# Patient Record
Sex: Female | Born: 1940 | Race: White | Hispanic: No | Smoking: Never smoker
Health system: Southern US, Community
[De-identification: ages and names within clinical notes are randomized; demographics above are authoritative.]

## PROBLEM LIST (undated history)

## (undated) DIAGNOSIS — J189 Pneumonia, unspecified organism: Secondary | ICD-10-CM

## (undated) DIAGNOSIS — J42 Unspecified chronic bronchitis: Secondary | ICD-10-CM

## (undated) DIAGNOSIS — M1990 Unspecified osteoarthritis, unspecified site: Secondary | ICD-10-CM

## (undated) DIAGNOSIS — E7800 Pure hypercholesterolemia, unspecified: Secondary | ICD-10-CM

## (undated) DIAGNOSIS — J45909 Unspecified asthma, uncomplicated: Secondary | ICD-10-CM

## (undated) DIAGNOSIS — K219 Gastro-esophageal reflux disease without esophagitis: Secondary | ICD-10-CM

## (undated) DIAGNOSIS — J3089 Other allergic rhinitis: Secondary | ICD-10-CM

## (undated) DIAGNOSIS — E119 Type 2 diabetes mellitus without complications: Secondary | ICD-10-CM

## (undated) DIAGNOSIS — I10 Essential (primary) hypertension: Secondary | ICD-10-CM

## (undated) HISTORY — PX: BLADDER SUSPENSION: SHX72

## (undated) HISTORY — PX: COLONOSCOPY: SHX174

## (undated) HISTORY — PX: JOINT REPLACEMENT: SHX530

## (undated) HISTORY — PX: ABDOMINAL HYSTERECTOMY: SHX81

## (undated) HISTORY — PX: SHOULDER ARTHROSCOPY W/ ROTATOR CUFF REPAIR: SHX2400

---

## 1998-06-30 ENCOUNTER — Ambulatory Visit (HOSPITAL_COMMUNITY): Admission: RE | Admit: 1998-06-30 | Discharge: 1998-06-30 | Payer: Self-pay | Admitting: Family Medicine

## 2000-07-04 ENCOUNTER — Encounter: Payer: Self-pay | Admitting: Family Medicine

## 2000-07-04 ENCOUNTER — Ambulatory Visit (HOSPITAL_COMMUNITY): Admission: RE | Admit: 2000-07-04 | Discharge: 2000-07-04 | Payer: Self-pay | Admitting: Family Medicine

## 2000-07-12 ENCOUNTER — Encounter: Admission: RE | Admit: 2000-07-12 | Discharge: 2000-07-12 | Payer: Self-pay | Admitting: Family Medicine

## 2000-07-12 ENCOUNTER — Encounter: Payer: Self-pay | Admitting: Family Medicine

## 2000-08-22 ENCOUNTER — Encounter: Payer: Self-pay | Admitting: Obstetrics and Gynecology

## 2000-08-22 ENCOUNTER — Ambulatory Visit (HOSPITAL_COMMUNITY): Admission: RE | Admit: 2000-08-22 | Discharge: 2000-08-22 | Payer: Self-pay | Admitting: Obstetrics and Gynecology

## 2001-04-12 ENCOUNTER — Emergency Department (HOSPITAL_COMMUNITY): Admission: EM | Admit: 2001-04-12 | Discharge: 2001-04-12 | Payer: Self-pay | Admitting: Emergency Medicine

## 2001-05-21 ENCOUNTER — Encounter: Payer: Self-pay | Admitting: Family Medicine

## 2001-05-21 ENCOUNTER — Ambulatory Visit (HOSPITAL_COMMUNITY): Admission: RE | Admit: 2001-05-21 | Discharge: 2001-05-21 | Payer: Self-pay | Admitting: Family Medicine

## 2001-09-12 ENCOUNTER — Encounter: Payer: Self-pay | Admitting: Family Medicine

## 2001-09-12 ENCOUNTER — Ambulatory Visit (HOSPITAL_COMMUNITY): Admission: RE | Admit: 2001-09-12 | Discharge: 2001-09-12 | Payer: Self-pay | Admitting: Family Medicine

## 2002-02-25 ENCOUNTER — Ambulatory Visit (HOSPITAL_COMMUNITY): Admission: RE | Admit: 2002-02-25 | Discharge: 2002-02-25 | Payer: Self-pay | Admitting: Family Medicine

## 2002-02-25 ENCOUNTER — Encounter: Payer: Self-pay | Admitting: Family Medicine

## 2002-10-07 ENCOUNTER — Ambulatory Visit (HOSPITAL_BASED_OUTPATIENT_CLINIC_OR_DEPARTMENT_OTHER): Admission: RE | Admit: 2002-10-07 | Discharge: 2002-10-07 | Payer: Self-pay | Admitting: Orthopaedic Surgery

## 2003-11-10 ENCOUNTER — Other Ambulatory Visit: Admission: RE | Admit: 2003-11-10 | Discharge: 2003-11-10 | Payer: Self-pay | Admitting: Family Medicine

## 2003-12-23 ENCOUNTER — Encounter: Admission: RE | Admit: 2003-12-23 | Discharge: 2003-12-23 | Payer: Self-pay | Admitting: Family Medicine

## 2004-01-06 ENCOUNTER — Ambulatory Visit (HOSPITAL_COMMUNITY): Admission: RE | Admit: 2004-01-06 | Discharge: 2004-01-06 | Payer: Self-pay | Admitting: Gastroenterology

## 2004-01-06 ENCOUNTER — Encounter (INDEPENDENT_AMBULATORY_CARE_PROVIDER_SITE_OTHER): Payer: Self-pay | Admitting: *Deleted

## 2004-10-30 ENCOUNTER — Ambulatory Visit (HOSPITAL_COMMUNITY): Admission: RE | Admit: 2004-10-30 | Discharge: 2004-10-30 | Payer: Self-pay | Admitting: Allergy

## 2004-10-30 IMAGING — CR DG CHEST 2V
2 series · 2 of 2 positions shown · non-contrast
Comparison: Report dated 09/12/2001.

CLINICAL DATA: Cough, shortness of breath, hypertension.

CHEST - 2 VIEW

[view not recorded (1 of 2)]
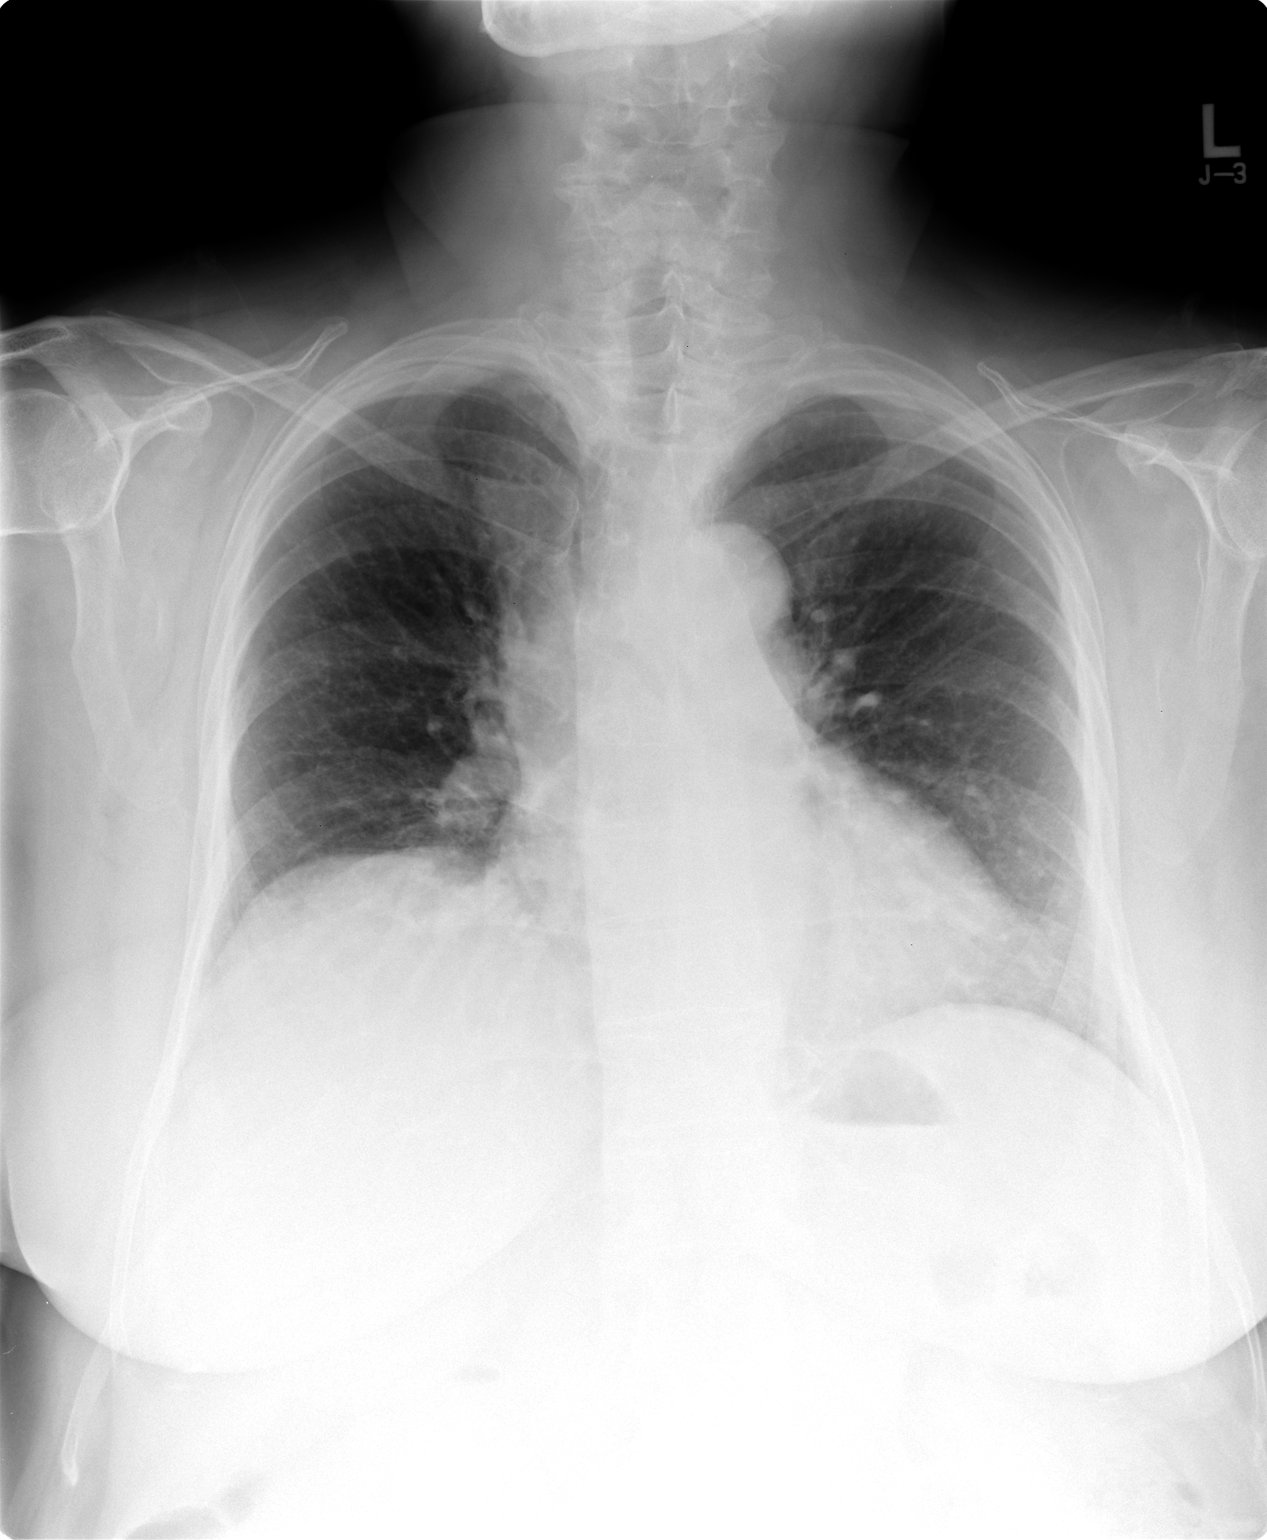

[view not recorded (2 of 2)]
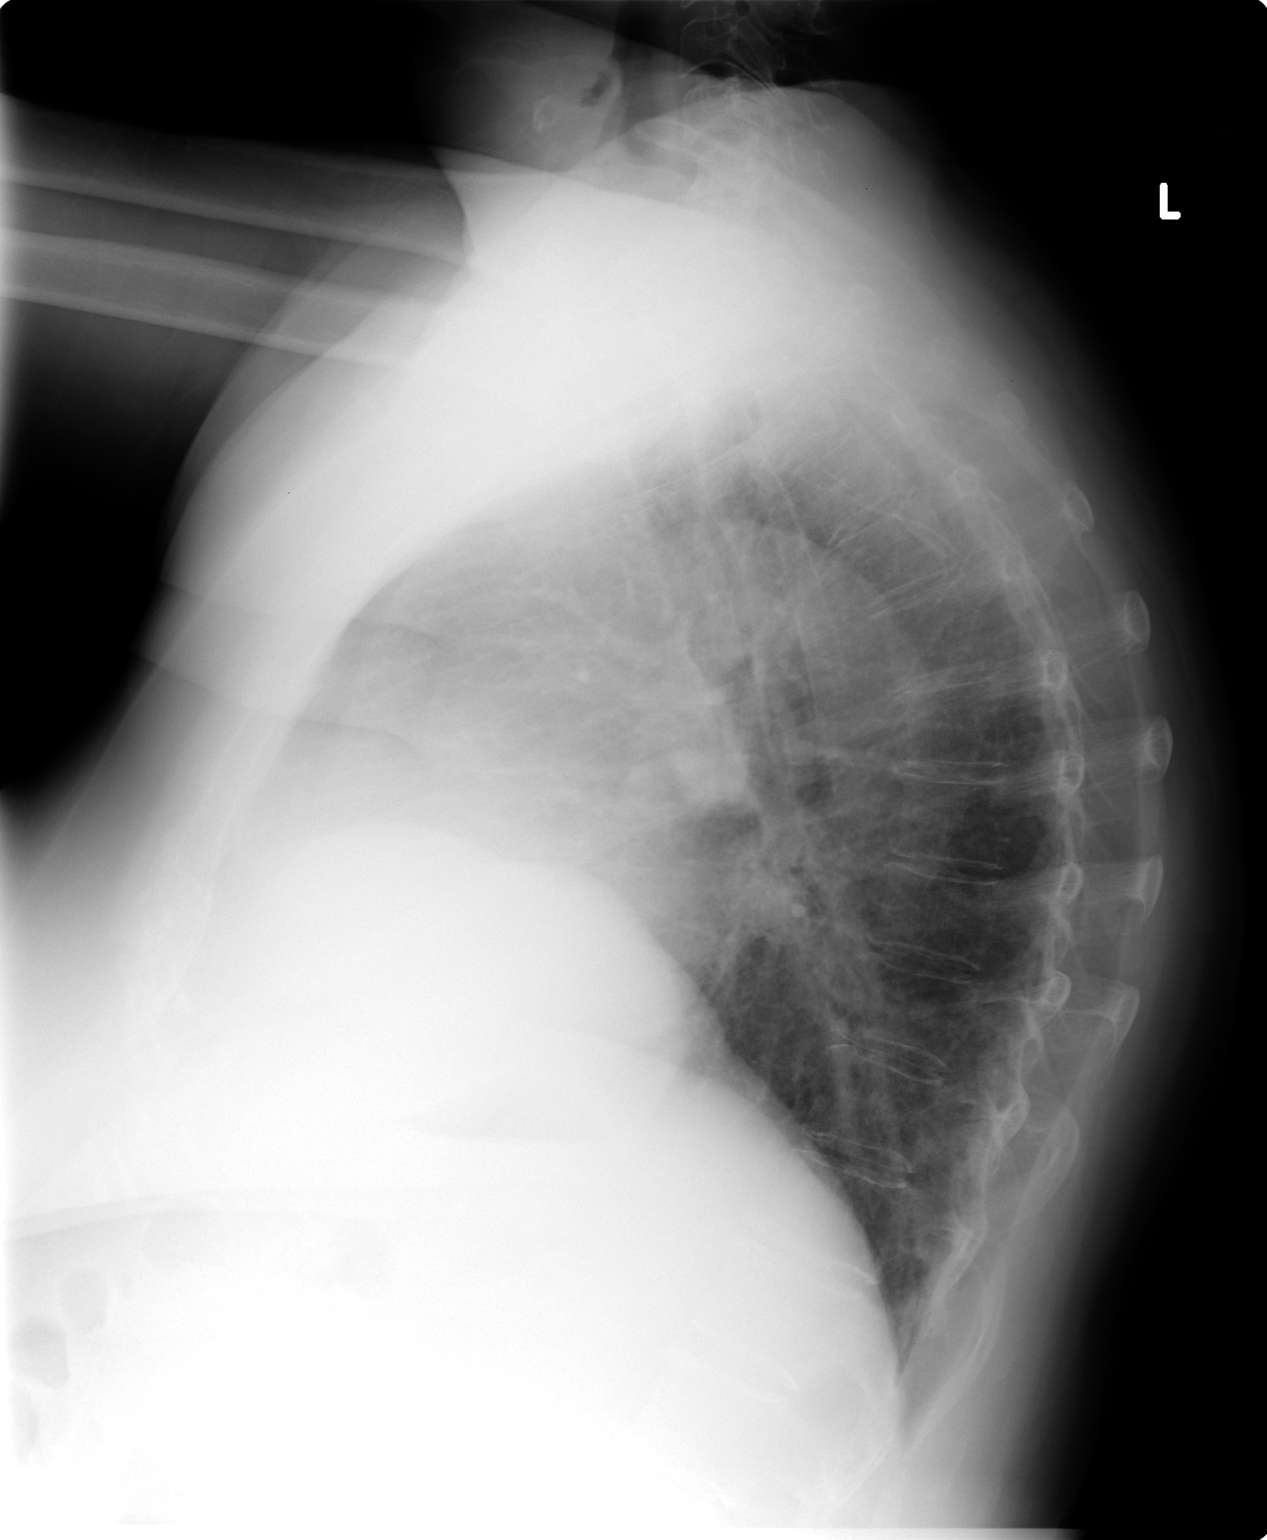

[2 of 2 positions shown; findings below may reference images not displayed]

FINDINGS: Mildly enlarged cardiac silhouette. Right anterior diaphragmatic
eventration. Diffuse peribronchial thickening and accentuation of the
interstitial markings. Diffuse osteopenia.

IMPRESSION

1. Mild cardiomegaly.

2. Mild bronchitic changes and mild chronic interstitial lung disease.

## 2004-11-15 ENCOUNTER — Emergency Department (HOSPITAL_COMMUNITY): Admission: EM | Admit: 2004-11-15 | Discharge: 2004-11-15 | Payer: Self-pay | Admitting: Emergency Medicine

## 2004-11-15 IMAGING — CR DG CHEST 2V
2 series · 2 of 2 positions shown · non-contrast
Comparison: 10/30/04.

CLINICAL DATA: Chest pain radiating to left arm.  Hypertension.  Shortness of breath.
 CHEST ? TWO VIEW:

[view not recorded (1 of 2)]
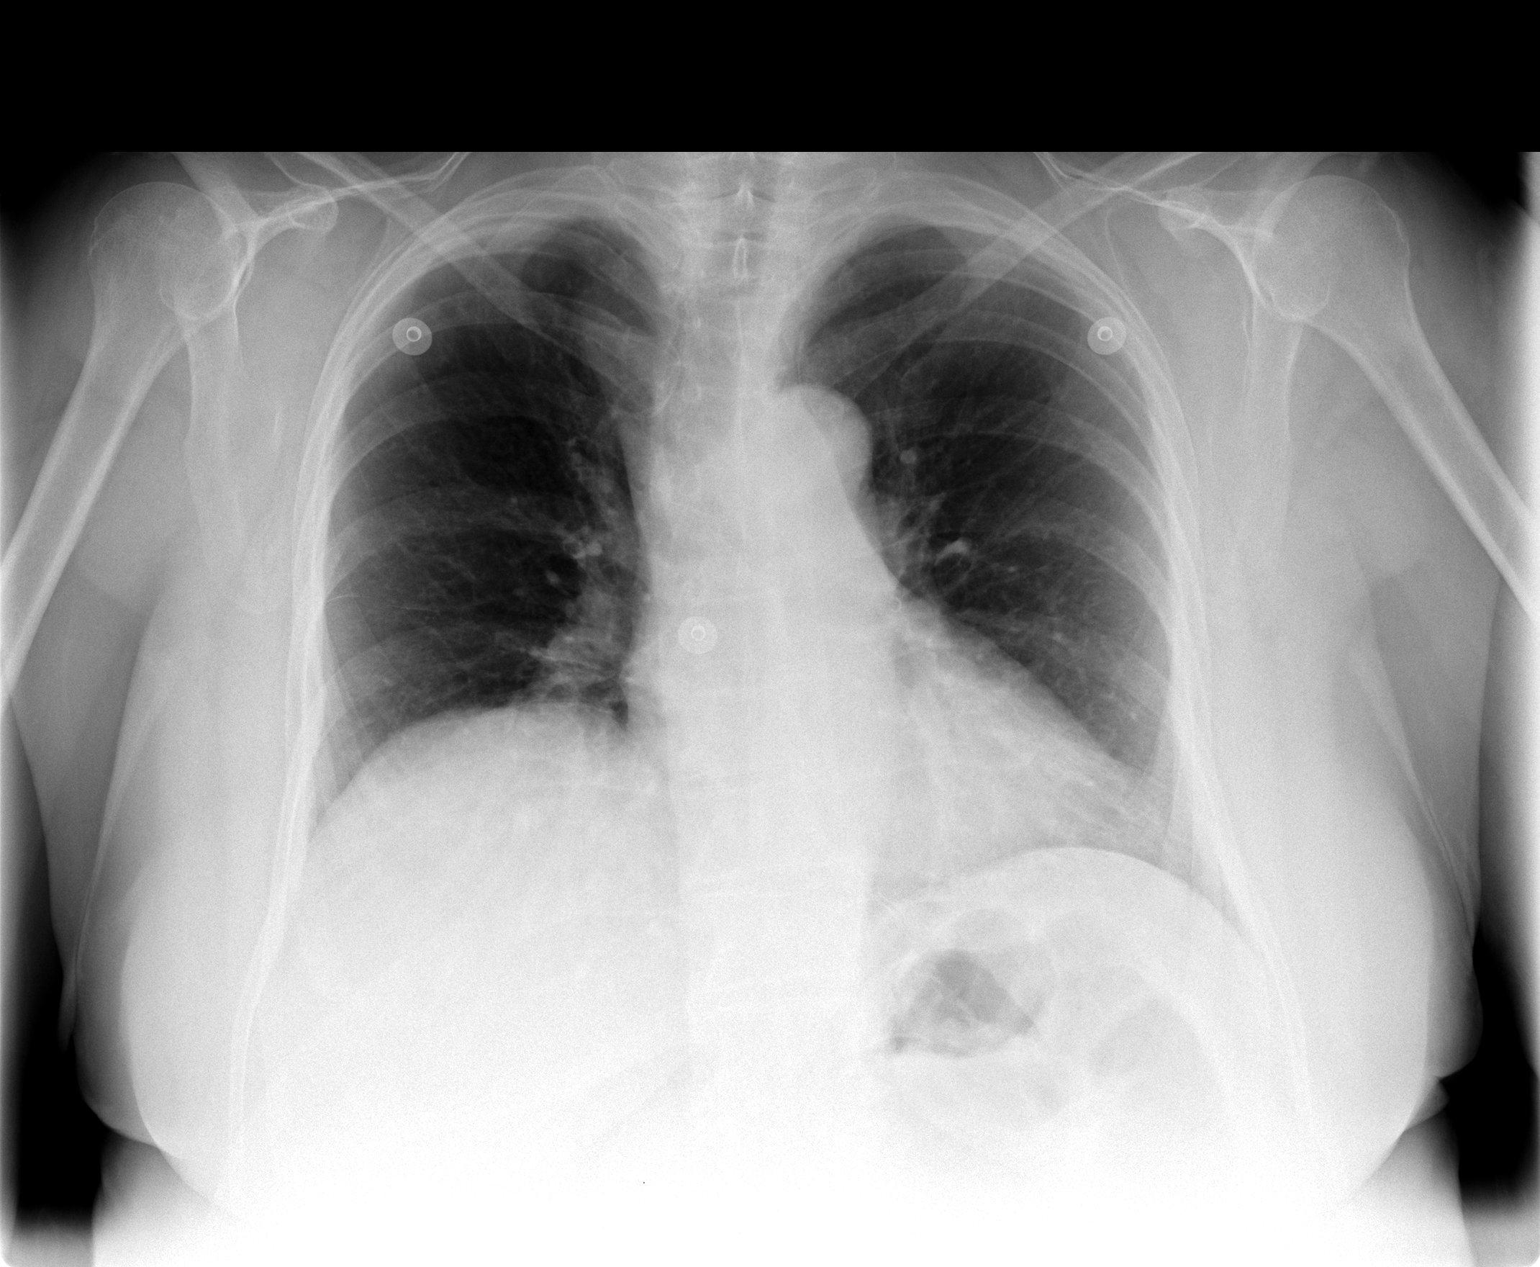

[view not recorded (2 of 2)]
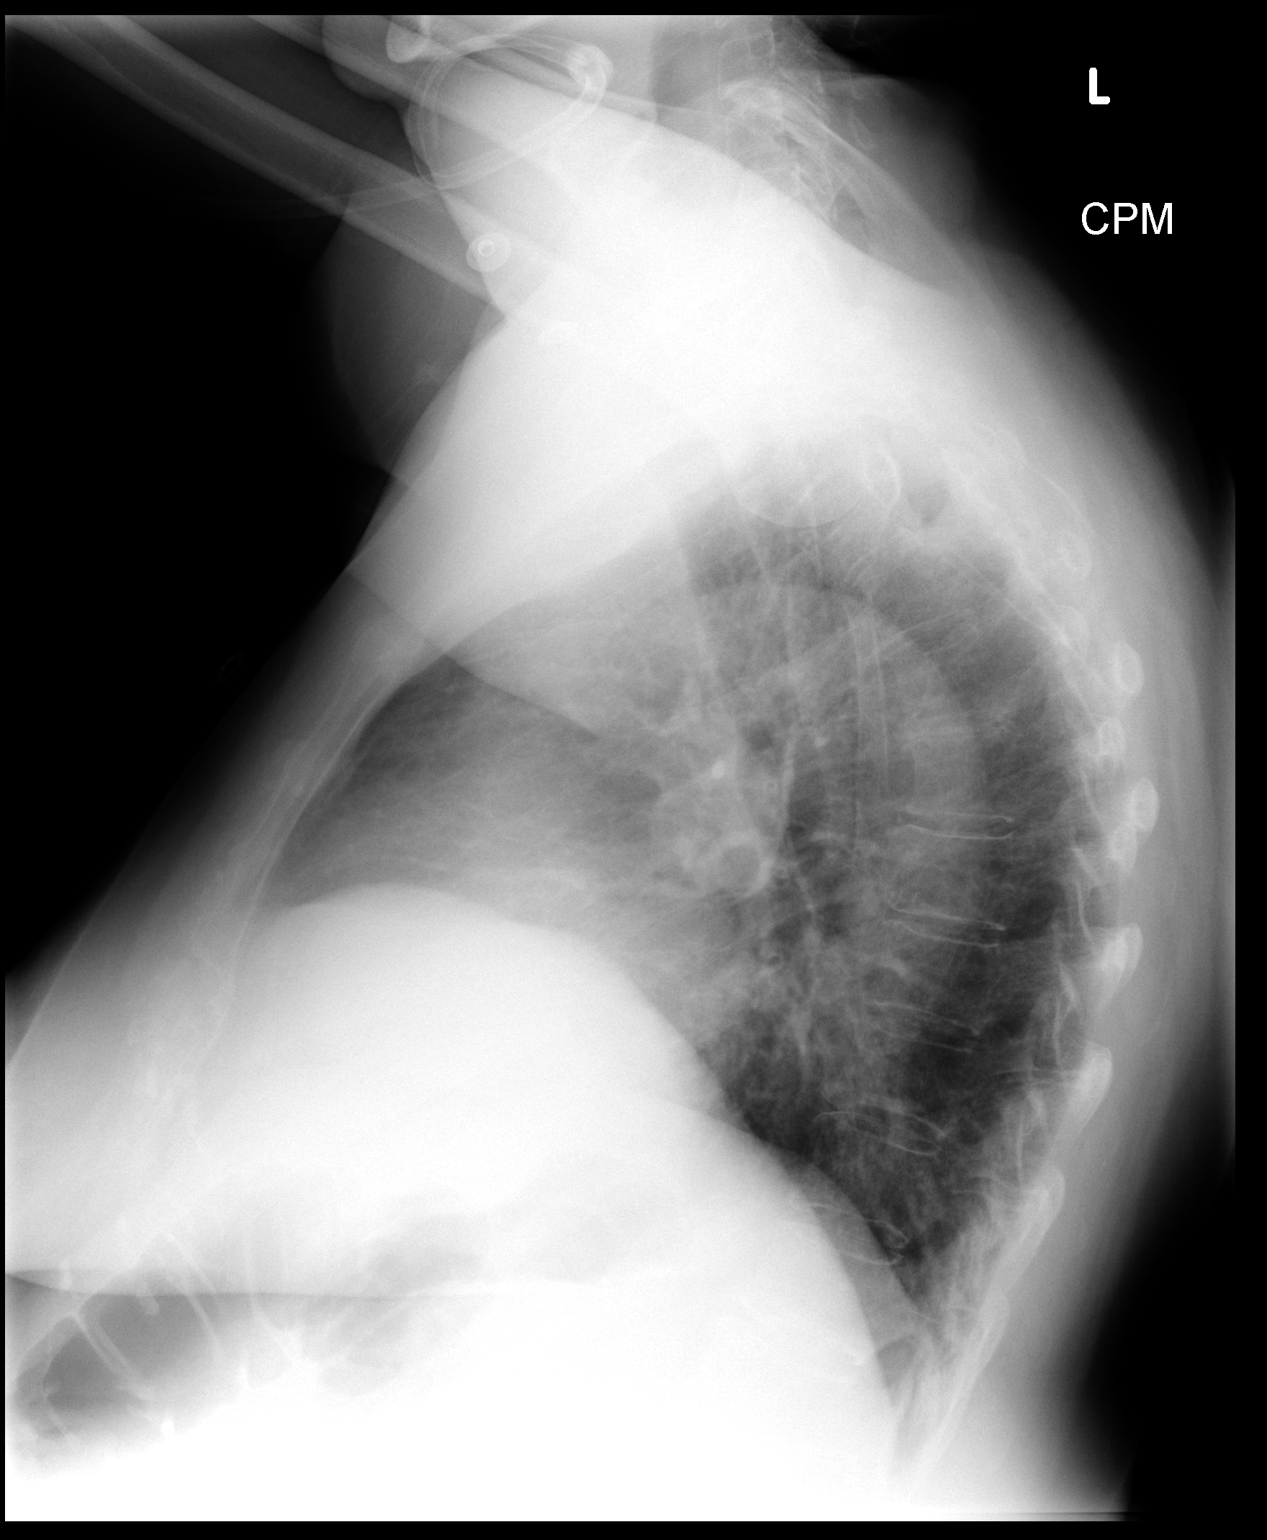

[2 of 2 positions shown; findings below may reference images not displayed]

Low lung volumes are seen, however both lungs are clear.  Heart size and mediastinal contours are within normal limits.  No significant change is seen since prior study.
IMPRESSION: Stable chest.  No active disease.

## 2004-11-28 ENCOUNTER — Ambulatory Visit: Payer: Self-pay | Admitting: Internal Medicine

## 2004-12-06 ENCOUNTER — Ambulatory Visit: Payer: Self-pay | Admitting: Internal Medicine

## 2005-01-17 ENCOUNTER — Ambulatory Visit (HOSPITAL_COMMUNITY): Admission: RE | Admit: 2005-01-17 | Discharge: 2005-01-17 | Payer: Self-pay | Admitting: Internal Medicine

## 2005-01-17 ENCOUNTER — Ambulatory Visit: Payer: Self-pay | Admitting: Internal Medicine

## 2005-05-05 ENCOUNTER — Other Ambulatory Visit: Admission: RE | Admit: 2005-05-05 | Discharge: 2005-05-05 | Payer: Self-pay | Admitting: Family Medicine

## 2005-06-18 ENCOUNTER — Emergency Department (HOSPITAL_COMMUNITY): Admission: EM | Admit: 2005-06-18 | Discharge: 2005-06-18 | Payer: Self-pay | Admitting: Emergency Medicine

## 2005-06-18 IMAGING — CR DG CHEST 1V PORT
1 series · 1 of 1 positions shown · non-contrast
Comparison: none

HISTORY: Dyspnea

PORTABLE CHEST ONE VIEW:
Portable exam 2121 hours compared to 11/15/2004
Mild cardiac enlargement.
Normal mediastinal contours and vascularity.
Mild chronic bronchitic changes.
No infiltrate, failure, or effusion.
Bones unremarkable.
No pneumothorax.

[view not recorded]
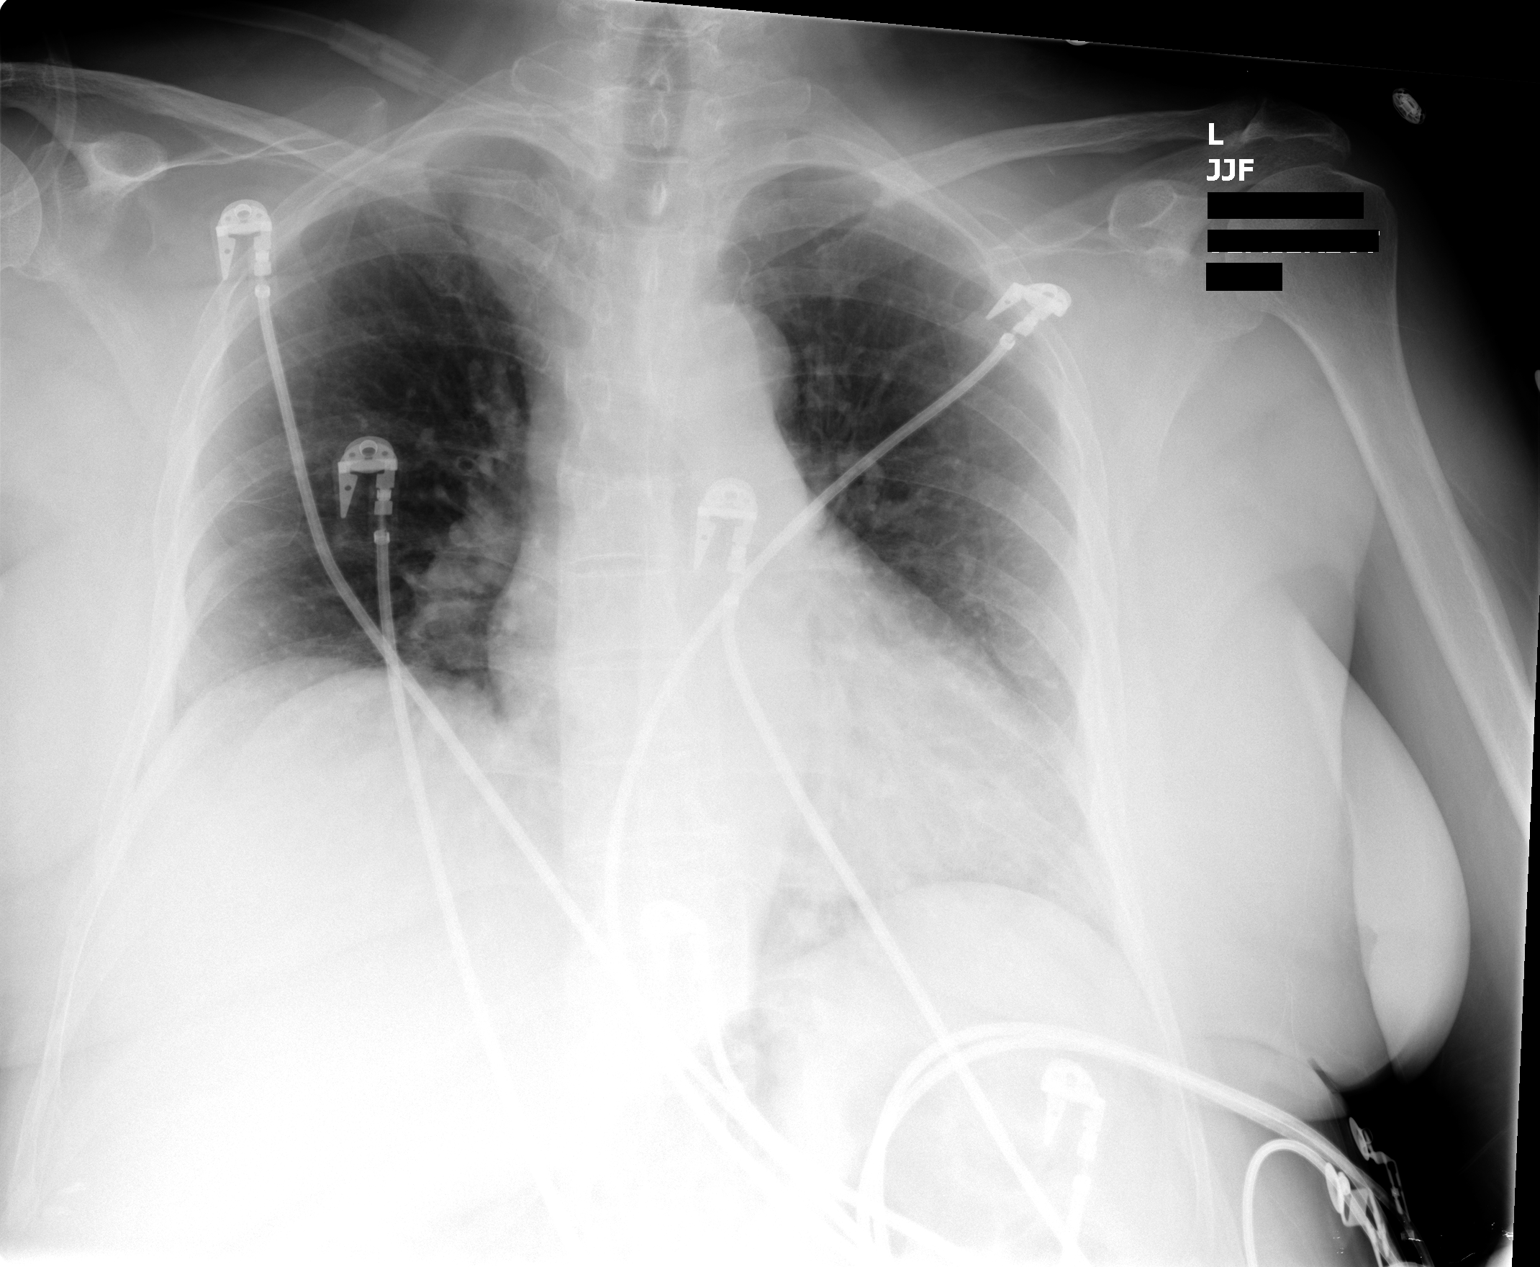

[1 of 1 positions shown; findings below may reference images not displayed]

IMPRESSION: Cardiomegaly with mild chronic bronchitic changes.

## 2005-06-20 ENCOUNTER — Ambulatory Visit: Payer: Self-pay | Admitting: Internal Medicine

## 2005-06-21 ENCOUNTER — Ambulatory Visit: Payer: Self-pay | Admitting: Cardiology

## 2005-06-21 IMAGING — CT CT PARANASAL SINUSES LIMITED
1 series · 16 of 18 positions shown, 20 images · non-contrast
Comparison: none

CLINICAL DATA: Cough, headache.
LIMITED CT OF PARANASAL SINUSES:
TECHNIQUE: oronal CT images were obtained through the paranasal sinuses without intravenous contrast.

[Series 3: ltdsinus 3.0 h30s · axial · 0.26mm/px · z∈[-131,-41]mm · 16 of 18 slices shown, 20 images]
[im 2/18  brain]
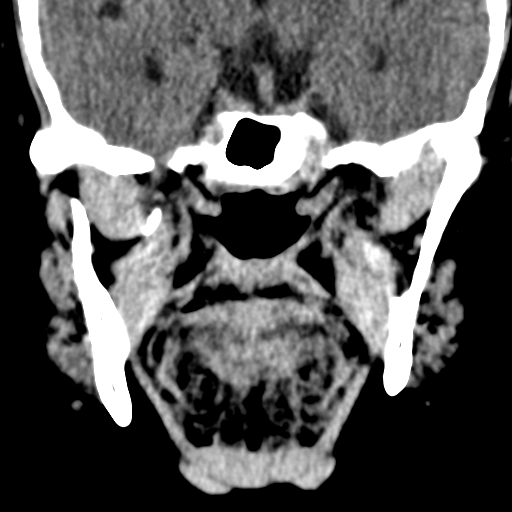
[im 2/18  bone]
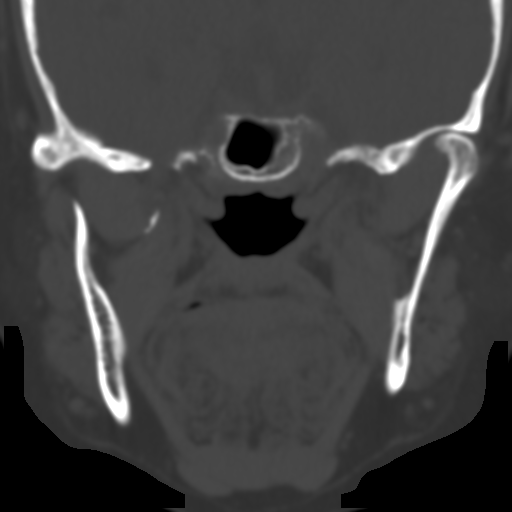
[im 3/18  bone]
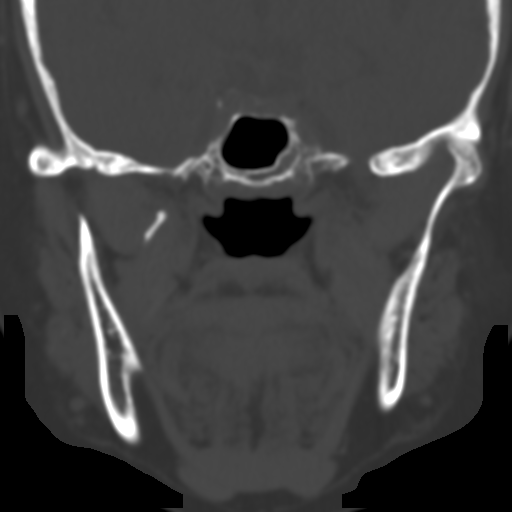
[im 4/18  bone]
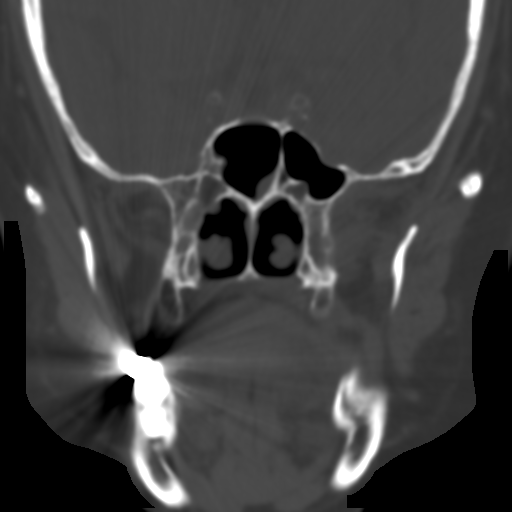
[im 5/18  bone]
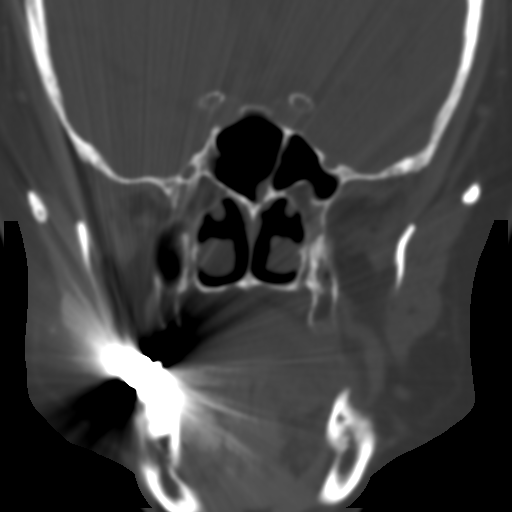
[im 6/18  brain]
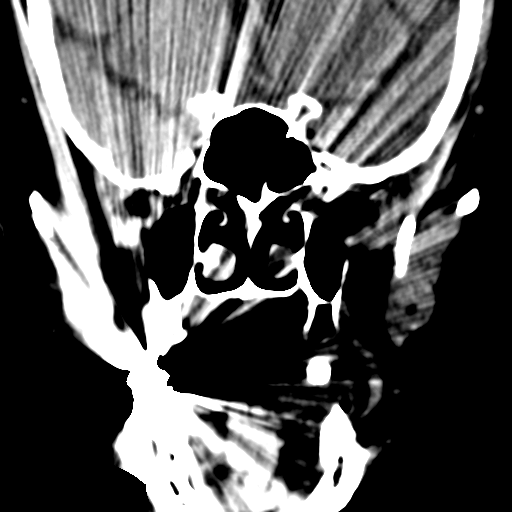
[im 6/18  bone]
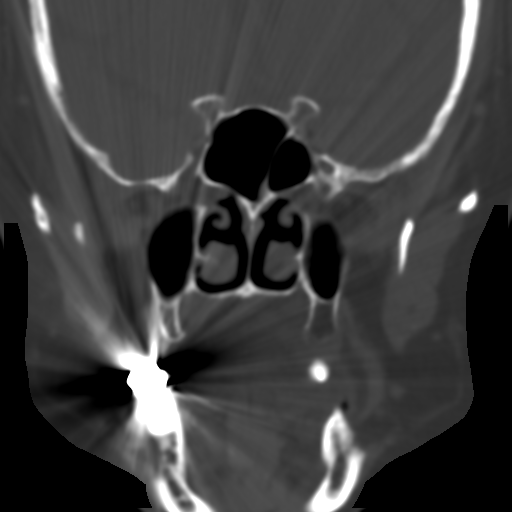
[im 7/18  bone]
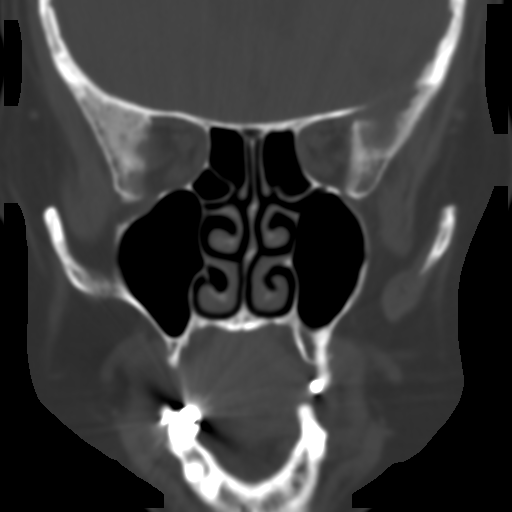
[im 8/18  bone]
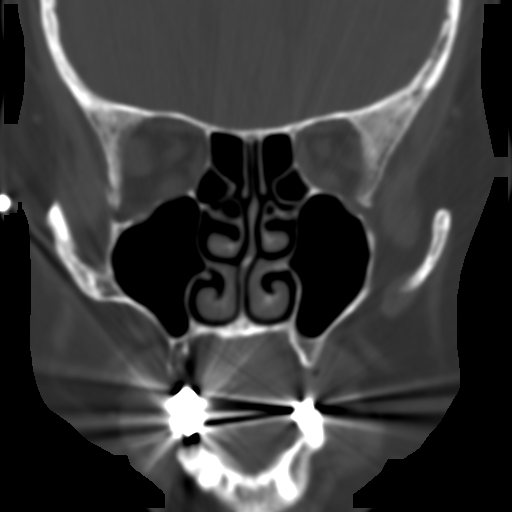
[im 9/18  bone]
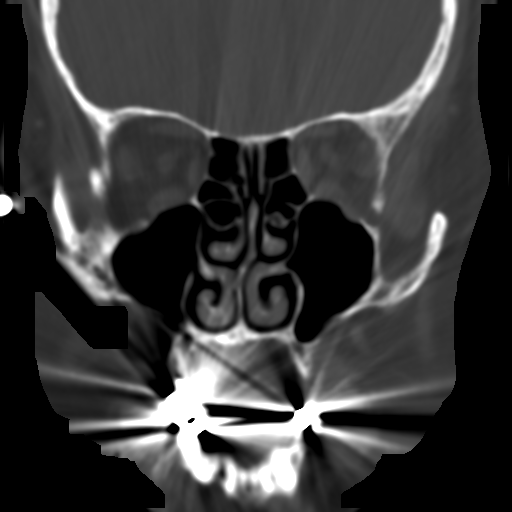
[im 10/18  brain]
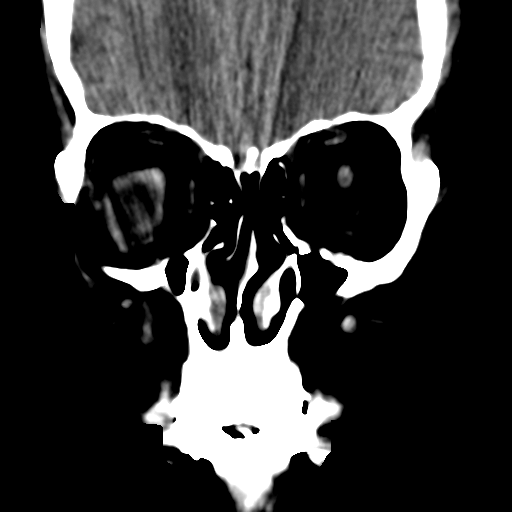
[im 10/18  bone]
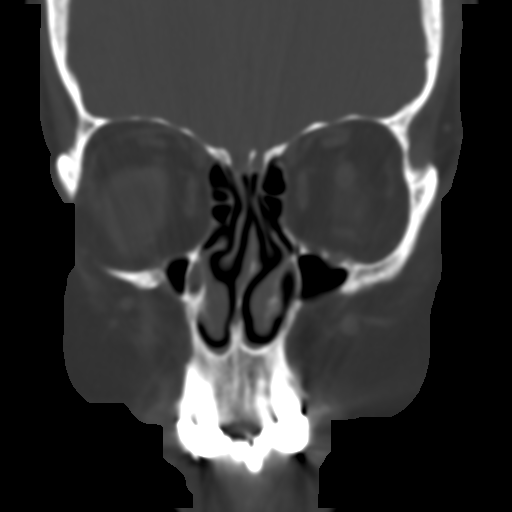
[im 11/18  bone]
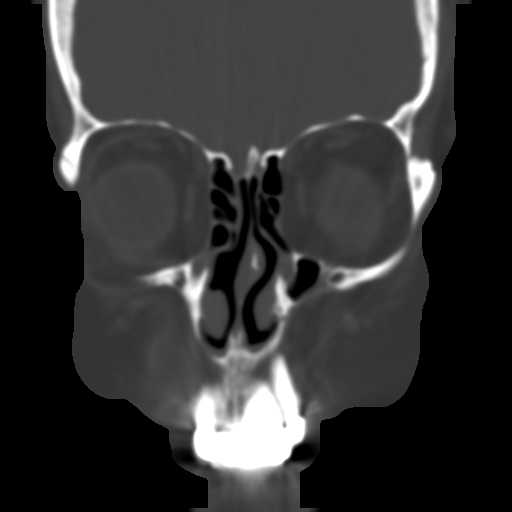
[im 12/18  bone]
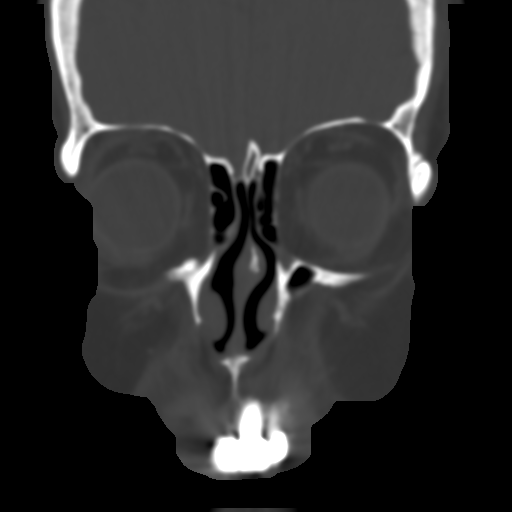
[im 13/18  bone]
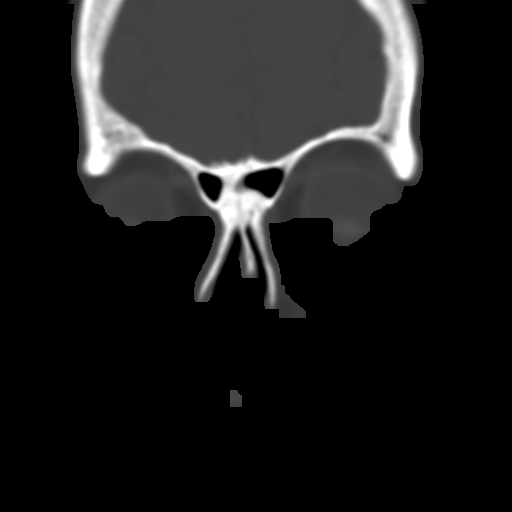
[im 14/18  brain]
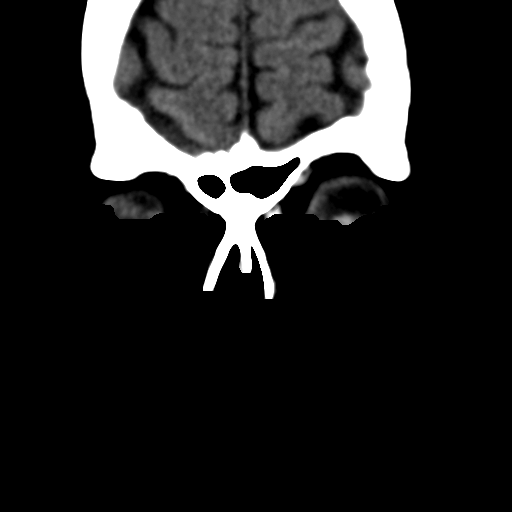
[im 14/18  bone]
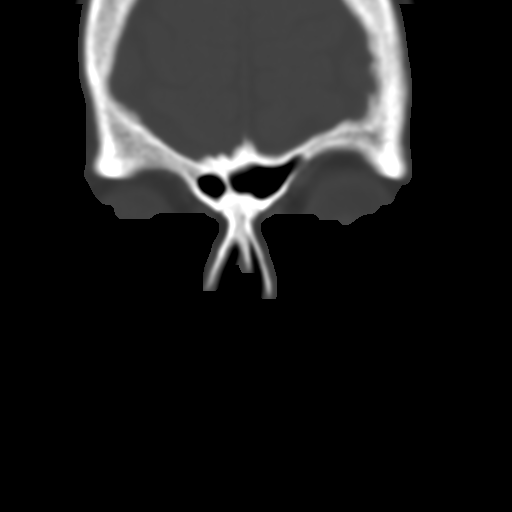
[im 15/18  bone]
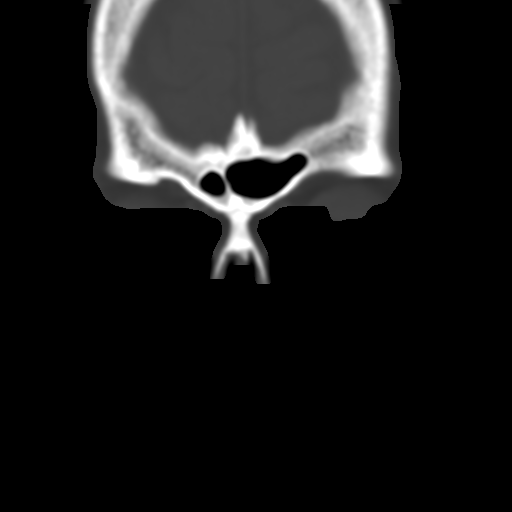
[im 16/18  bone]
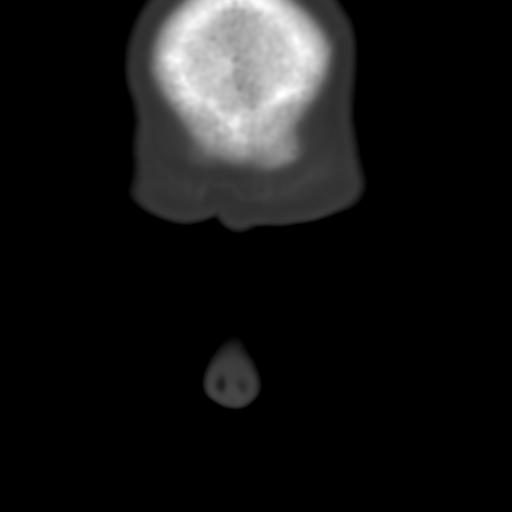
[im 17/18  bone]
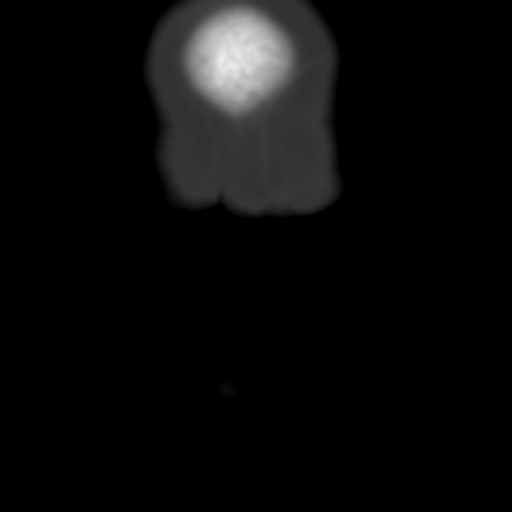

[16 of 18 positions shown; findings below may reference images not displayed]

FINDINGS: Mild focal mucosal thickening of the sphenoid sinus.  No paranasal sinus opacification or air-fluid level.  Intact bony sinus walls.  Nasal cavity unremarkable.  Turbinates symmetrical.  Slight nasal septal deviation.  Ostiomeatal units grossly patent.  Small frontal sinuses.
IMPRESSION: Slight sphenoid sinus mucosal thickening.  See comments above.

## 2005-06-26 ENCOUNTER — Ambulatory Visit: Payer: Self-pay | Admitting: Pulmonary Disease

## 2005-07-03 ENCOUNTER — Ambulatory Visit: Payer: Self-pay | Admitting: Internal Medicine

## 2005-07-03 ENCOUNTER — Ambulatory Visit: Admission: RE | Admit: 2005-07-03 | Discharge: 2005-07-03 | Payer: Self-pay | Admitting: Internal Medicine

## 2005-07-11 ENCOUNTER — Ambulatory Visit: Payer: Self-pay | Admitting: Internal Medicine

## 2005-07-18 ENCOUNTER — Ambulatory Visit: Payer: Self-pay | Admitting: Internal Medicine

## 2005-08-03 ENCOUNTER — Ambulatory Visit: Payer: Self-pay | Admitting: Internal Medicine

## 2005-08-21 ENCOUNTER — Ambulatory Visit: Payer: Self-pay | Admitting: Internal Medicine

## 2005-09-04 ENCOUNTER — Ambulatory Visit: Payer: Self-pay | Admitting: Internal Medicine

## 2005-09-19 ENCOUNTER — Ambulatory Visit: Payer: Self-pay | Admitting: Pulmonary Disease

## 2005-09-26 ENCOUNTER — Ambulatory Visit (HOSPITAL_COMMUNITY): Admission: RE | Admit: 2005-09-26 | Discharge: 2005-09-26 | Payer: Self-pay | Admitting: Internal Medicine

## 2005-10-17 ENCOUNTER — Ambulatory Visit: Payer: Self-pay | Admitting: Internal Medicine

## 2005-11-16 ENCOUNTER — Ambulatory Visit: Payer: Self-pay | Admitting: Internal Medicine

## 2005-12-07 ENCOUNTER — Ambulatory Visit: Payer: Self-pay | Admitting: Internal Medicine

## 2005-12-29 ENCOUNTER — Ambulatory Visit: Payer: Self-pay | Admitting: Internal Medicine

## 2006-01-30 ENCOUNTER — Ambulatory Visit: Payer: Self-pay | Admitting: Internal Medicine

## 2006-03-30 ENCOUNTER — Ambulatory Visit: Payer: Self-pay | Admitting: Internal Medicine

## 2006-05-07 ENCOUNTER — Other Ambulatory Visit: Admission: RE | Admit: 2006-05-07 | Discharge: 2006-05-07 | Payer: Self-pay | Admitting: Family Medicine

## 2006-05-30 ENCOUNTER — Encounter: Admission: RE | Admit: 2006-05-30 | Discharge: 2006-05-30 | Payer: Self-pay | Admitting: Family Medicine

## 2007-11-22 ENCOUNTER — Emergency Department (HOSPITAL_COMMUNITY): Admission: EM | Admit: 2007-11-22 | Discharge: 2007-11-22 | Payer: Self-pay | Admitting: Emergency Medicine

## 2007-11-22 IMAGING — CR DG CHEST 2V
2 series · 2 of 2 positions shown · non-contrast
Comparison: Chest, 11/22/07.

CLINICAL DATA: Cough and congestion.  
 CHEST - 2 VIEW:

[w chest pa]
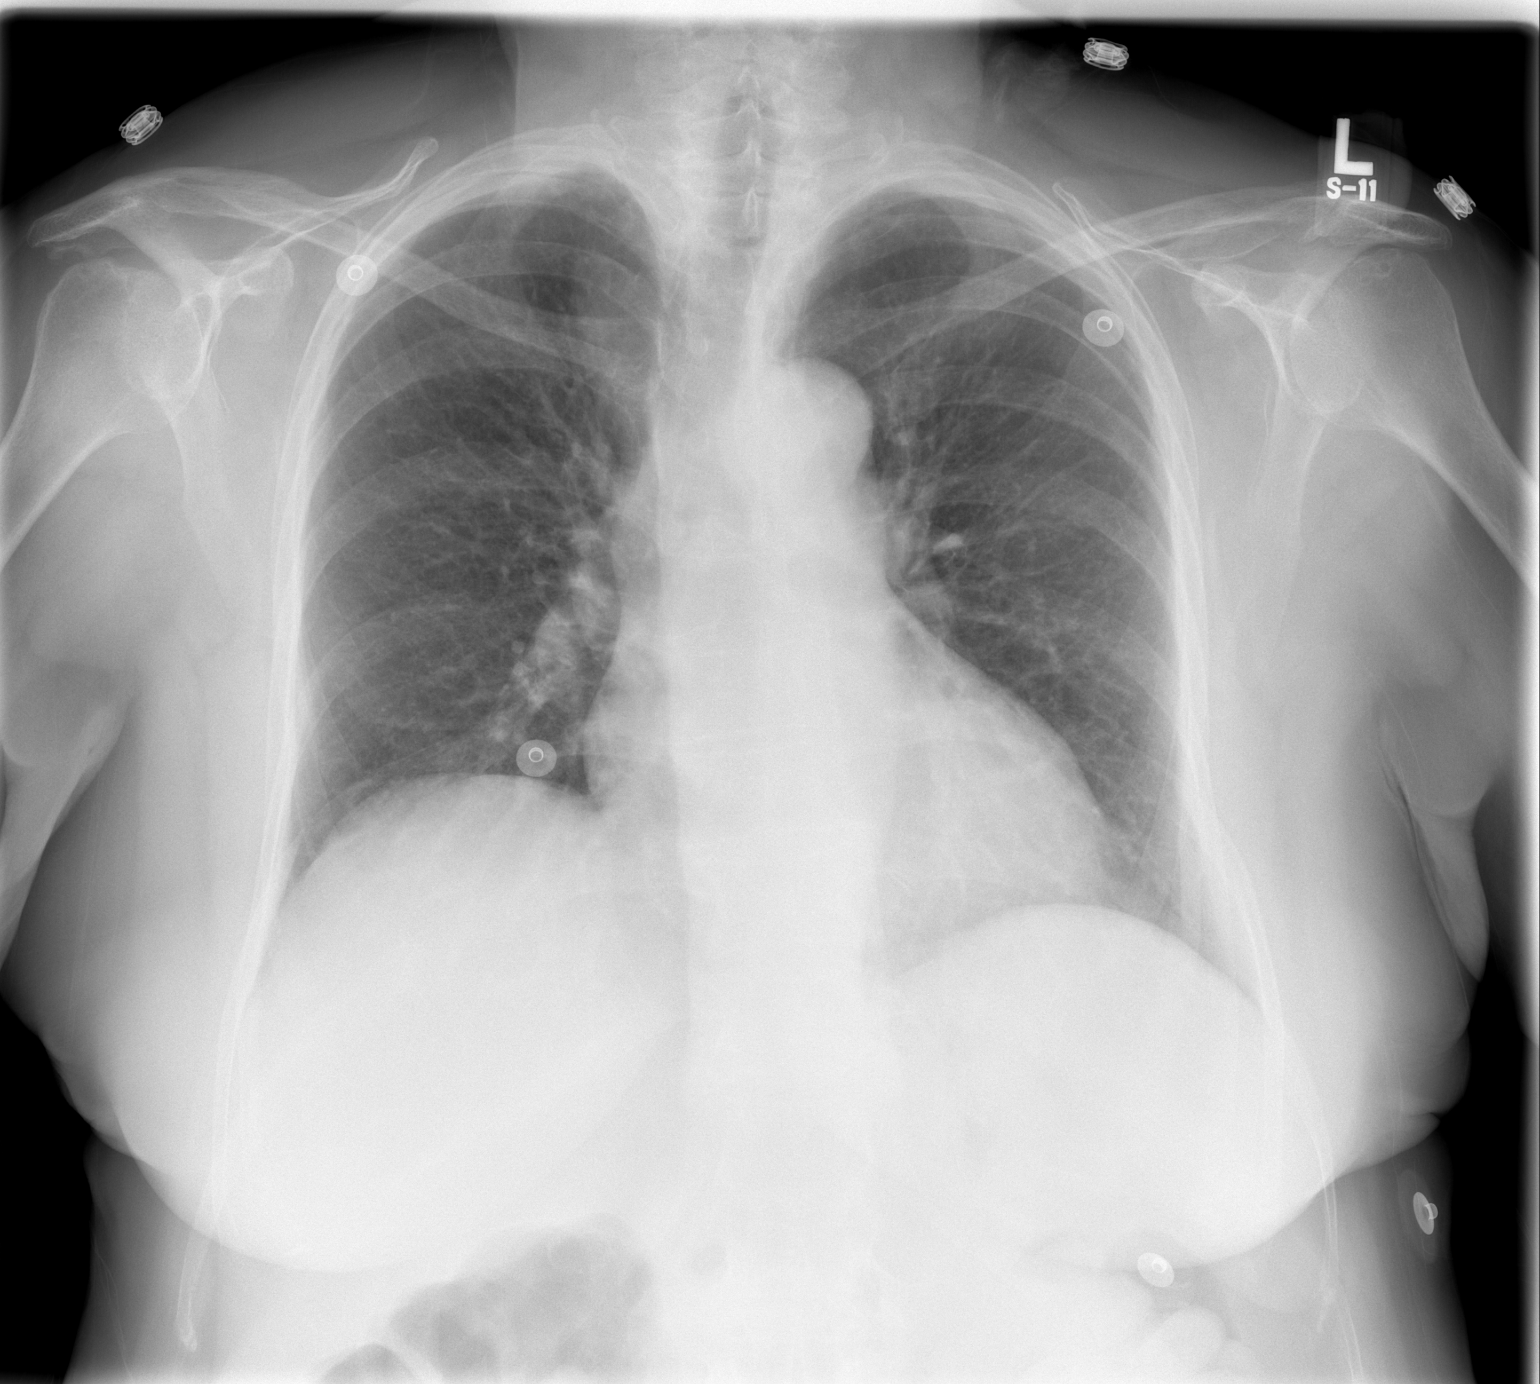

[w chest lat]
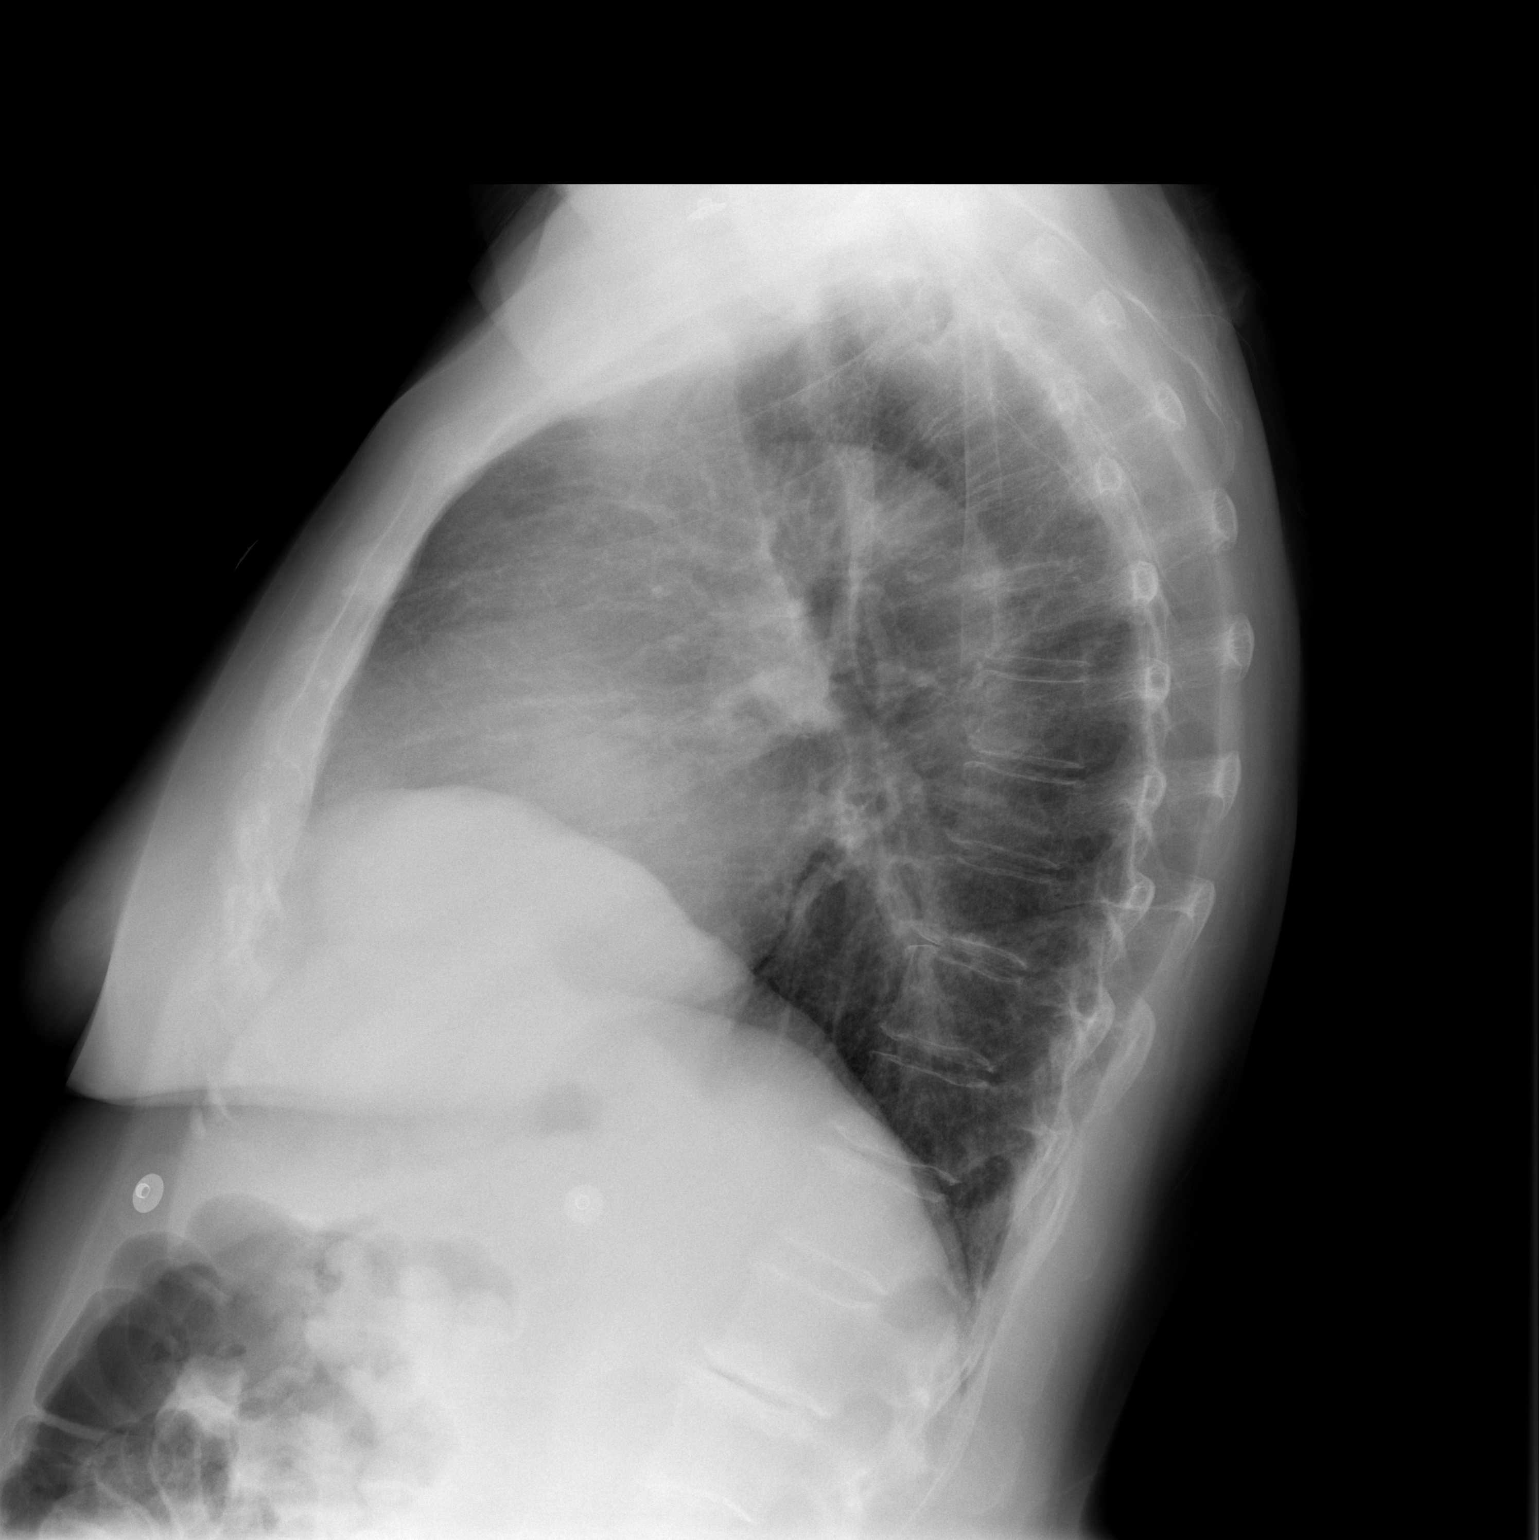

[2 of 2 positions shown; findings below may reference images not displayed]

FINDINGS: Normal mediastinum and cardiac silhouette.  Costophrenic angles are clear.  No evidence of effusion, infiltrate, or pneumothorax.  Normal pulmonary vasculature.
IMPRESSION: No acute cardiopulmonary process.

## 2008-09-23 ENCOUNTER — Encounter: Admission: RE | Admit: 2008-09-23 | Discharge: 2008-09-23 | Payer: Self-pay | Admitting: Family Medicine

## 2008-09-23 ENCOUNTER — Encounter (INDEPENDENT_AMBULATORY_CARE_PROVIDER_SITE_OTHER): Payer: Self-pay | Admitting: *Deleted

## 2008-09-23 IMAGING — US US ABDOMEN COMPLETE
1 series · 14 of 25 positions shown · non-contrast
Comparison: None

CLINICAL DATA: Abdominal pain, left back pain

ABDOMEN ULTRASOUND
TECHNIQUE: Complete abdominal ultrasound examination was performed
including evaluation of the liver, gallbladder, bile ducts,
pancreas, kidneys, spleen, IVC, and abdominal aorta.

[Series 1: us abdomen complete · 0.35mm/px · 14 of 66 slices shown]
[im 1/66]
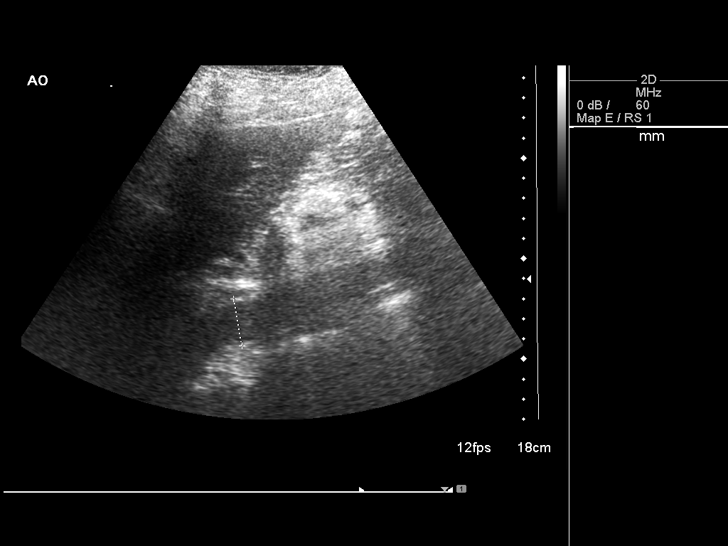
[im 6/66]
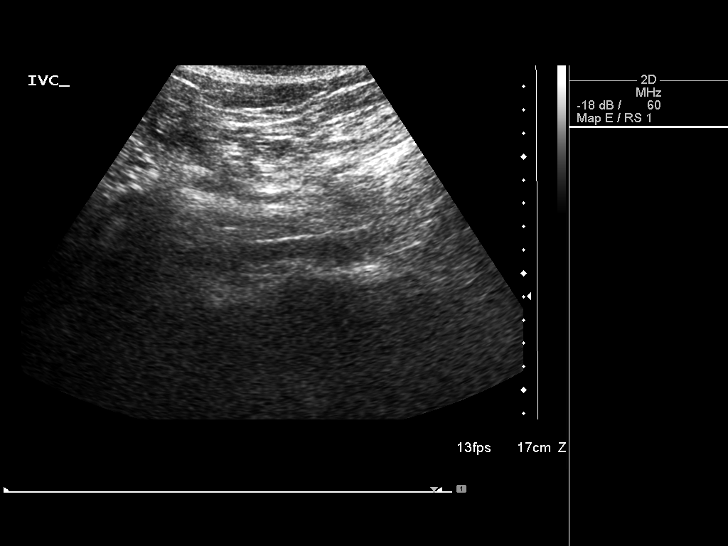
[im 11/66]
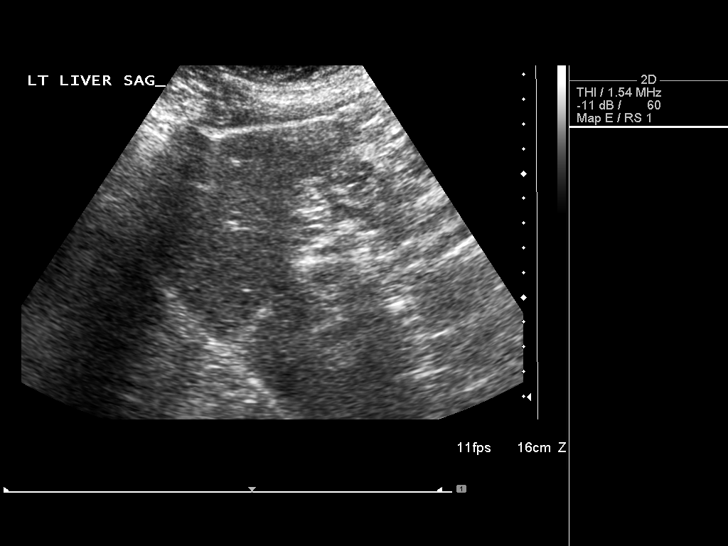
[im 17/66]
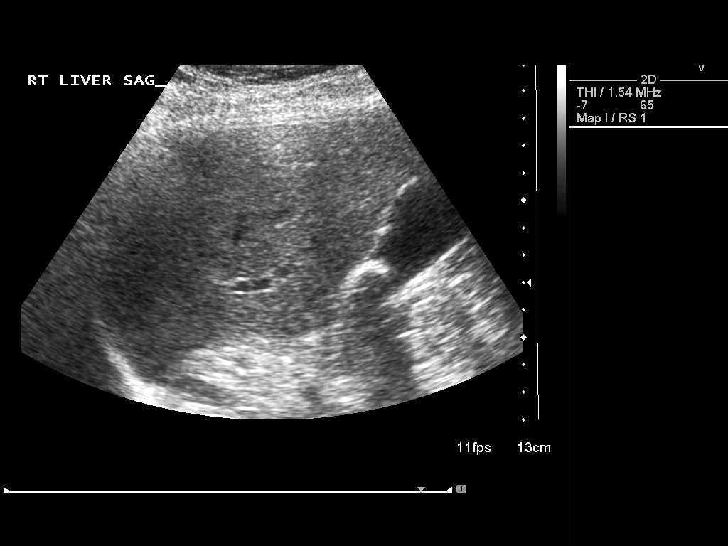
[im 22/66]
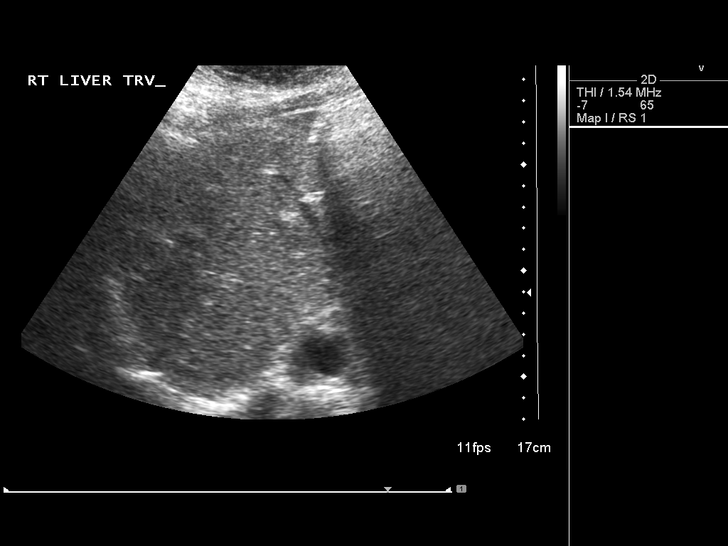
[im 25/66]
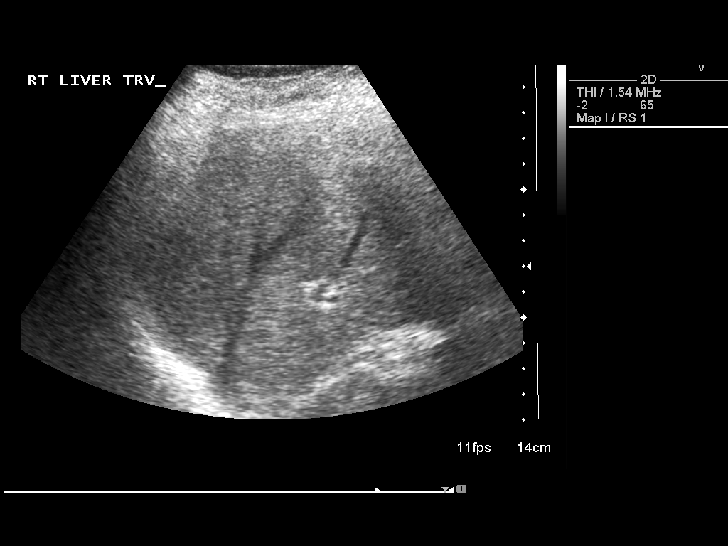
[im 30/66]
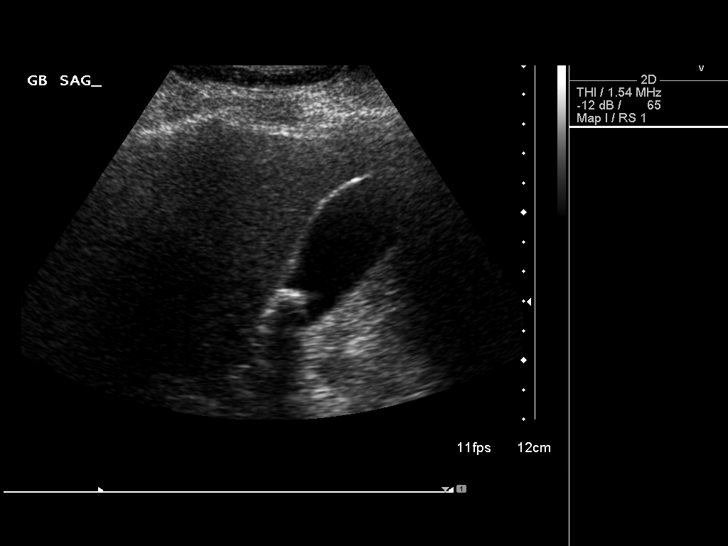
[im 36/66]
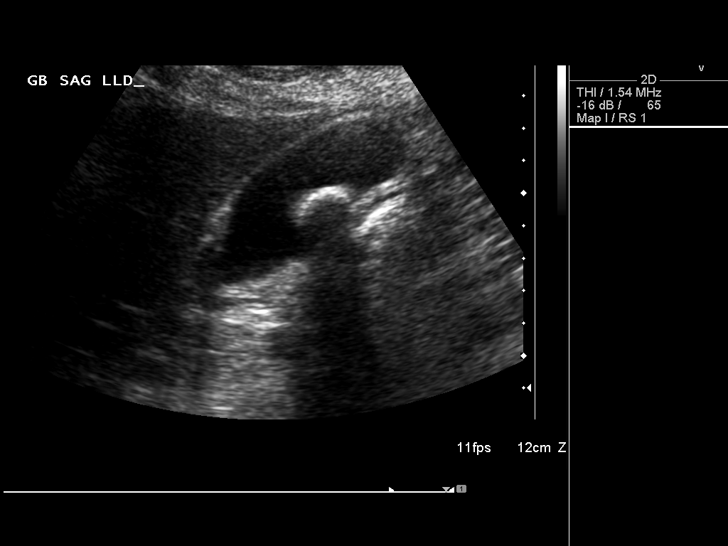
[im 41/66]
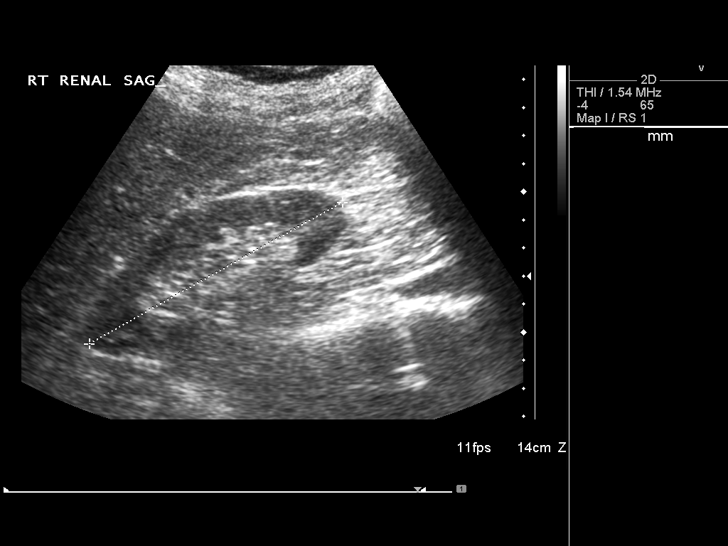
[im 44/66]
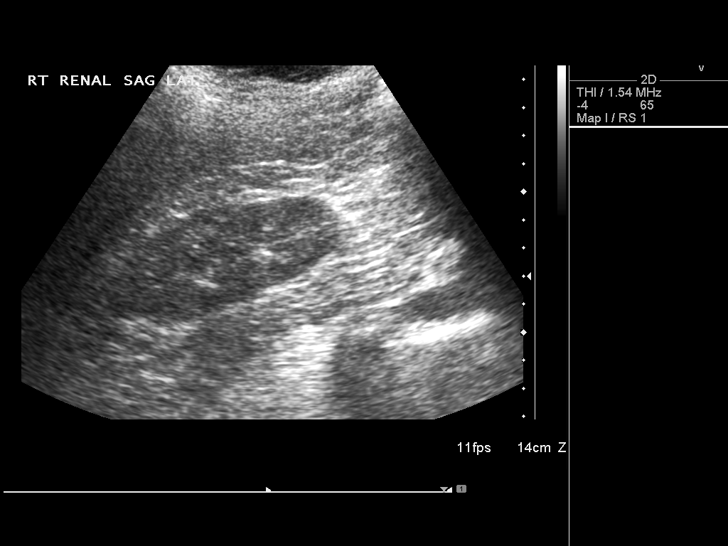
[im 49/66]
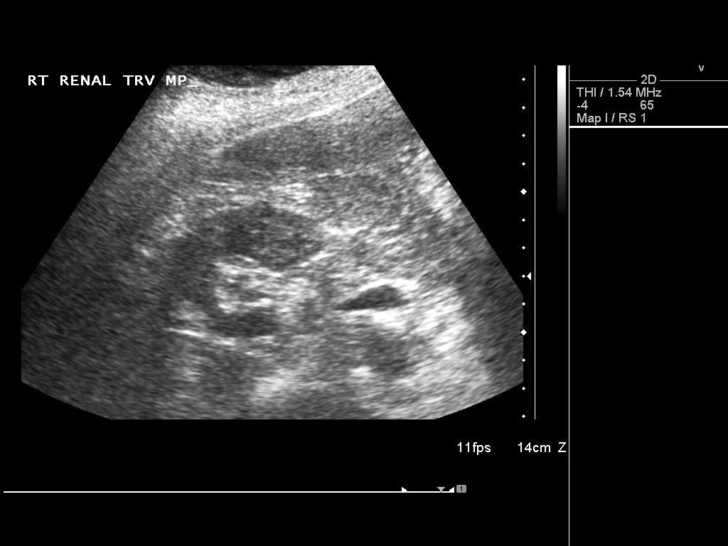
[im 55/66]
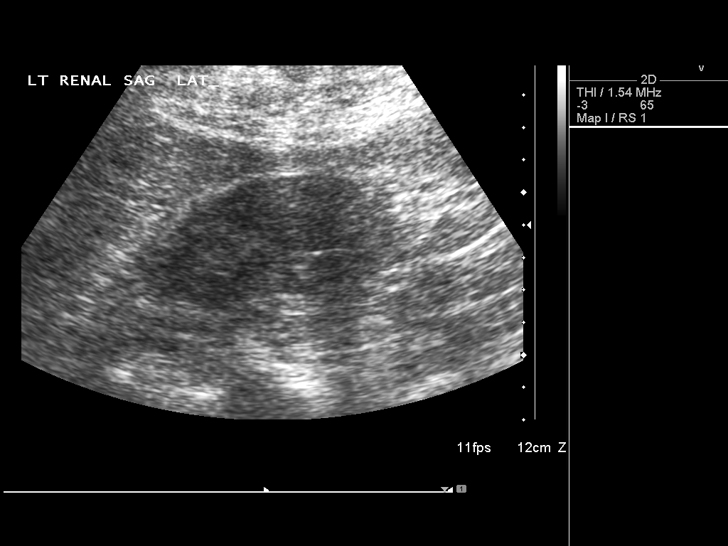
[im 60/66]
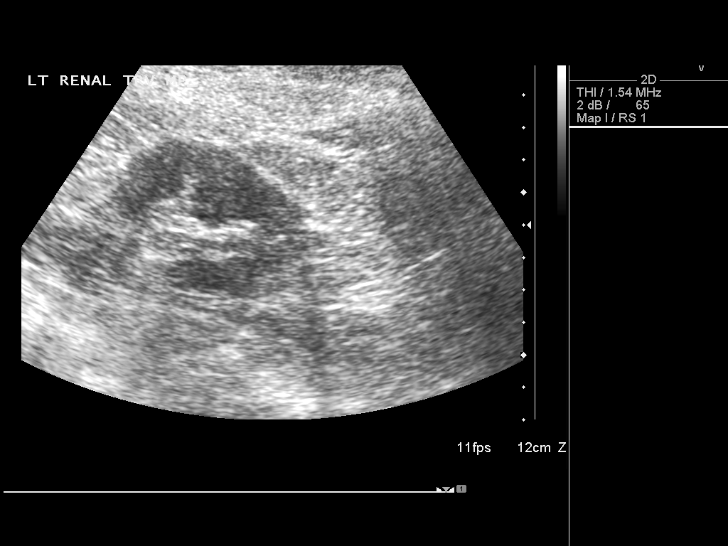
[im 66/66]
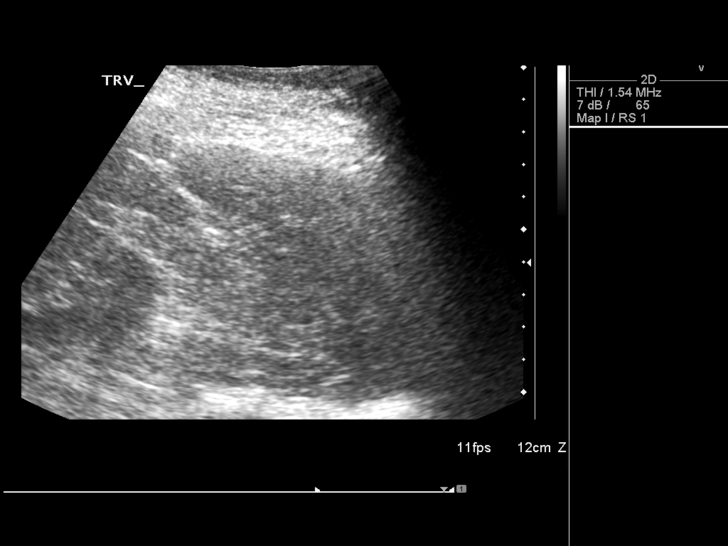

[14 of 25 positions shown; findings below may reference images not displayed]

FINDINGS: There is a single 12 x 12 x 14 mm gallstone within the
gallbladder which is mobile.  No gallbladder wall thickening is
seen and no pain is present over the gallbladder with compression.
The liver has a normal echogenic pattern.  The common bile duct is
normal measuring 3.5 mm in diameter.  The IVC and spleen appear
normal with portions of the head and tail of the pancreas obscured
by bowel gas.  No hydronephrosis is noted.  Both kidneys measure
10.4 cm sagittally.  The abdominal aorta is normal in caliber.
IMPRESSION: 1.  Single 14 mm gallstone.  No gallbladder wall thickening or
gallbladder pain is present.
 2.  Pancreas is partially obscured by bowel gas.

## 2008-10-20 ENCOUNTER — Encounter: Admission: RE | Admit: 2008-10-20 | Discharge: 2008-10-20 | Payer: Self-pay | Admitting: Nurse Practitioner

## 2008-10-20 IMAGING — CR DG CHEST 2V
2 series · 2 of 2 positions shown · non-contrast
Comparison: [HOSPITAL] chest x-ray 11/15/2004 and [facility][ONI]chest x-ray 11/22/2007.

CLINICAL DATA: Mid thoracic pain radiating to left side since
10/07/2008.

CHEST - 2 VIEW

[view not recorded (1 of 2)]
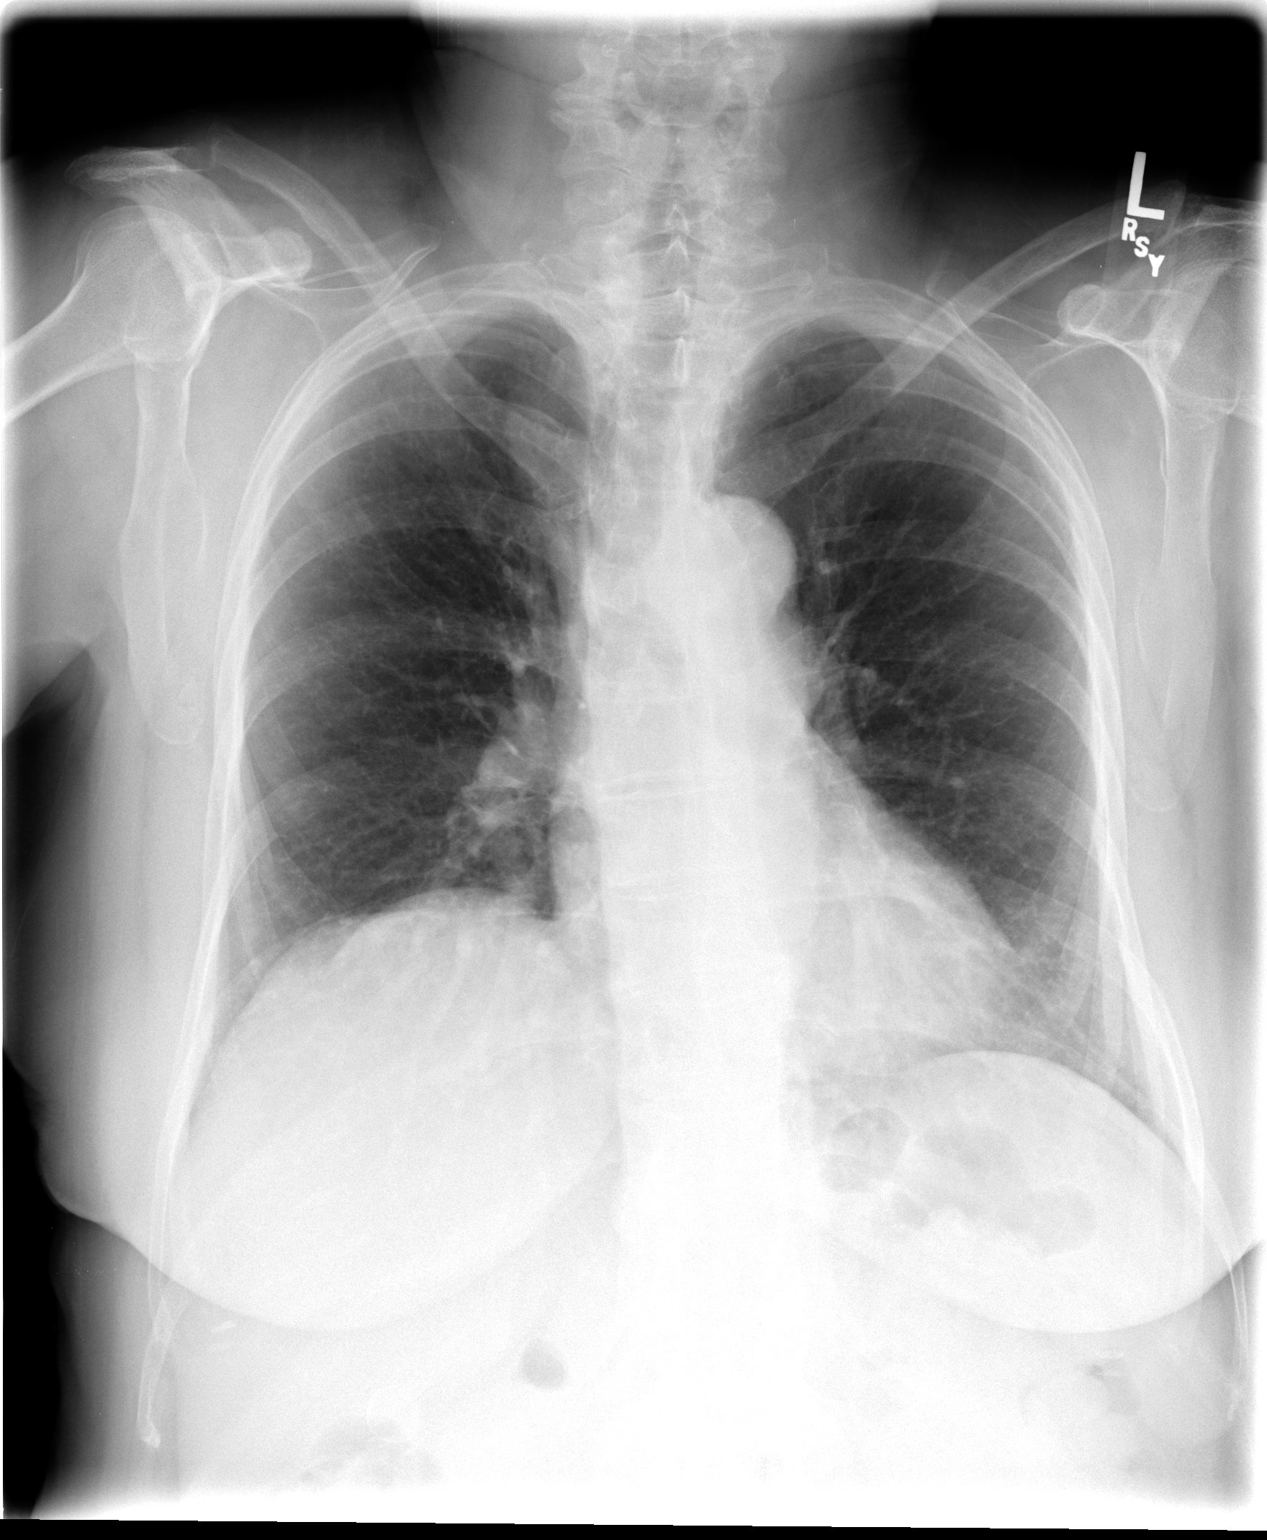

[view not recorded (2 of 2)]
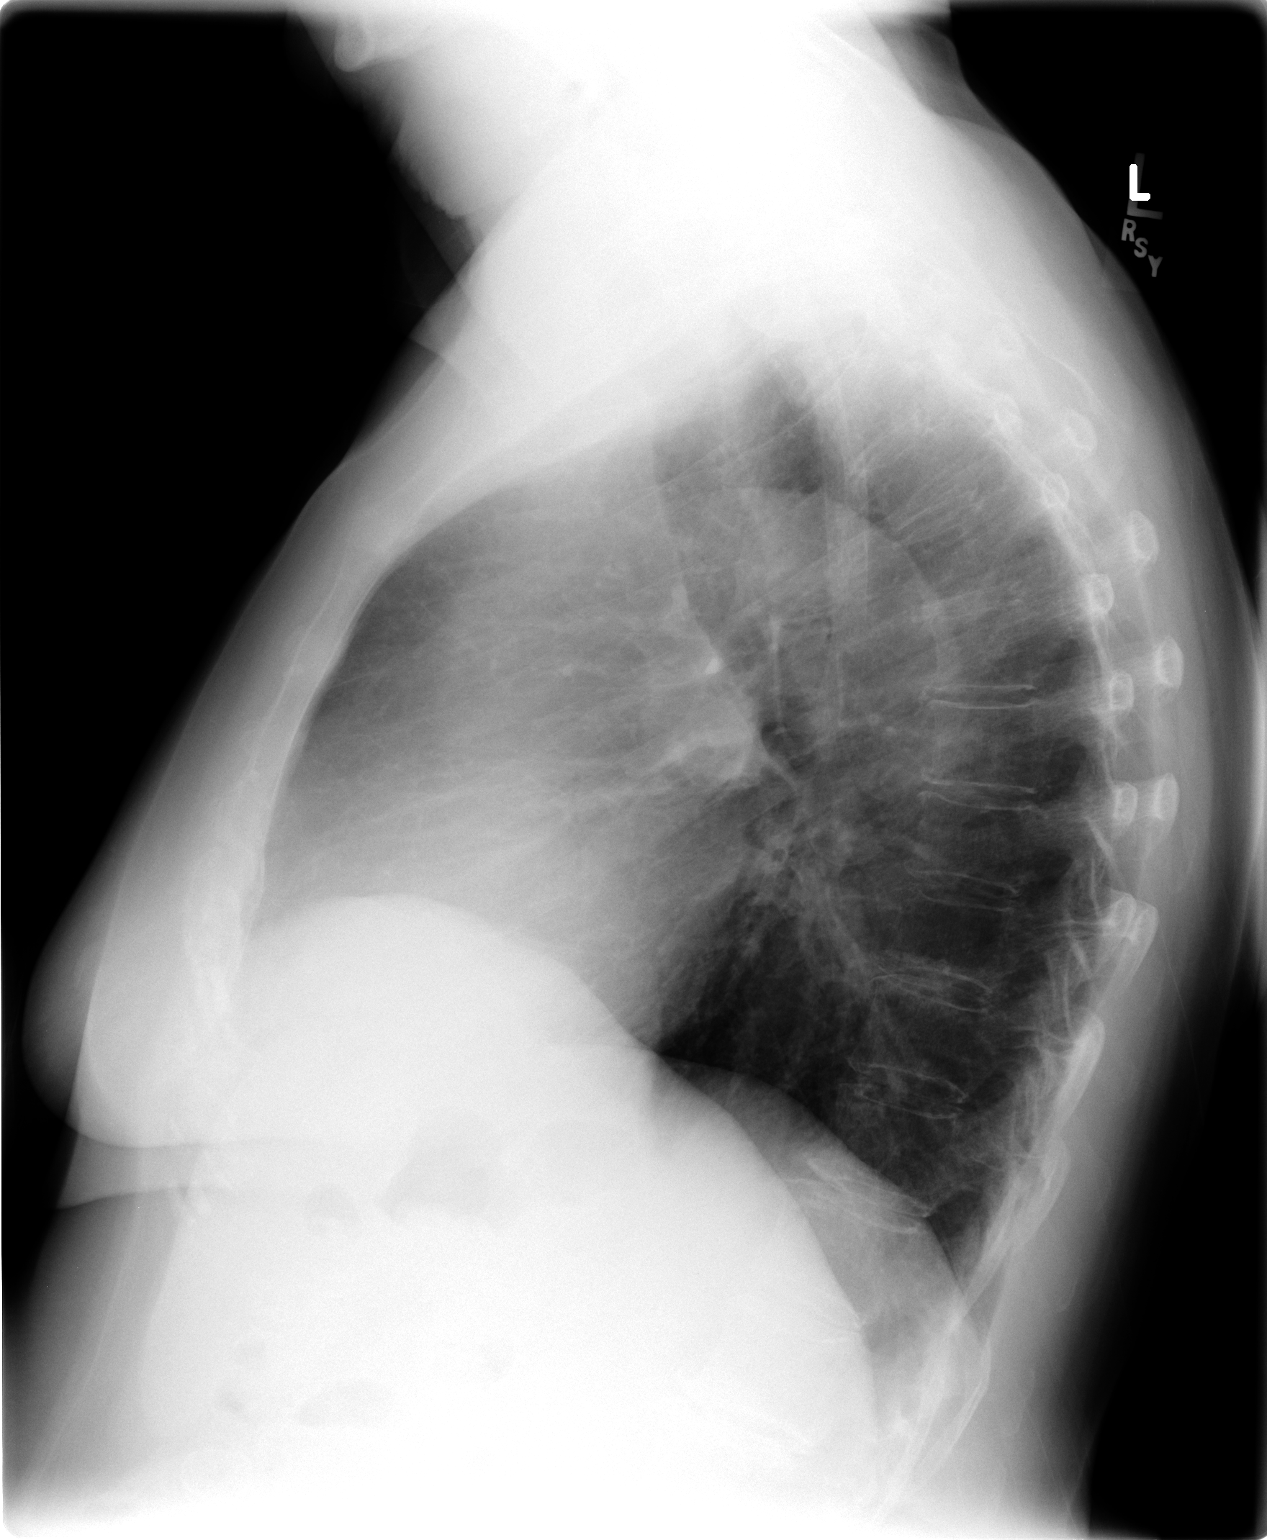

[2 of 2 positions shown; findings below may reference images not displayed]

FINDINGS: Stable dorsal osteopenia and slight levoscoliosis
thoracolumbar spine junction noted.  Lungs are clear.  Stable
slight elevation right hemidiaphragm is seen.  Heart size remains
normal.  Mediastinum, hila, pleura and osseous structures are
otherwise unremarkable.
IMPRESSION: Stable.  No active disease.

REF:G3 DICTATED: 10/20/2008 [DATE]

## 2008-10-20 IMAGING — CR DG THORACIC SPINE 3V
3 series · 3 of 3 positions shown · non-contrast
Comparison: [HOSPITAL] chest x-ray 11/15/2004 and [facility][MARSHELL]chest x-ray 11/22/2007 and [HOSPITAL] at [REDACTED] [HOSPITAL] abdominal ultrasound 09/23/2008.

CLINICAL DATA: Mid dorsal spine pain radiating to left side.

THORACIC SPINE - 2 VIEW + SWIMMERS

[view not recorded (1 of 3)]
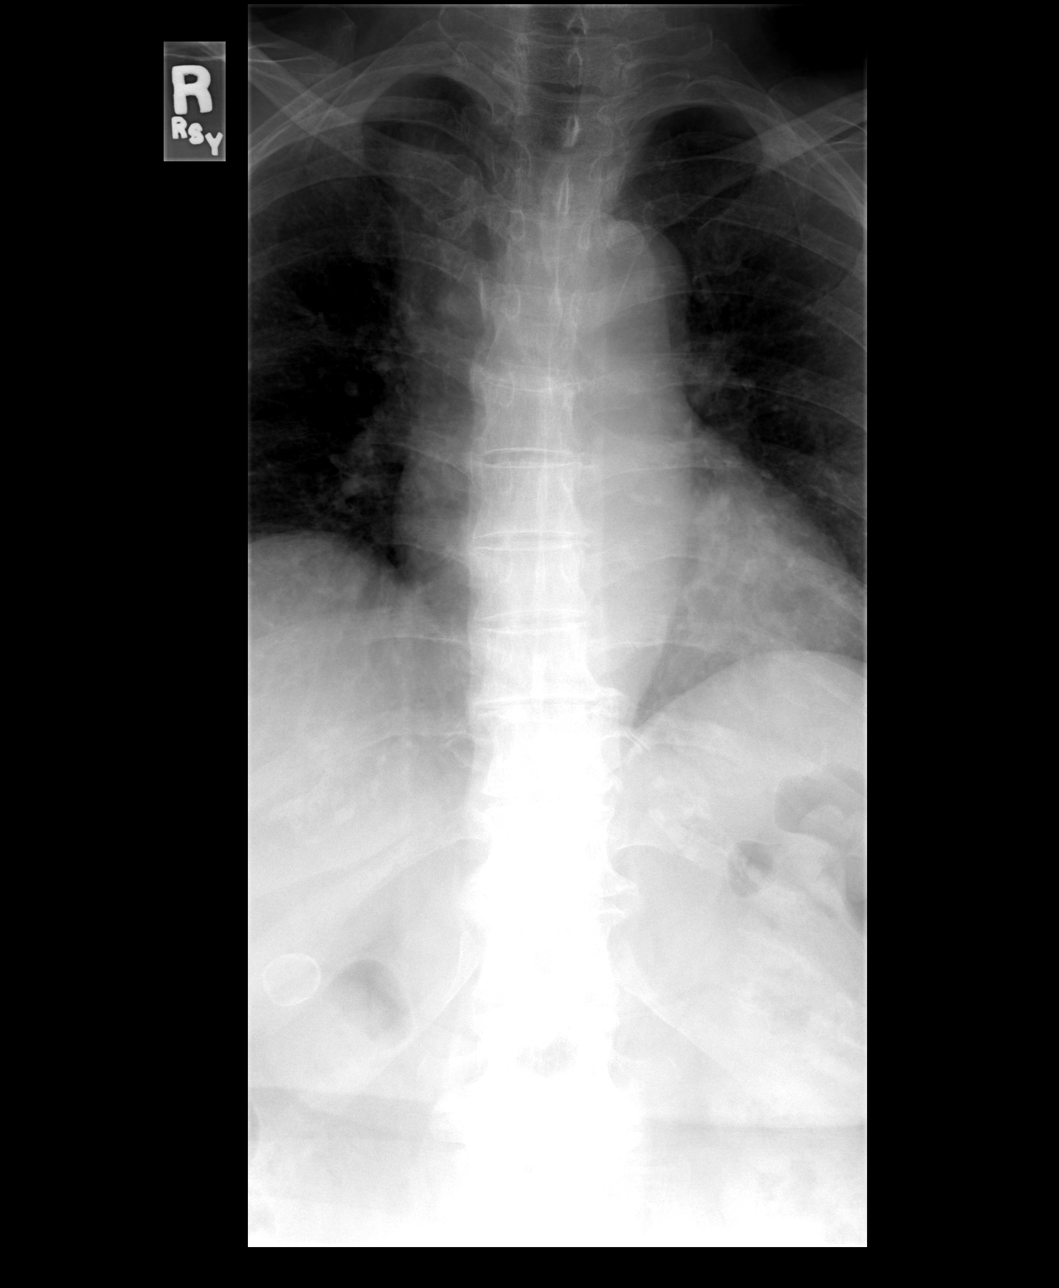

[view not recorded (2 of 3)]
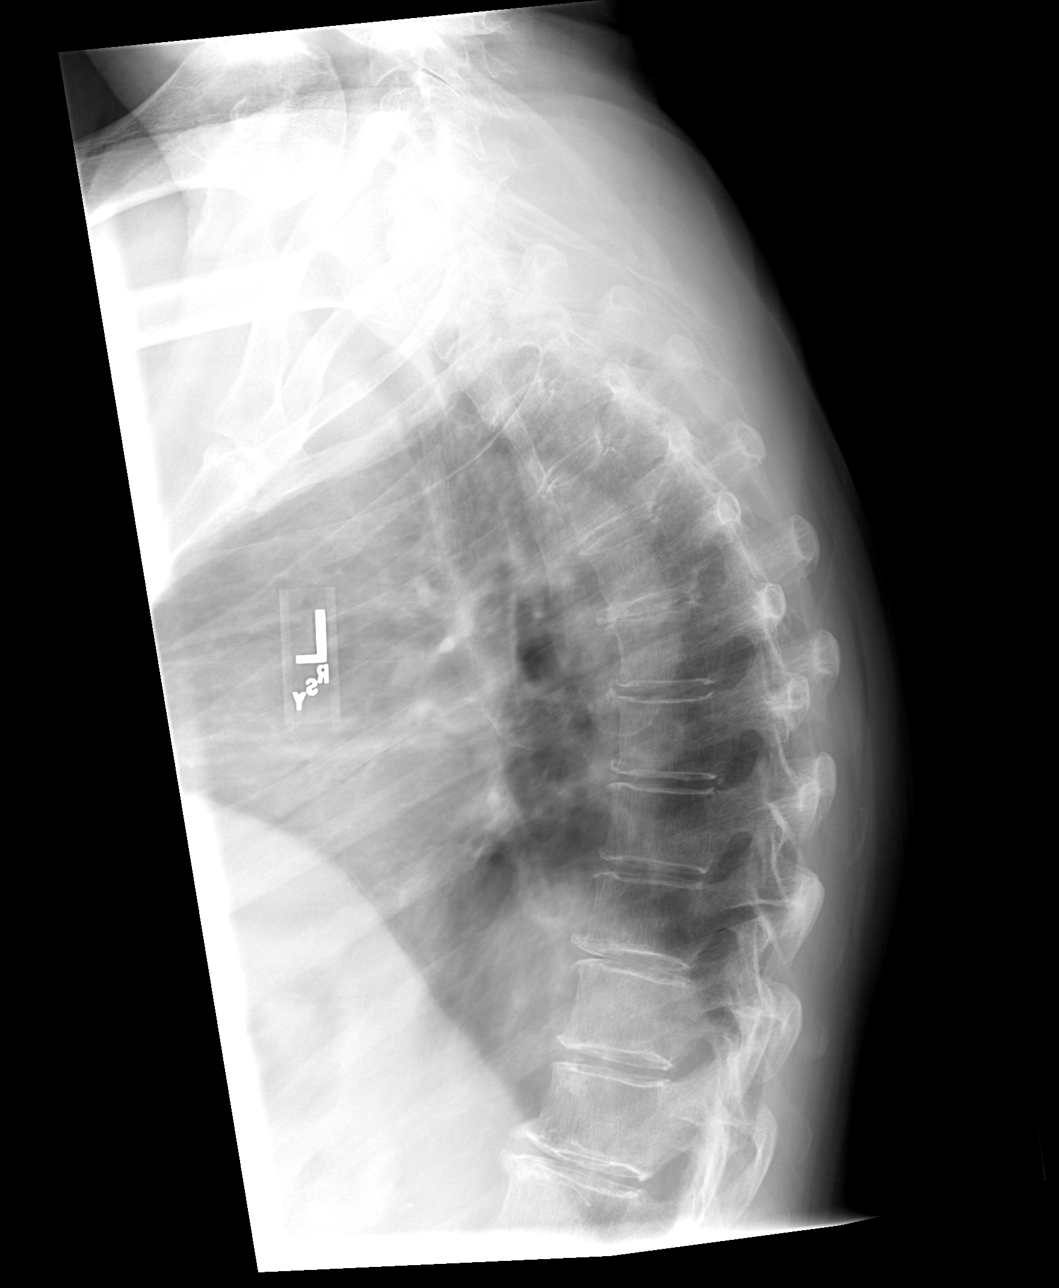

[view not recorded (3 of 3)]
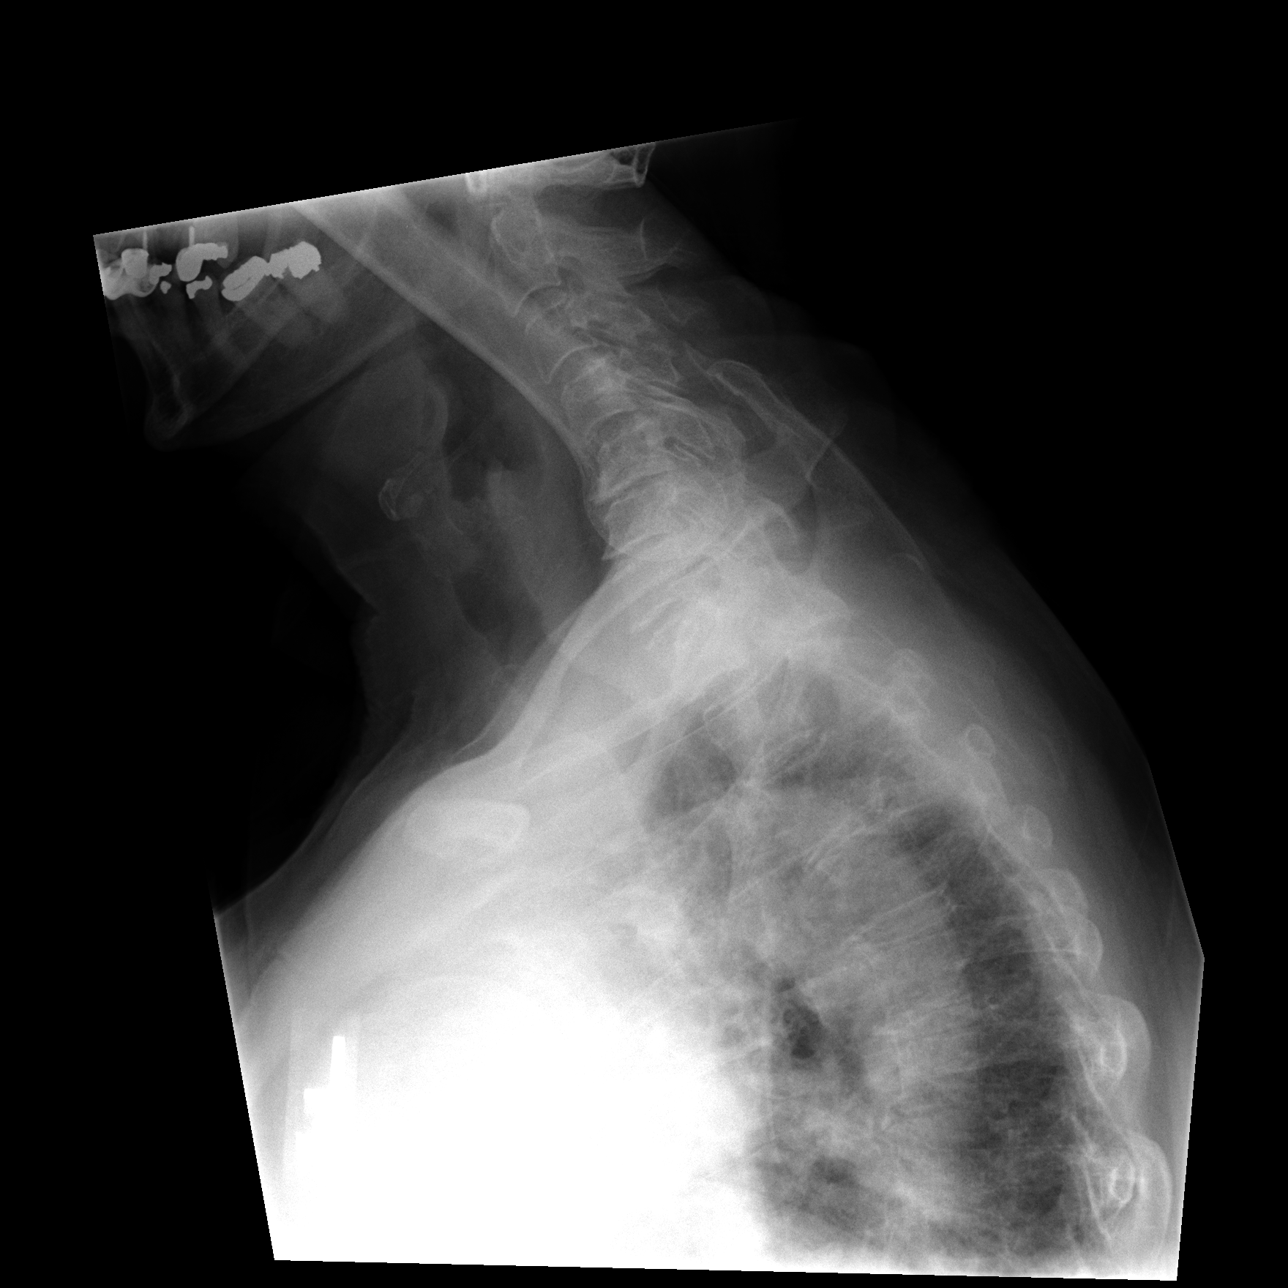

[3 of 3 positions shown; findings below may reference images not displayed]

FINDINGS: Stable slight scoliosis is seen with thoracolumbar spine
junction convex to left and mid dorsal spine convex to right.
Diffuse osteopenia and slight degenerative disc and vertebral
changes are seen.  No compression fracture, osseous lesion
otherwise visualized with moderately severe degenerative disc
disease C5-6, C6-7 and slight at C7-T1. No change in peripherally
calcified gallstone at right upper quadrant since prior abdominal
ultrasound.
IMPRESSION: 1.  Stable slight scoliosis and slight thoracolumbar degenerative
changes.  In addition, moderately severe degenerative disc disease
is seen at C5-6 and C6-7.
2.  Stable 14 mm gallstone.
3.  No acute findings.

REF:G3 DICTATED: 10/20/2008 [DATE]

## 2009-02-17 ENCOUNTER — Encounter: Admission: RE | Admit: 2009-02-17 | Discharge: 2009-02-17 | Payer: Self-pay | Admitting: Family Medicine

## 2009-08-16 ENCOUNTER — Encounter: Payer: Self-pay | Admitting: Internal Medicine

## 2009-08-27 ENCOUNTER — Telehealth: Payer: Self-pay | Admitting: Internal Medicine

## 2009-08-27 ENCOUNTER — Encounter: Admission: RE | Admit: 2009-08-27 | Discharge: 2009-08-27 | Payer: Self-pay | Admitting: Family Medicine

## 2009-08-27 IMAGING — US US RENAL
1 series · 14 of 21 positions shown · non-contrast
Comparison: None

CLINICAL DATA: Renal insufficiency.

RENAL/URINARY TRACT ULTRASOUND COMPLETE

[Series 1: us renal · 0.22mm/px · 14 of 21 slices shown]
[im 1/21]
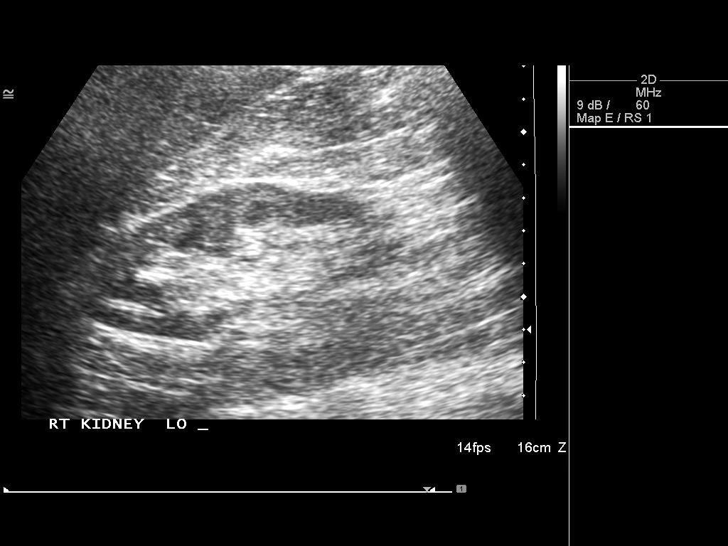
[im 3/21]
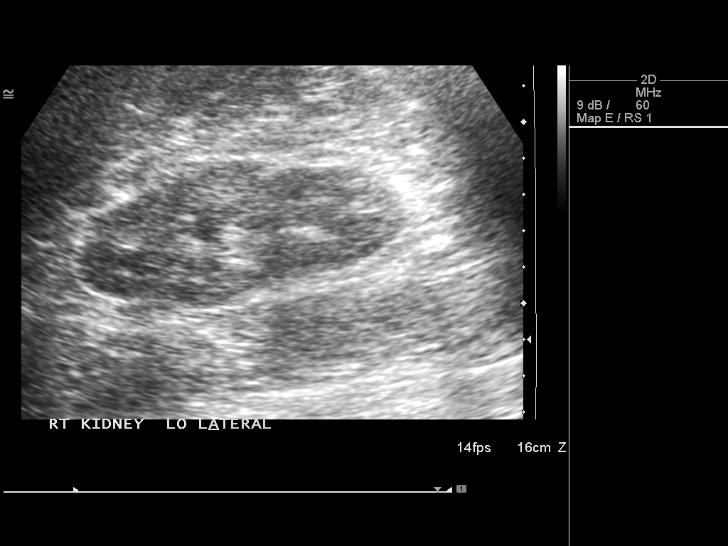
[im 4/21]
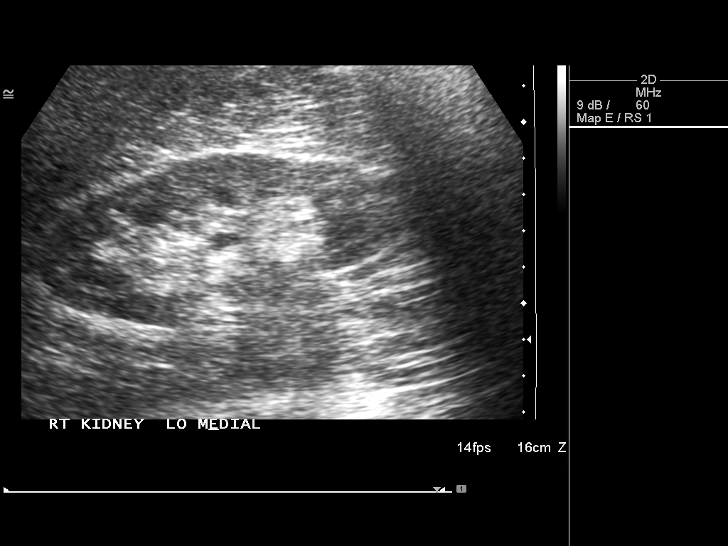
[im 6/21]
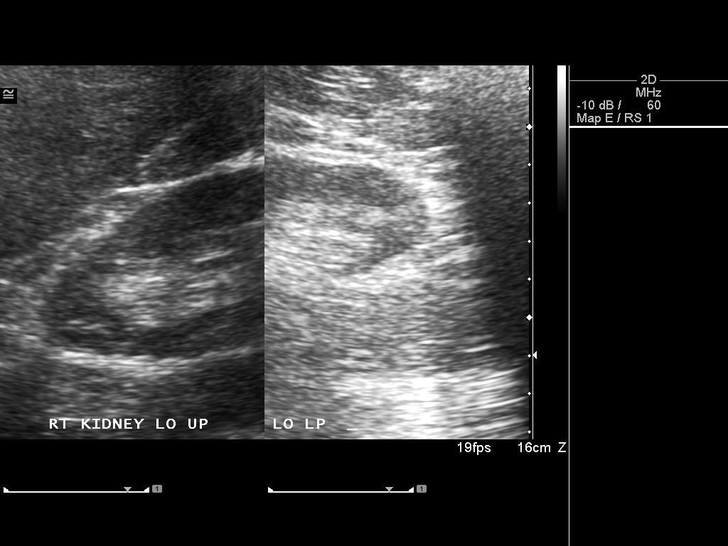
[im 7/21]
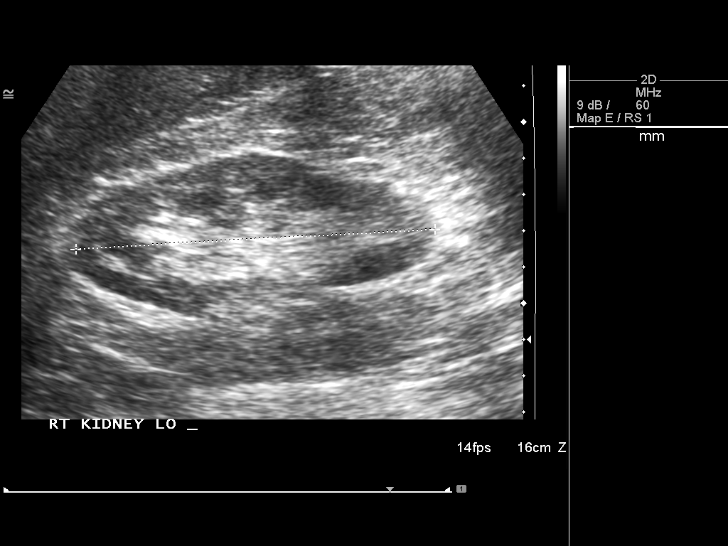
[im 9/21]
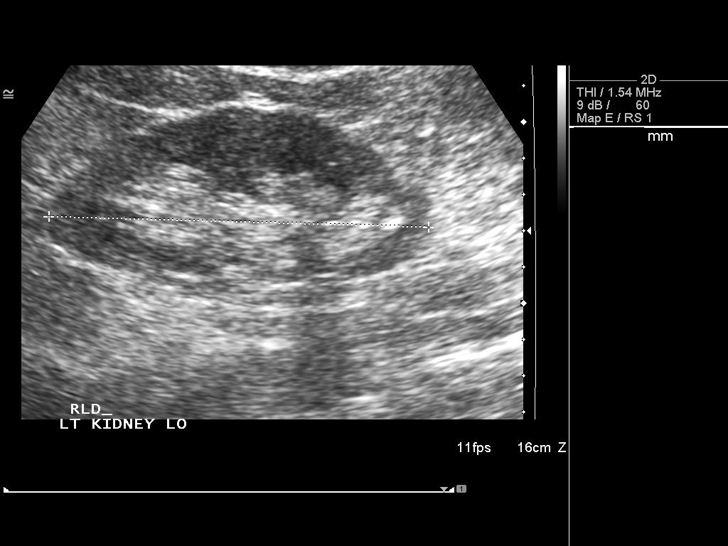
[im 10/21]
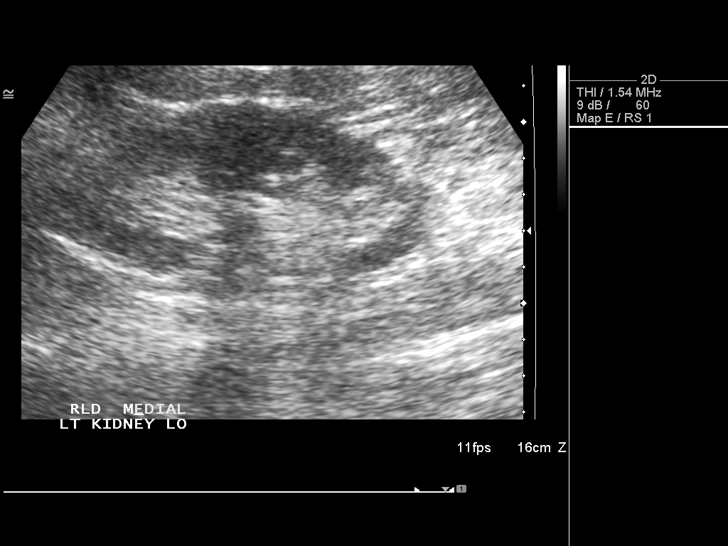
[im 12/21]
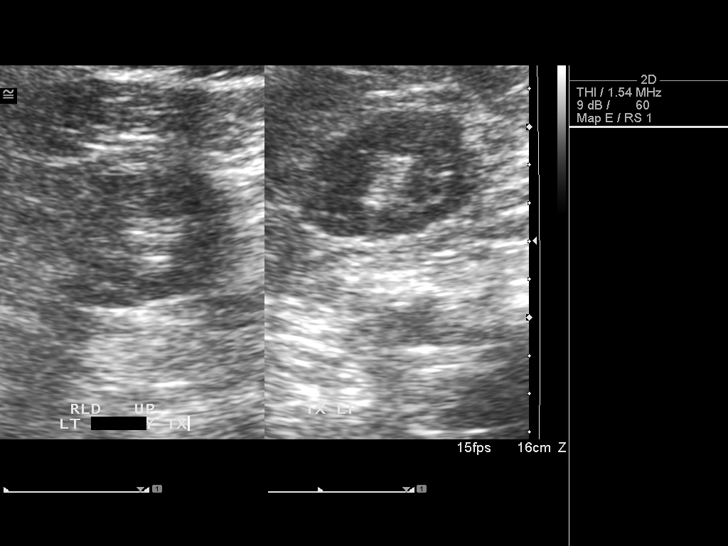
[im 13/21]
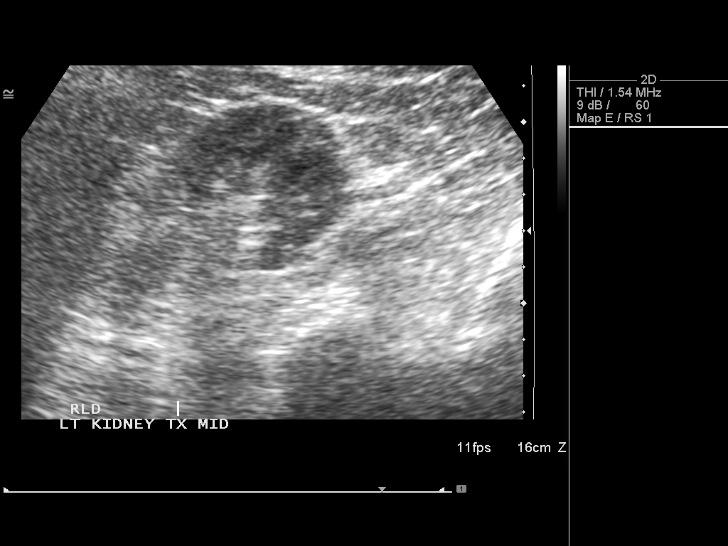
[im 15/21]
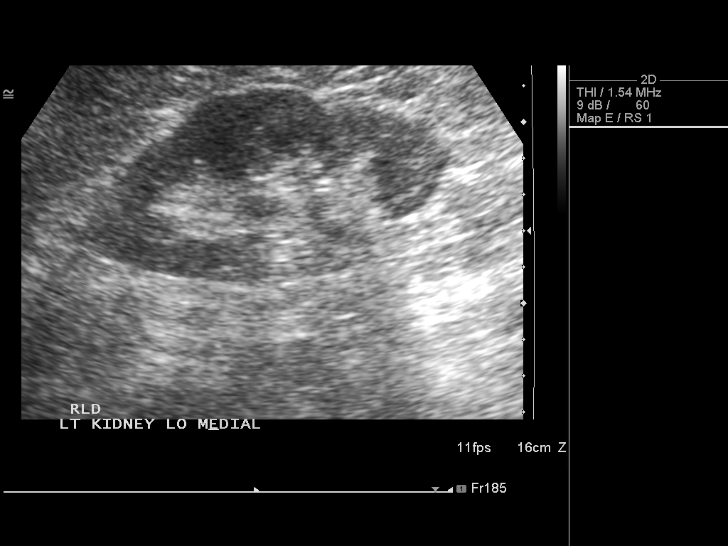
[im 16/21]
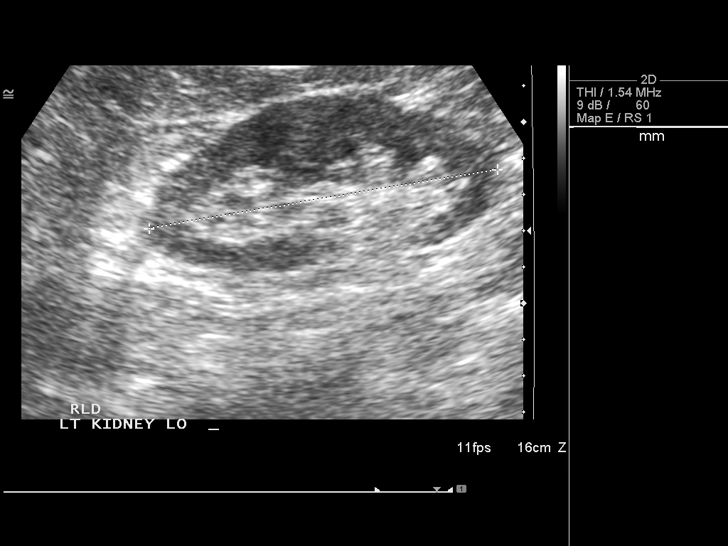
[im 18/21]
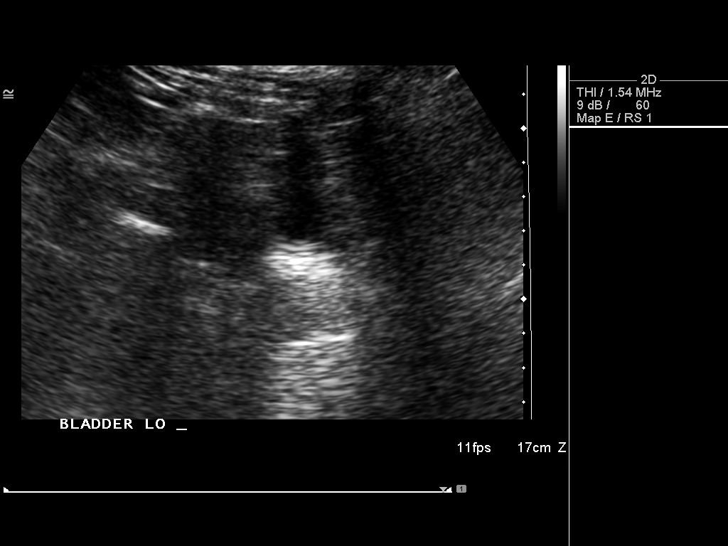
[im 19/21]
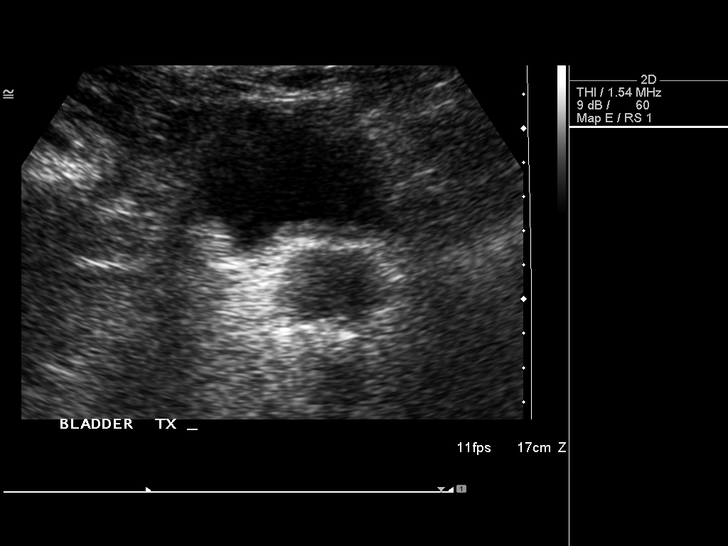
[im 21/21]
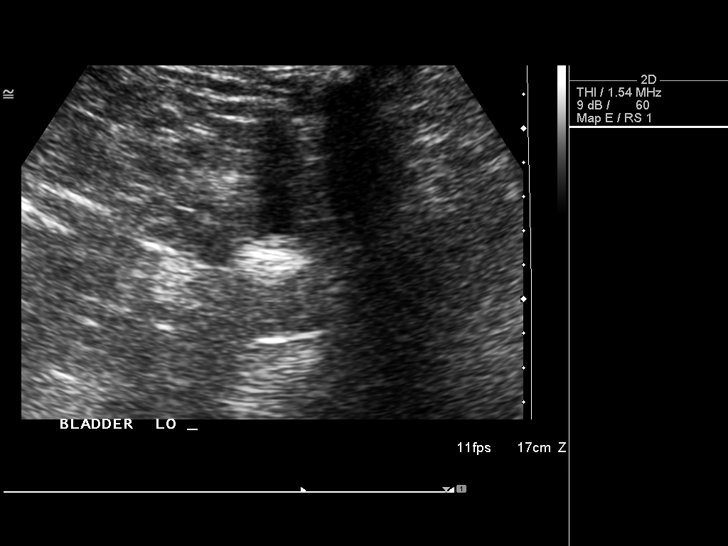

[14 of 21 positions shown; findings below may reference images not displayed]

FINDINGS: Right Kidney:  Right kidney measures 10.1 cm. Normal size and
echotexture.  No focal abnormality.  No hydronephrosis.

Left Kidney:  Left kidney measures 9.8 cm. Normal size and
echotexture.  No focal abnormality.  No hydronephrosis.

Bladder:  Incompletely distended, grossly unremarkable.
IMPRESSION: Unremarkable study.

## 2009-08-31 ENCOUNTER — Telehealth: Payer: Self-pay | Admitting: Internal Medicine

## 2009-09-02 DIAGNOSIS — N3946 Mixed incontinence: Secondary | ICD-10-CM | POA: Insufficient documentation

## 2009-09-02 DIAGNOSIS — J45909 Unspecified asthma, uncomplicated: Secondary | ICD-10-CM | POA: Insufficient documentation

## 2009-09-02 DIAGNOSIS — M899 Disorder of bone, unspecified: Secondary | ICD-10-CM | POA: Insufficient documentation

## 2009-09-02 DIAGNOSIS — I10 Essential (primary) hypertension: Secondary | ICD-10-CM | POA: Insufficient documentation

## 2009-09-02 DIAGNOSIS — E559 Vitamin D deficiency, unspecified: Secondary | ICD-10-CM | POA: Insufficient documentation

## 2009-09-02 DIAGNOSIS — J309 Allergic rhinitis, unspecified: Secondary | ICD-10-CM | POA: Insufficient documentation

## 2009-09-02 DIAGNOSIS — M949 Disorder of cartilage, unspecified: Secondary | ICD-10-CM

## 2009-09-02 DIAGNOSIS — K2100 Gastro-esophageal reflux disease with esophagitis, without bleeding: Secondary | ICD-10-CM | POA: Insufficient documentation

## 2009-09-02 DIAGNOSIS — E785 Hyperlipidemia, unspecified: Secondary | ICD-10-CM | POA: Insufficient documentation

## 2009-09-14 ENCOUNTER — Ambulatory Visit: Payer: Self-pay | Admitting: Gastroenterology

## 2009-09-14 DIAGNOSIS — D649 Anemia, unspecified: Secondary | ICD-10-CM | POA: Insufficient documentation

## 2009-09-14 DIAGNOSIS — K219 Gastro-esophageal reflux disease without esophagitis: Secondary | ICD-10-CM | POA: Insufficient documentation

## 2009-09-14 LAB — CONVERTED CEMR LAB
Basophils Absolute: 0.1 10*3/uL (ref 0.0–0.1)
Basophils Relative: 0.7 % (ref 0.0–3.0)
Eosinophils Absolute: 0.1 10*3/uL (ref 0.0–0.7)
Eosinophils Relative: 1.4 % (ref 0.0–5.0)
Ferritin: 170.4 ng/mL (ref 10.0–291.0)
HCT: 34.4 % — ABNORMAL LOW (ref 36.0–46.0)
Hemoglobin: 11.7 g/dL — ABNORMAL LOW (ref 12.0–15.0)
Iron: 79 ug/dL (ref 42–145)
Lymphocytes Relative: 35.4 % (ref 12.0–46.0)
Lymphs Abs: 3.2 10*3/uL (ref 0.7–4.0)
MCHC: 34.1 g/dL (ref 30.0–36.0)
MCV: 88.7 fL (ref 78.0–100.0)
Monocytes Absolute: 0.6 10*3/uL (ref 0.1–1.0)
Monocytes Relative: 6.4 % (ref 3.0–12.0)
Neutro Abs: 4.9 10*3/uL (ref 1.4–7.7)
Neutrophils Relative %: 56.1 % (ref 43.0–77.0)
Platelets: 303 10*3/uL (ref 150.0–400.0)
RBC: 3.88 M/uL (ref 3.87–5.11)
RDW: 12.8 % (ref 11.5–14.6)
Saturation Ratios: 26 % (ref 20.0–50.0)
Transferrin: 216.9 mg/dL (ref 212.0–360.0)
WBC: 8.9 10*3/uL (ref 4.5–10.5)

## 2009-09-21 ENCOUNTER — Inpatient Hospital Stay (HOSPITAL_COMMUNITY): Admission: RE | Admit: 2009-09-21 | Discharge: 2009-09-23 | Payer: Self-pay | Admitting: Obstetrics and Gynecology

## 2009-09-21 ENCOUNTER — Encounter (INDEPENDENT_AMBULATORY_CARE_PROVIDER_SITE_OTHER): Payer: Self-pay | Admitting: Obstetrics and Gynecology

## 2009-11-01 DIAGNOSIS — M549 Dorsalgia, unspecified: Secondary | ICD-10-CM | POA: Insufficient documentation

## 2009-11-29 ENCOUNTER — Ambulatory Visit: Payer: Self-pay | Admitting: Occupational Medicine

## 2010-07-18 DIAGNOSIS — J45909 Unspecified asthma, uncomplicated: Secondary | ICD-10-CM | POA: Insufficient documentation

## 2010-07-18 DIAGNOSIS — M8580 Other specified disorders of bone density and structure, unspecified site: Secondary | ICD-10-CM | POA: Insufficient documentation

## 2010-10-18 NOTE — Assessment & Plan Note (Signed)
Summary: BACK PAIN/NH   Vital Signs:  Patient Profile:   70 Years Old Female CC:      lower-mid back pain radiating to left shoulder blade X 3 days Height:     66 inches Weight:      171 pounds O2 Sat:      100 % O2 treatment:    Room Air Temp:     97.6 degrees F oral Pulse rate:   73 / minute Resp:     16 per minute BP sitting:   136 / 84  (right arm)  Pt. in pain?   yes    Location:   lower back    Intensity:   5    Type:       dull  Vitals Entered By: Lajean Saver RN (November 29, 2009 12:57 PM)                   Updated Prior Medication List: HYDROCHLOROTHIAZIDE 12.5 MG CAPS (HYDROCHLOROTHIAZIDE) take one tablet by mouth once daily LOSARTAN POTASSIUM 100 MG TABS (LOSARTAN POTASSIUM) take one tablet by mouth once daily OXYBUTYNIN CHLORIDE 10 MG XR24H-TAB (OXYBUTYNIN CHLORIDE) take one tablet by mouth once daily PRILOSEC 20 MG CPDR (OMEPRAZOLE) take one tablet by mouth once daily SIMVASTATIN 80 MG TABS (SIMVASTATIN) take one tablet by mouth once daily SINGULAIR 10 MG TABS (MONTELUKAST SODIUM) take one tablet by mouth once daily VITAMIN D3 1000 UNIT TABS (CHOLECALCIFEROL) take one tablet by mouth once daily ADVAIR DISKUS 100-50 MCG/DOSE AEPB (FLUTICASONE-SALMETEROL) one inhalation two times a day  Current Allergies (reviewed today): No known allergies History of Present Illness Chief Complaint: lower-mid back pain radiating to left shoulder blade X 3 days History of Present Illness: Presents with complaints of mid back pain and pain under her scapula on the left.   No associated nausea.  No aggrivating or aleving activities.    Denies any nauea or abdominal pain.   Denies any low back pain,   No neck pain either.   No unusual activities, fall, or trauma.   She has been recovering from a TAH and bladder surgery  that was done January 4.       REVIEW OF SYSTEMS Constitutional Symptoms      Denies fever, chills, night sweats, weight loss, weight gain, and fatigue.    Eyes       Denies change in vision, eye pain, eye discharge, glasses, contact lenses, and eye surgery. Ear/Nose/Throat/Mouth       Denies hearing loss/aids, change in hearing, ear pain, ear discharge, dizziness, frequent runny nose, frequent nose bleeds, sinus problems, sore throat, hoarseness, and tooth pain or bleeding.  Respiratory       Denies dry cough, productive cough, wheezing, shortness of breath, asthma, bronchitis, and emphysema/COPD.  Cardiovascular       Denies murmurs, chest pain, and tires easily with exhertion.    Gastrointestinal       Denies stomach pain, nausea/vomiting, diarrhea, constipation, blood in bowel movements, and indigestion. Genitourniary       Denies painful urination, kidney stones, and loss of urinary control. Neurological       Denies paralysis, seizures, and fainting/blackouts. Musculoskeletal       Complains of muscle pain and joint stiffness.      Denies joint pain, decreased range of motion, redness, swelling, muscle weakness, and gout.      Comments: lower back and left shoulder blade Skin       Denies bruising, unusual mles/lumps or  sores, and hair/skin or nail changes.  Psych       Denies mood changes, temper/anger issues, anxiety/stress, speech problems, depression, and sleep problems. Other Comments: patient had a bladder tact and total hysterectomy in 09/2009. Patient has informed GYN MD of symptoms.   Past History:  Past Medical History: Reviewed history from 09/14/2009 and no changes required. 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 Occupation: Retired Patient has never smoked.  Daily Caffeine Use Married Alcohol Use - no Illicit Drug Use - no Physical Exam General appearance: well developed, well nourished, no acute distress Chest/Lungs: no rales, wheezes, or rhonchi bilateral, breath sounds equal without effort Heart: regular rate and  rhythm, no murmur Back: Tender in the left thoracic region and left infrascapuar region.   Scoliosis noted.  No spinal tenderness.   straight leg raises negative bilaterally, deep tendon reflexes 2+ at achilles and patella Assessment New Problems: LOWER BACK PAIN (ICD-724.2)   Plan New Medications/Changes: METHOCARBAMOL 500 MG TABS (METHOCARBAMOL) one tablet every 8 hours for muscle spasms  #30 x 2, 11/29/2009, Kathrine Haddock MD DICLOFENAC SODIUM 75 MG TBEC (DICLOFENAC SODIUM) one tablet twice a day for pain.  #30 x 2, 11/29/2009, Kathrine Haddock MD  New Orders: Est. Patient Level II [phone] Planning Comments:   Diclofenac and methocarbamol as prescribed follow up with PCP and if no better in 1 week may need imaging and PT.   The patient and/or caregiver has been counseled thoroughly with regard to medications prescribed including dosage, schedule, interactions, rationale for use, and possible side effects and they verbalize understanding.  Diagnoses and expected course of recovery discussed and will return if not improved as expected or if the condition worsens. Patient and/or caregiver verbalized understanding.  Prescriptions: METHOCARBAMOL 500 MG TABS (METHOCARBAMOL) one tablet every 8 hours for muscle spasms  #30 x 2   Entered and Authorized by:   Kathrine Haddock MD   Signed by:   Kathrine Haddock MD on 11/29/2009   Method used:   Print then Give to Patient   RxID:   [id number] DICLOFENAC SODIUM 75 MG TBEC (DICLOFENAC SODIUM)  one tablet twice a day for pain.  #30 x 2   Entered and Authorized by:   Kathrine Haddock MD   Signed by:   Kathrine Haddock MD on 11/29/2009   Method used:   Print then Give to Patient   RxID:   [id number]

## 2010-10-18 NOTE — Progress Notes (Signed)
Summary: Reschedule appt.  Phone Note From Other Clinic   Caller: Wilford Sports [phone] Call For: Dr Juanda Chance Reason for Call: Schedule Patient Appt Summary of Call: Wants to re schedule pt's appointment asap. Initial call taken by: Leanor [facility],  August 31, 2009 2:24 PM  Follow-up for Phone Call        Pt. was a 'No Show' for appt. today, [name] states they gave pt. an incorrect appt. date. Pt. is rescheduled for 09-03-09 at 10am. [name] will advise pt. of appt/med.list/co-pay/cx.policy and she will fax notes. Follow-up by: Laureen Ochs LPN,  August 31, 2009 3:28 PM

## 2010-10-18 NOTE — Procedures (Signed)
Summary: ENDOSCOPY    Patient Name: [name], [name] MRN:  Procedure Procedures: Panendoscopy (EGD) CPT: 43235.    with biopsy(s)/brushing(s). CPT: D1846139.  Personnel: Endoscopist: Dora L. Juanda Chance, MD.  Exam Location: Exam performed in Outpatient Clinic. Outpatient  Patient Consent: Procedure, Alternatives, Risks and Benefits discussed, consent obtained, from patient. Consent was obtained by the RN.  Indications Symptoms: Pulmonary symptoms, including:  Cough. Reflux symptoms  History  Current Medications: Patient is not currently taking Coumadin.  Pre-Exam Physical: Performed Dec 06, 2004  Entire physical exam was normal.  Exam Exam Info: Maximum depth of insertion Duodenum, intended Duodenum. Vocal cords visualized. Gastric retroflexion performed. Images taken. ASA Classification: I. Tolerance: good.  Sedation Meds: Patient assessed and found to be appropriate for moderate (conscious) sedation. Fentanyl 50 mcg. given IV. Versed 8 mg. given IV. Cetacaine Spray 2 sprays given aerosolized.  Monitoring: BP and pulse monitoring done. Oximetry used. Supplemental O2 given  Findings - Normal: Distal Esophagus to Cardia. Biopsy/Normal taken. Comments: normal appearing g-e junction without hiatal hernia.   Assessment Normal examination.  Comments: s/p biopsies at the g-e junction Events  Unplanned Intervention: No unplanned interventions were required.  Unplanned Events: There were no complications. Plans Medication(s): Await pathology. PPI: Aciphex 20 mg QD, starting Dec 06, 2004   Disposition: After procedure patient sent to recovery. After recovery patient sent home.       CC: Ranjan Sharma,MD     Ollen Gross   This report was created from the original endoscopy report, which was reviewed and signed by the above listed endoscopist.

## 2010-10-18 NOTE — Op Note (Signed)
Summary: COLON  NAME:  [name], [name]                         ACCOUNT NO.:  [id number]   MEDICAL RECORD NO.:  [id number]                   PATIENT TYPE:  AMB   LOCATION:  ENDO                                 FACILITY:  [facility]   PHYSICIAN:  Graylin Shiver, M.D.                DATE OF BIRTH:  1941-04-06   DATE OF PROCEDURE:  01/06/2004  DATE OF DISCHARGE:                                 OPERATIVE REPORT   PROCEDURE:  Colonoscopy.   INDICATIONS:  Screening. Informed consent was obtained after explanation of  risks such as bleeding, infection and perforation.   PREMEDICATION:  Fentanyl 70 mcg IV, Versed 8mg  IV.   DESCRIPTION OF PROCEDURE:  With the patient the left lateral decubitus  position, rectal exam was performed, no masses were felt. The Olympus  colonoscope was inserted into the rectum, advanced around the colon to the  cecum.  Cecal landmarks were identified. The cecum and ascending colon were  normal. The transverse colon was norma. The descending colon and sigmoid  revealed diverticulosis.  The rectum was normal. She tolerated the procedure  well without complications.   IMPRESSION:  Diverticulosis of the left colon.                                               Graylin Shiver, M.D.    Germain Osgood  D:  01/06/2004  T:  01/06/2004  Job:  [id number]   cc:   Stacie Acres. White, M.D.  [address]  [place]  [place] [address]  Fax: [phone]

## 2010-10-18 NOTE — Assessment & Plan Note (Signed)
Summary: Gastroenterology  MEHREEN AZIZI MR#:  [id number] Page #  NAME:  KORTNE, ALL  OFFICE NO:  [id number]  DATE:  11/28/2004  DOB:  05/29/1941  HISTORY OF PRESENT ILLNESS:  The patient is a very nice 70 year old white female who comes for gastrointestinal evaluation to assess degree of gastroesophageal reflux and its relationship to her chronic cough.  The patient was seen by Dr. Sherene Sires in 2003 at Dr. Lucilla Lame recommendation and was treated for chronic cough with antitussive medications.  She was also given a trial of proton pump inhibitors, which did not seem to help at all.  She was evaluated apparently by an ENT specialist, but I do not have this consultation.  The entire pulmonary workup, according to Dr. Sherene Sires, was not necessary.  She describes dry cough, which occurs during the day as well as at night.  She saw Dr. Hollister Callas who evaluated her for allergies.  There is a history of reflux for which she has taken initially Tums and AcipHex 20 mg a day.  The dose was then increased to 20 mg twice a day with no significant improvement.  She has been on b.i.d. proton pump inhibitors for at least 4 weeks.  The proton pump inhibitors seem to treat her heartburn, but do not have any impact on her cough.  Despite taking AcipHex, she still has occasional regurgitation of food at night or at least some mucus at night.  She is not quite sure.  Something seems to come up her throat at night.  She remembers having some reflux during her 4 pregnancies many years ago.  She had a lower gastrointestinal evaluation by Dr. Evette Cristal in 2004.  She was referred by Dr. Cliffton Asters.  We do not have those records.  CURRENT MEDICATIONS:  Include verapamil 240 mg p.o. q.d., Singulair 10 mg q.d., VESIcare 5 mg q.d., AcipHex 20 mg p.o. b.i.d., Avapro 150 mg p.o. q.d., Nasonex 220 mg q.d.  PAST ILLNESSES:  High blood pressure, asthma.  FAMILY HISTORY:  Heart disease in father and mother.  Ovarian cancer in sister.  SOCIAL HISTORY:  She  is married, has 4 children, 12th-grade education.  She worked at [employer].  She never smoked and does not drink alcohol.  REVIEW OF SYSTEMS:  Positive for eyeglasses, frequent cough, occasional hoarseness, no dysphagia or odynophagia, occasional shortness of breath and leakage of urine.  PHYSICAL EXAMINATION:  Blood pressure 110/64, pulse 78, and weight 183 pounds.  She was very cooperative.  Her voice was normal.  There was no hoarseness or raspiness.  The patient was coughing frequently with a dry, hacking cough.  Lungs were clear.  I could not appreciate any wheezes, rales, or rhonchi.  Cor with normal S1, normal S2.  Abdomen was protuberant with decreased muscle tone, but soft with normoactive bowel sound.  Normal lower abdomen.  Liver edge at costal margin.  Breast exam shows normal rectal tone with Hemoccult-negative stool.  Extremities:  No edema.  IMPRESSION: 60.   A 70 year old white female with a chronic cough, status post pulmonary and allergy evaluation by Dr. Sherene Sires and Dr. Blackduck Callas respectively with no response to proton pump inhibitors. 2.   History of gastroesophageal reflux disease, which is well controlled on proton pump inhibitors.  There is no evidence that her reflux is causing her cough or intermittent hoarseness. 3.   Status post recent normal colonoscopic evaluation by Dr. Evette Cristal.  PLAN: 1.   To complete the gastrointestinal evaluation, I have scheduled  the patient for upper endoscopy and possible biopsies to rule out Barrett esophagus or to document chronic esophagitis, also to rule out any anatomic abnormality of the esophagus such as a hiatal hernia. 2.   Depending on the upper endoscopy result, patient will likely need a 24-hour intraesophageal pH probe to assess the reflux episodes.  We will do it either on on proton pump inhibitor a day or 2 proton pump inhibitors a day depending on the results of the esophageal biopsies. 3.   The patient was switched from  AcipHex to Protonix 40 mg p.o. q.d., because her insurance carrier prefers this medication.      Hedwig Morton. Juanda Chance, M.D.  DMB/far/ljw cc:  Sidney Ace, M.D.   Laurann Montana, M.D. D:  11/28/04; T:  11/29/04; Job [id number]

## 2010-10-18 NOTE — Op Note (Signed)
Summary: 24 Hour PH Probe  NAME:  [name], [name]               ACCOUNT NO.:  [id number]   MEDICAL RECORD NO.:  [id number]          PATIENT TYPE:  AMB   LOCATION:                               FACILITY:  [facility]   PHYSICIAN:  Lina Sar, M.D. [facility]  DATE OF BIRTH:  Mar 13, 1941   DATE OF PROCEDURE:  01/17/2005  DATE OF DISCHARGE:                                 OPERATIVE REPORT   INDICATIONS:  This 70 year old female has had a chronic cough which has not  improved on high dose proton pump inhibitor. Upper endoscopy on December 06, 2004 showed normal exam. Biopsies from the GE junction shows mild chronic  esophagitis consistent with reflux. The patient has had allergist evaluation  as well as pulmonary evaluation by Dr. Nyoka Cowden. A 24-hour pH  Persantine is done while taking AcipHex 20 mg a day.   DESCRIPTION OF PROCEDURE:  The dual channel esophageal probe was placed  through the nares into the esophagus and was placed at 5 and 10 cm proximal  to the lower esophageal sphincter. In the proximal probe, there were a total  of 9 reflux episodes during a 24-hour period of time, all occurred in  upright position. There was one episode lasting less than four minutes. The  total percent of the time pH was less than 4 with 0.1%. The longest episode  lasted two minutes.   In the distal probe, the patient had a total of 16 reflux episodes all  occurred in the upright position and the longest episode lasted one minute.  Persantine pH was less than 0.4 with 0.6%. The composite score showed a  total of 17 reflux episodes with total score of 0.7, normal being less than  50.   IMPRESSION:  This is a normal intraesophageal pH probe while the patient  takes AcipHex 20 mg a day.      DB/MEDQ  D:  02/20/2005  T:  02/20/2005  Job:  [id number]   cc:   Stacie Acres. White, M.D.  [address]  [place]  [place] [address]  Fax: [phone]   Sidney Ace, M.D. [facility]  [address]  [place]  [place] [address]  Fax: [phone]

## 2010-10-18 NOTE — Assessment & Plan Note (Signed)
Summary: ANEMIA                 (DR.BRODIE PT.)           DEBORAH   History of Present Illness Visit Type: Initial Consult Primary GI MD: Lina Sar MD Primary Provider: Maryelizabeth Rowan, MD Requesting Provider: Maryelizabeth Rowan, MD Chief Complaint: Pateint referred for anemia, but states that she has no complaints she feels fine. Patient having bladder tact and complete hysterectomy on 09/21/2009. She needs to be cleared for surgery. History of Present Illness:   70 YO. FEMALE KNOWN REMOTELY TO DR BRODIE. SHE HAS HAD EVALUATION  FOR GERD.LAST EGD 2006. SHE HAD A COLONOSCOPY WITH DR Evette Cristal IN 4/05 WHICH REVEALED DIVERTICULAR DISEASE. SHE IS REFERRED HERE TODAY FOR WORK-UP OF ANEMIA. SHE IS SCHEDULED TO HAVE HYSTERECTOMY/BSO AND BLADDER TACK NEXT WEEK.SHE HAS HAD NO VAGINAL BLEEDING. MOST RECENT HGB  ON 08/16/09 WAS 11.4.MCV87. SHE HAS NO GI COMPLAINTS. SHE IS MAINTAINED ON PRILOSEC-NO HEARTBURN, INDIGESTION,DYSPHAGIA. NO ABDOMINAL PAIN, CHANGE IN BOWEL HABITS,NO MELENA OR HEMATOCHEZIA.   GI Review of Systems      Denies abdominal pain, acid reflux, belching, bloating, chest pain, dysphagia with liquids, dysphagia with solids, heartburn, loss of appetite, nausea, vomiting, vomiting blood, and  weight loss.        Denies anal fissure, black tarry stools, change in bowel habit, constipation, diarrhea, diverticulosis, fecal incontinence, heme positive stool, hemorrhoids, irritable bowel syndrome, jaundice, light color stool, liver problems, rectal bleeding, and  rectal pain.    Current Medications (verified): 1)  Hydrochlorothiazide 12.5 Mg Caps (Hydrochlorothiazide) .... Take One Tablet By Mouth Once Daily 2)  Tussionex Pennkinetic Er 8-10 Mg/59ml Lqcr (Chlorpheniramine-Hydrocodone) .... 1/2 To 1 Teaspoon Every 12 Hours As Needed For Cough 3)  Losartan Potassium 100 Mg Tabs (Losartan Potassium) .... Take One Tablet By Mouth Once Daily 4)  Oxybutynin Chloride 10 Mg Xr24h-Tab (Oxybutynin Chloride)  .... Take One Tablet By Mouth Once Daily 5)  Prilosec 20 Mg Cpdr (Omeprazole) .... Take One Tablet By Mouth Once Daily 6)  Simvastatin 80 Mg Tabs (Simvastatin) .... Take One Tablet By Mouth Once Daily 7)  Singulair 10 Mg Tabs (Montelukast Sodium) .... Take One Tablet By Mouth Once Daily 8)  Vitamin D3 1000 Unit Tabs (Cholecalciferol) .... Take One Tablet By Mouth Once Daily 9)  Advair Diskus 100-50 Mcg/dose Aepb (Fluticasone-Salmeterol) .... 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  Daily Caffeine Use Married Alcohol Use - no Illicit Drug Use - no  Review of Systems       The patient complains of urine leakage.  The patient denies allergy/sinus, anemia, anxiety-new, arthritis/joint pain, back pain, blood in urine, breast changes/lumps, change in vision, confusion, cough, coughing up blood, depression-new, fainting, fatigue, fever, headaches-new, hearing problems, heart murmur, heart rhythm changes, itching, menstrual pain, muscle pains/cramps, night sweats, nosebleeds, pregnancy symptoms, shortness of breath, skin rash, sleeping problems, sore throat, swelling of feet/legs, swollen lymph glands, thirst - excessive , urination - excessive , urination changes/pain, vision changes, and voice change.    Vital Signs:  Patient profile:   70 year old female Height:      66 inches Weight:      164 pounds BMI:     26.57 Pulse rate:   72 /  minute Pulse rhythm:   regular BP sitting:   102 / 62  (right arm) Cuff size:   regular  Vitals Entered By: Harlow Mares CMA Duncan Dull) (September 14, 2009 9:51 AM)  Physical Exam  General:  Well developed, well nourished, no acute distress. Eyes:  PERRLA, no icterus. Ears:  Normal auditory acuity. Neck:  Supple; no masses or thyromegaly. Lungs:  Clear throughout to auscultation. Heart:  Regular rate and rhythm; no murmurs, rubs,  or bruits. Abdomen:  SOFT,NONTENDER, NO MASS OR HSM,BS+ Rectal:  HEME NEGATIVE STOOL Extremities:  No clubbing, cyanosis, edema or deformities noted. Neurologic:  Alert and  oriented x4;  grossly normal neurologically. Psych:  Alert and cooperative. Normal mood and affect.   Impression & Recommendations:  Problem # 1:  ANEMIA-UNSPECIFIED (ICD-285.9) Assessment New 70 YO FEMALE WITH NEW MILD NORMOCYTIC ANEMIA,HEME NEGATIVE STOOL. LAST COLONOSCOPY 2005 WAS NORMAL. NOT CLEAR THAT SOURCE OF ANEMIA IS GI RELATED. PT IS SCHEDULED FOR A HYSTERECTOMY/BSO AND BLADDER TACK NEXT WEEK-SHE IS STABLE FROM A GI STANDPOINT. SHE SHOULD HAVE A FOLLOW UP COLONOSCOPY BUT DO NOT BELIEVE THIS HAS TO BE DONE PRE-OPERATIVELY.  WILL REPEAT CBC TODAY TO ASSURE STABILITY, ADD FE STUDIES UNLESS LABS ARE CONCERNING WOULD PROCEED WITH SURGERY AND PLAN COLONOSCOPY WITH DR Juanda Chance WHEN SHE RECOVERS FROM  SURGERY.  Problem # 2:  GERD (ICD-530.81) Assessment: Comment Only STABLE  Problem # 3:  HYPERTENSION (ICD-401.9) Assessment: Comment Only  Problem # 4:  HYPERLIPIDEMIA (ICD-272.4) Assessment: Comment Only  Problem # 5:  ASTHMA (ICD-493.90) Assessment: Comment Only  Other Orders: TLB-Ferritin (82728-FER) TLB-IBC Pnl (Iron/FE;Transferrin) (83550-IBC) TLB-CBC Platelet - w/Differential (85025-CBCD)  Patient Instructions: 1)  Please go to the basement to have your lab tests drawn today. 2)  We will call you with these results. 3)  Copy sent to : Maryelizabeth Rowan, MD, Duard Larsen, MD 4)  The medication list was reviewed and reconciled.  All changed / newly prescribed medications were explained.  A complete medication list was provided to the patient / caregiver.

## 2010-10-18 NOTE — Progress Notes (Signed)
Summary: Triage--Anemia  Phone Note From Other Clinic   Caller: [facility]  [phone] Call For: Dr. Juanda Chance Summary of Call: Pt. is Anemic and needs an appt. before next year.  Initial call taken by: Karna Christmas,  August 27, 2009 2:26 PM  Follow-up for Phone Call        Pt. will see Willette Cluster NP on 08-31-09 at [facility] will advise pt. of appt/med.list/co-pay/cx.policy and she will fax records. Follow-up by: Laureen Ochs LPN,  August 27, 2009 2:54 PM       Appended Document: Triage--Anemia Pt. was a 'No Show' for her appt.today, I notified [name] at [employer].

## 2010-12-04 LAB — URINALYSIS, ROUTINE W REFLEX MICROSCOPIC
Bilirubin Urine: NEGATIVE
Glucose, UA: NEGATIVE mg/dL
Hgb urine dipstick: NEGATIVE
Ketones, ur: NEGATIVE mg/dL
Nitrite: NEGATIVE
Protein, ur: NEGATIVE mg/dL
Specific Gravity, Urine: 1.01 (ref 1.005–1.030)
Urobilinogen, UA: 0.2 mg/dL (ref 0.0–1.0)
pH: 6 (ref 5.0–8.0)

## 2010-12-04 LAB — CBC
HCT: 27.3 % — ABNORMAL LOW (ref 36.0–46.0)
HCT: 27.8 % — ABNORMAL LOW (ref 36.0–46.0)
HCT: 34.3 % — ABNORMAL LOW (ref 36.0–46.0)
Hemoglobin: 11.7 g/dL — ABNORMAL LOW (ref 12.0–15.0)
Hemoglobin: 9.3 g/dL — ABNORMAL LOW (ref 12.0–15.0)
Hemoglobin: 9.4 g/dL — ABNORMAL LOW (ref 12.0–15.0)
MCHC: 33.9 g/dL (ref 30.0–36.0)
MCHC: 34 g/dL (ref 30.0–36.0)
MCHC: 34.2 g/dL (ref 30.0–36.0)
MCV: 87.8 fL (ref 78.0–100.0)
MCV: 88.4 fL (ref 78.0–100.0)
MCV: 88.4 fL (ref 78.0–100.0)
Platelets: 241 10*3/uL (ref 150–400)
Platelets: 247 10*3/uL (ref 150–400)
Platelets: 317 10*3/uL (ref 150–400)
RBC: 3.09 MIL/uL — ABNORMAL LOW (ref 3.87–5.11)
RBC: 3.14 MIL/uL — ABNORMAL LOW (ref 3.87–5.11)
RBC: 3.9 MIL/uL (ref 3.87–5.11)
RDW: 13.6 % (ref 11.5–15.5)
RDW: 13.7 % (ref 11.5–15.5)
RDW: 13.7 % (ref 11.5–15.5)
WBC: 14.1 10*3/uL — ABNORMAL HIGH (ref 4.0–10.5)
WBC: 19.3 10*3/uL — ABNORMAL HIGH (ref 4.0–10.5)
WBC: 8.5 10*3/uL (ref 4.0–10.5)

## 2010-12-04 LAB — COMPREHENSIVE METABOLIC PANEL
ALT: 14 U/L (ref 0–35)
AST: 16 U/L (ref 0–37)
Albumin: 4.1 g/dL (ref 3.5–5.2)
Alkaline Phosphatase: 91 U/L (ref 39–117)
BUN: 23 mg/dL (ref 6–23)
CO2: 29 mEq/L (ref 19–32)
Calcium: 9.6 mg/dL (ref 8.4–10.5)
Chloride: 101 mEq/L (ref 96–112)
Creatinine, Ser: 1 mg/dL (ref 0.4–1.2)
GFR calc Af Amer: >60 mL/min (ref >60)
GFR calc non Af Amer: 55 mL/min — ABNORMAL LOW (ref >60)
Glucose, Bld: 111 mg/dL — ABNORMAL HIGH (ref 70–99)
Potassium: 3.4 mEq/L — ABNORMAL LOW (ref 3.5–5.1)
Sodium: 138 mEq/L (ref 135–145)
Total Bilirubin: 0.4 mg/dL (ref 0.3–1.2)
Total Protein: 7.5 g/dL (ref 6.0–8.3)

## 2010-12-04 LAB — DIFFERENTIAL
Basophils Absolute: 0.1 10*3/uL (ref 0.0–0.1)
Basophils Relative: 0 % (ref 0–1)
Eosinophils Absolute: 0.2 10*3/uL (ref 0.0–0.7)
Eosinophils Relative: 2 % (ref 0–5)
Lymphocytes Relative: 21 % (ref 12–46)
Lymphs Abs: 2.9 10*3/uL (ref 0.7–4.0)
Monocytes Absolute: 0.9 10*3/uL (ref 0.1–1.0)
Monocytes Relative: 6 % (ref 3–12)
Neutro Abs: 10 10*3/uL — ABNORMAL HIGH (ref 1.7–7.7)
Neutrophils Relative %: 71 % (ref 43–77)

## 2010-12-04 LAB — BASIC METABOLIC PANEL
BUN: 20 mg/dL (ref 6–23)
CO2: 29 mEq/L (ref 19–32)
Calcium: 8.3 mg/dL — ABNORMAL LOW (ref 8.4–10.5)
Chloride: 103 mEq/L (ref 96–112)
Creatinine, Ser: 1.04 mg/dL (ref 0.4–1.2)
GFR calc Af Amer: >60 mL/min (ref >60)
GFR calc non Af Amer: 52 mL/min — ABNORMAL LOW (ref >60)
Glucose, Bld: 141 mg/dL — ABNORMAL HIGH (ref 70–99)
Potassium: 4.2 mEq/L (ref 3.5–5.1)
Sodium: 138 mEq/L (ref 135–145)

## 2010-12-04 LAB — PROTIME-INR
INR: 0.95 (ref 0.00–1.49)
Prothrombin Time: 12.6 seconds (ref 11.6–15.2)

## 2010-12-04 LAB — APTT: aPTT: 25 seconds (ref 24–37)

## 2011-01-11 ENCOUNTER — Other Ambulatory Visit: Payer: Self-pay | Admitting: Orthopedic Surgery

## 2011-01-11 DIAGNOSIS — M25561 Pain in right knee: Secondary | ICD-10-CM

## 2011-01-12 ENCOUNTER — Ambulatory Visit
Admission: RE | Admit: 2011-01-12 | Discharge: 2011-01-12 | Disposition: A | Discharge status: Home / Self Care | Payer: Self-pay | Source: Ambulatory Visit | Attending: Orthopedic Surgery | Admitting: Orthopedic Surgery

## 2011-01-12 DIAGNOSIS — M25561 Pain in right knee: Secondary | ICD-10-CM

## 2011-01-12 IMAGING — CR DG KNEE 1-2V*R*
2 series · 2 of 2 positions shown · non-contrast
Comparison: None

CLINICAL DATA: Right knee pain, decreased range of motion, no
injury

RIGHT KNEE - 1-2 VIEW

[view not recorded (1 of 2)]
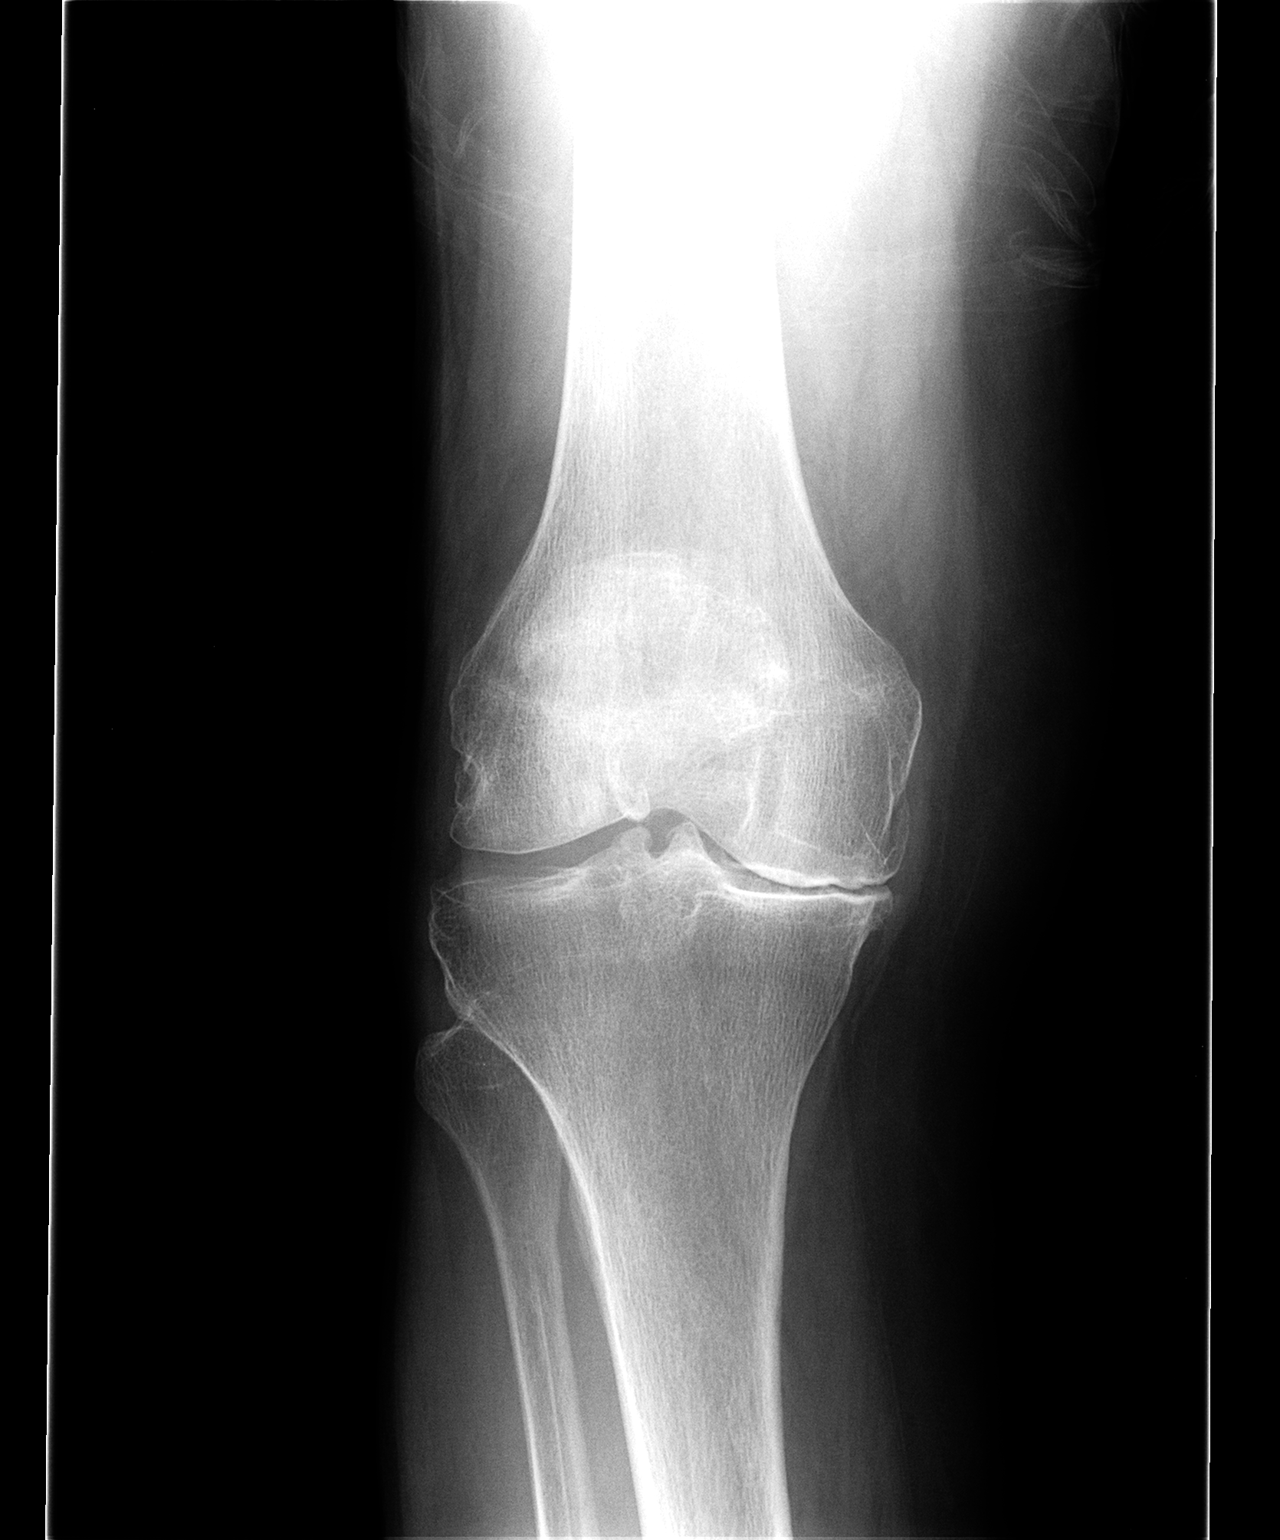

[view not recorded (2 of 2)]
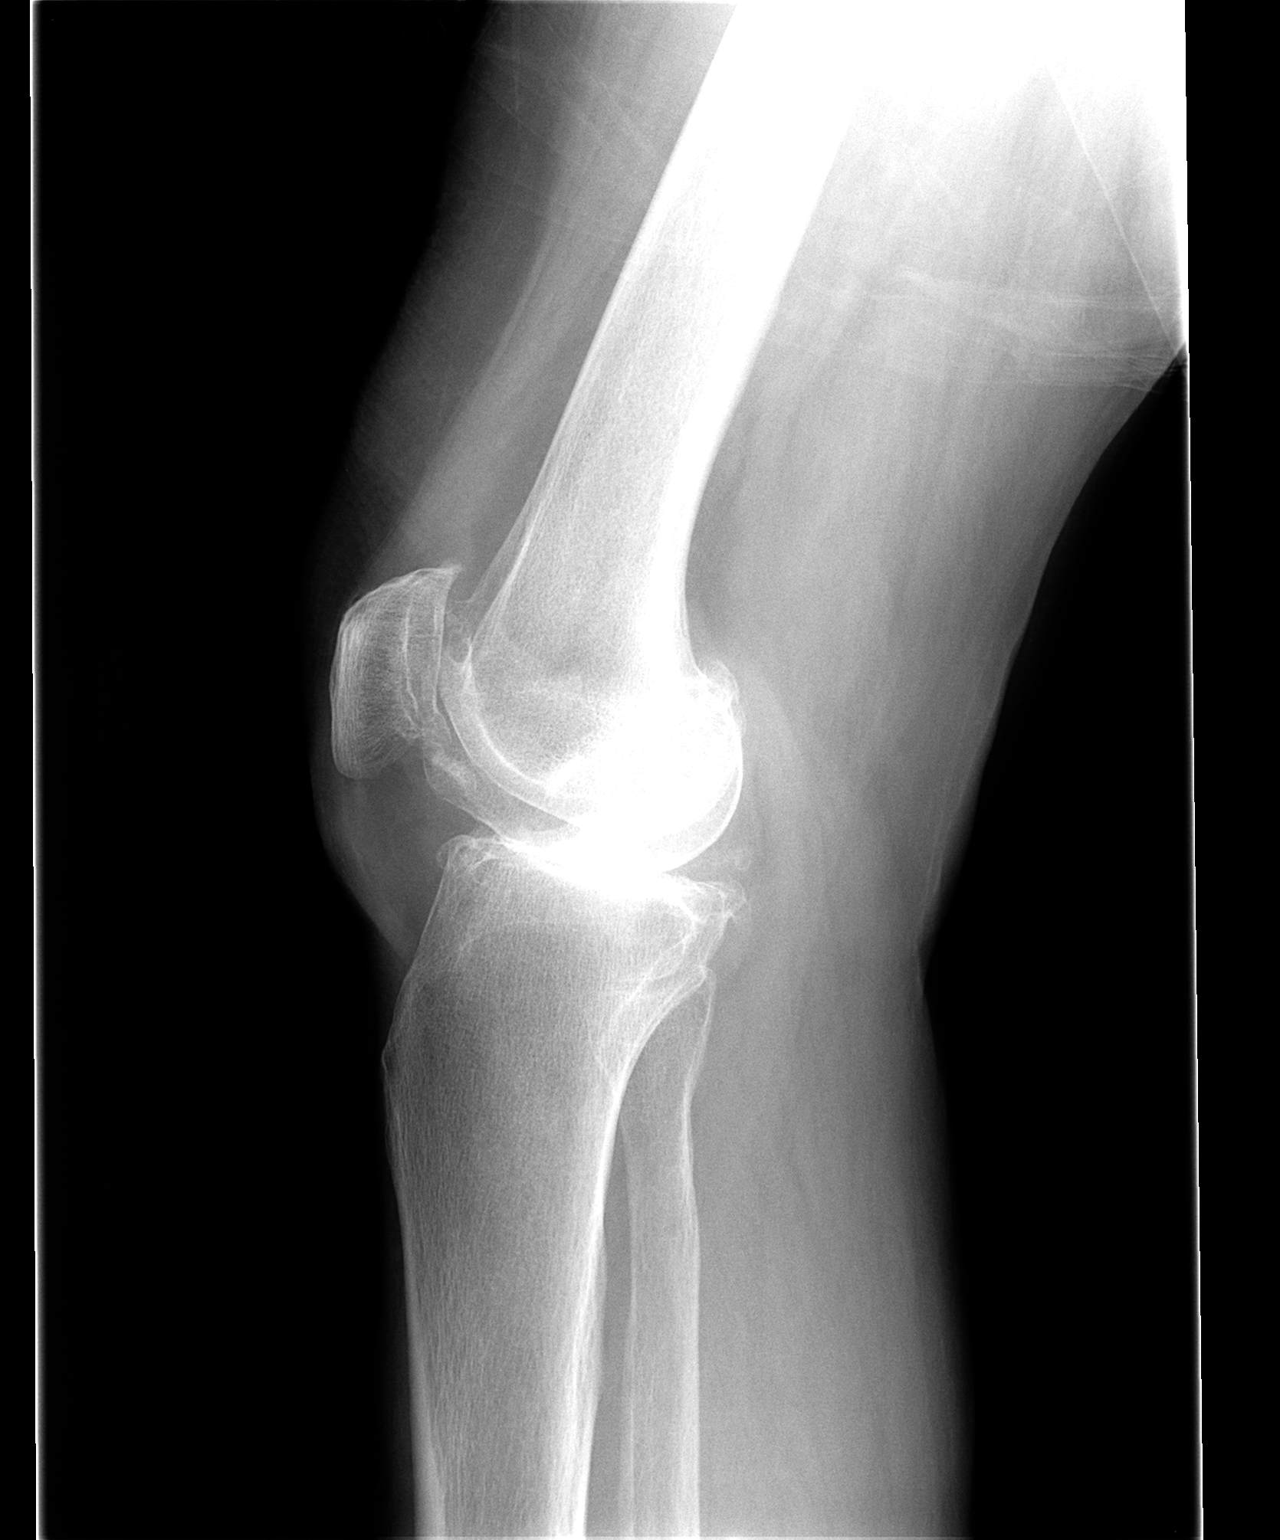

[2 of 2 positions shown; findings below may reference images not displayed]

FINDINGS: There is bicompartmental degenerative joint disease
primarily involving the medial compartment where there is loss of
joint space, sclerosis, and spurring.  Lesser degenerative changes
noted involving the patellofemoral articulation with some loss of
joint space and spurring.  No fracture is seen.  No effusion is
noted.
IMPRESSION: Bicompartmental degenerative joint disease.

## 2011-02-03 NOTE — Op Note (Signed)
[name], [name]                         ACCOUNT NO.:  [id number]   MEDICAL RECORD NO.:  [id number]                   PATIENT TYPE:  AMB   LOCATION:  DSC                                  FACILITY:  [facility]   PHYSICIAN:  Lubertha Basque. Jerl Santos, M.D.             DATE OF BIRTH:  [birth year]   DATE OF PROCEDURE:  10/07/2002  DATE OF DISCHARGE:                                 OPERATIVE REPORT   PREOPERATIVE DIAGNOSES:  1. Right shoulder impingement.  2. Right shoulder acromioclavicular degeneration.  3. Right shoulder rotator cuff tear.   POSTOPERATIVE DIAGNOSES:  1. Right shoulder impingement.  2. Right shoulder acromioclavicular degeneration.  3. Right shoulder rotator cuff tear.   PROCEDURE:  1. Right shoulder arthroscopic acromioplasty.  2. Right shoulder arthroscopic AC resection.  3. Right shoulder open rotator cuff repair.   ANESTHESIA:  General and block.   ATTENDING SURGEON:  Lubertha Basque. Jerl Santos, M.D.   ASSISTANT:  Lindwood Qua, P.A.   INDICATION FOR PROCEDURE:  The patient is a 70 year old woman with a long  history of right shoulder pain.  This has persisted despite oral anti-  inflammatories and an exercise program.  She did respond transiently to an  injection.  She has undergone an MRI scan which shows a large rotator cuff  tear which appears to be retracted.  She also has significant AC  degeneration and signs of impingement.  She was offered an arthroscopy with  possible open repair of her tear versus an arthroscopic repair.  The  procedures were discussed with the patient, and informed operative consent  was obtained after discussion of the possible complications, reactions to  anesthesia, and infection.   DESCRIPTION OF PROCEDURE:  The patient was taken to the operating suite  where general anesthetic was applied without difficulty.  She was also given  a block in the pre-anesthesia area.  She was positioned in beachchair  position and prepped and draped  in the usual sterile fashion.  After  instillation of preoperative antibiotics, an arthroscopy of the right  shoulder was performed through three portals.  The glenohumeral joint showed  no degenerative changes, and the biceps tendon and labral structures were  all intact.  She had an obvious large tear seen from below involving the  entire supraspinatus and about a half of the infraspinatus at the greater  tuberosity attachment.  This was retracted a centimeter or two.  In the  subacromial space, she had a prominent subacromial morphology addressed with  an acromioplasty back to the flat surface.  This was done with the bur in  the lateral position followed by transfer of bur to the posterior position.  She also had bone-on-bone contact at the [facility] joint with a large spur below  this.  The spur was excised followed by a formal AC decompression through  the additional anterior portal.  The rotator cuff was examined, and it was  difficult to reapproximate this easily to the greater tuberosity.  It was  felt that this would be best approached in an open fashion.  As a result,  the lateral portal was extended in both directions down to the deltoid, and  a longitudinal split was made in this structure to expose the underlying  rotator cuff.  The exposure was fairly difficult as the cuff was retracted a  bit.  I used the finger and a Cobb to free up the tissue and placed some tag  sutures in the leading edge of the rotator cuff.  Eventually, I was able to  reapproximate this to the greater tuberosity.  It was felt to repair it in  this location would lead to too much tension on the repair, so I decided to  medialize the repair almost a centimeter.  I removed some articular  cartilage with a rongeur and bur and created a bleeding bed of bone in an  area where the repair would be under no tension.  Two of the absorbable 6.0  anchors were then placed with two suture wires emanating from each.  These   were passed through the rotator cuff and were utilized to suture the cuff  back down to this bleeding bed of bone.  A good tight repair was achieved  with no tension.  The wound was irrigated followed by reapproximation of the  two layers of deltoid fascia with 0 Vicryl suture in interrupted fashion.  Subcutaneous tissue was reapproximated with 2-0 undyed Vicryl, followed by  skin closure with nylon.  Adaptic was placed over the wound, followed by dry  gauze and tape.  Estimated blood loss and fluids can be obtained from  Anesthesia records.   DISPOSITION:  The patient was extubated in the operating room and taken to  the recovery room in stable condition.  The plans were for her to go home  the same day and follow up in the office in one week.  I will contact her by  phone tonight.                                               Lubertha Basque Jerl Santos, M.D.    PGD/MEDQ  D:  10/07/2002  T:  10/07/2002  Job:  [id number]

## 2011-02-03 NOTE — Assessment & Plan Note (Signed)
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  Previously consistent with a  diagnosis of cough-variant asthma that only seemed to respond to prednisone,  but now has been maintained on Qvar two puffs b.i.d. and was doing fine  until three days ago with the onset of nasal congestion and drainage.  She  did not follow the instructions as written on her p.r.n. list for Mucinex DM  or __________  and comes in with somewhat of a congested rattling cough that  is minimally productive.  She is also not using the flutter valve that was  written on a p.r.n. basis.   However, she is maintained on the same medications that she was previously  and these are inventoried on the column dated March 30, 2006 and directly  correlate with the medication calendar that she carries with her.   She denies any pleuritic pain, increased dyspnea, fevers, chills, sweats,  orthopnea, PND, or leg swelling.   PHYSICAL EXAMINATION:  GENERAL:  She is a pleasant, ambulatory white female  in no acute distress.  VITAL SIGNS:  Stable vital signs.  HEENT:  Minimal turbinate edema.  Oropharynx is clear with no evidence of  excessive postnasal drainage but there is mild cobblestoning.  NECK:  Supple without cervical adenopathy or tenderness.  Trachea is  midline.  LUNGS:  Lung fields reveal minimal rhonchi bilateral.  Air movement is  adequate.  HEART:  There is regular rhythm without murmurs, rubs, or gallops.  ABDOMEN:  Soft.  EXTREMITIES:  Warm without calf tenderness, cyanosis, clubbing, edema.   IMPRESSION:  Chronic cough associated with a tendency to asthmatic  bronchitis with previously documented positive  methacholine challenge test.  In this setting the patient needs to understand contingency plans better and  I read line by line all the p.r.n.'s from her medication calendar to help  her understand how to take medicines on a p.r.n. basis based on the symptoms  that she is having.  This is an extremely user-friendly approach that  emphasizes the symptom connected to each recommendation for specific  symptoms.   For today I did recommend a six-day course of prednisone but I would like to  see her back if she either does not control the cough with the six-day  course or if the cough flares back up after the prednisone taper is  completed.  Otherwise, we will see her every six weeks.                                   Charlaine Dalton. Sherene Sires, MD, [facility]   MBW/MedQ  DD:  03/30/2006  DT:  03/30/2006  Job #:  [id number]   cc:   Stacie Acres.  Cliffton Asters, MD

## 2011-02-03 NOTE — Op Note (Signed)
NAME:  [name], [name]                         ACCOUNT NO.:  [id number]   MEDICAL RECORD NO.:  [id number]                   PATIENT TYPE:  AMB   LOCATION:  ENDO                                 FACILITY:  [facility]   PHYSICIAN:  Graylin Shiver, M.D.                DATE OF BIRTH:  1941/04/23   DATE OF PROCEDURE:  01/06/2004  DATE OF DISCHARGE:                                 OPERATIVE REPORT   PROCEDURE:  Colonoscopy.   INDICATIONS:  Screening. Informed consent was obtained after explanation of  risks such as bleeding, infection and perforation.   PREMEDICATION:  Fentanyl 70 mcg IV, Versed 8mg  IV.   DESCRIPTION OF PROCEDURE:  With the patient the left lateral decubitus  position, rectal exam was performed, no masses were felt. The Olympus  colonoscope was inserted into the rectum, advanced around the colon to the  cecum.  Cecal landmarks were identified. The cecum and ascending colon were  normal. The transverse colon was norma. The descending colon and sigmoid  revealed diverticulosis.  The rectum was normal. She tolerated the procedure  well without complications.   IMPRESSION:  Diverticulosis of the left colon.                                               Graylin Shiver, M.D.    Germain Osgood  D:  01/06/2004  T:  01/06/2004  Job:  [id number]   cc:   Stacie Acres. White, M.D.  [address]  [place]  [place] [address]  Fax: [phone]

## 2011-02-03 NOTE — Op Note (Signed)
NAME:  [name], STEINBERG NO.:  [id number]   MEDICAL RECORD NO.:  [id number]          PATIENT TYPE:  AMB   LOCATION:                               FACILITY:  [facility]   PHYSICIAN:  Lina Sar, M.D. [facility]  DATE OF BIRTH:  [birth year]   DATE OF PROCEDURE:  01/17/2005  DATE OF DISCHARGE:                                 OPERATIVE REPORT   INDICATIONS:  This 70 year old female has had a chronic cough which has not  improved on high dose proton pump inhibitor. Upper endoscopy on December 06, 2004 showed normal exam. Biopsies from the GE junction shows mild chronic  esophagitis consistent with reflux. The patient has had allergist evaluation  as well as pulmonary evaluation by Dr. Nyoka Cowden. A 24-hour pH  Persantine is done while taking AcipHex 20 mg a day.   DESCRIPTION OF PROCEDURE:  The dual channel esophageal probe was placed  through the nares into the esophagus and was placed at 5 and 10 cm proximal  to the lower esophageal sphincter. In the proximal probe, there were a total  of 9 reflux episodes during a 24-hour period of time, all occurred in  upright position. There was one episode lasting less than four minutes. The  total percent of the time pH was less than 4 with 0.1%. The longest episode  lasted two minutes.   In the distal probe, the patient had a total of 16 reflux episodes all  occurred in the upright position and the longest episode lasted one minute.  Persantine pH was less than 0.4 with 0.6%. The composite score showed a  total of 17 reflux episodes with total score of 0.7, normal being less than  50.   IMPRESSION:  This is a normal intraesophageal pH probe while the patient  takes AcipHex 20 mg a day.      DB/MEDQ  D:  02/20/2005  T:  02/20/2005  Job:  [id number]   cc:   Stacie Acres. White, M.D.  [address]  [place]  [place] [address]  Fax: [phone]   Sidney Ace, M.D. [facility]  [address]  [place]  [place] [address]  Fax:  [phone]

## 2011-06-12 LAB — POCT CARDIAC MARKERS
CKMB, poc: <1 — ABNORMAL LOW
Myoglobin, poc: 46.3
Operator id: 222501
Troponin i, poc: <0.05

## 2011-06-12 LAB — DIFFERENTIAL
Basophils Absolute: 0.1
Basophils Relative: 1
Eosinophils Absolute: 0.1
Eosinophils Relative: 1
Lymphocytes Relative: 24
Lymphs Abs: 3
Monocytes Absolute: 0.8
Monocytes Relative: 6
Neutro Abs: 8.8 — ABNORMAL HIGH
Neutrophils Relative %: 69

## 2011-06-12 LAB — CBC
HCT: 34.7 — ABNORMAL LOW
Hemoglobin: 11.8 — ABNORMAL LOW
MCHC: 34.1
MCV: 85.5
Platelets: 299
RBC: 4.06
RDW: 14
WBC: 12.8 — ABNORMAL HIGH

## 2011-06-12 LAB — I-STAT 8, (EC8 V) (CONVERTED LAB)
Acid-base deficit: 1
BUN: 16
Bicarbonate: 24.9 — ABNORMAL HIGH
Chloride: 105
Glucose, Bld: 99
HCT: 39
Hemoglobin: 13.3
Operator id: 285841
Potassium: 4
Sodium: 138
TCO2: 26
pCO2, Ven: 42.7 — ABNORMAL LOW
pH, Ven: 7.372 — ABNORMAL HIGH

## 2011-06-12 LAB — POCT I-STAT CREATININE
Creatinine, Ser: 1.1
Operator id: 285841

## 2012-01-05 DIAGNOSIS — M5136 Other intervertebral disc degeneration, lumbar region: Secondary | ICD-10-CM | POA: Insufficient documentation

## 2012-01-18 DIAGNOSIS — E1149 Type 2 diabetes mellitus with other diabetic neurological complication: Secondary | ICD-10-CM | POA: Insufficient documentation

## 2012-11-19 ENCOUNTER — Encounter (HOSPITAL_COMMUNITY): Payer: Self-pay | Admitting: Respiratory Therapy

## 2012-11-27 ENCOUNTER — Other Ambulatory Visit (HOSPITAL_COMMUNITY): Payer: Self-pay | Admitting: Orthopedic Surgery

## 2012-11-27 ENCOUNTER — Encounter (HOSPITAL_COMMUNITY)
Admission: RE | Admit: 2012-11-27 | Discharge: 2012-11-27 | Disposition: A | Discharge status: Home / Self Care | Payer: Medicare PPO | Source: Ambulatory Visit | Attending: Orthopedic Surgery | Admitting: Orthopedic Surgery

## 2012-11-27 ENCOUNTER — Encounter (HOSPITAL_COMMUNITY): Payer: Self-pay

## 2012-11-27 ENCOUNTER — Ambulatory Visit (HOSPITAL_COMMUNITY)
Admission: RE | Admit: 2012-11-27 | Discharge: 2012-11-27 | Disposition: A | Discharge status: Home / Self Care | Payer: Medicare PPO | Source: Ambulatory Visit | Attending: Orthopedic Surgery | Admitting: Orthopedic Surgery

## 2012-11-27 DIAGNOSIS — Z01812 Encounter for preprocedural laboratory examination: Secondary | ICD-10-CM | POA: Insufficient documentation

## 2012-11-27 DIAGNOSIS — Z01818 Encounter for other preprocedural examination: Secondary | ICD-10-CM | POA: Insufficient documentation

## 2012-11-27 DIAGNOSIS — Z01810 Encounter for preprocedural cardiovascular examination: Secondary | ICD-10-CM | POA: Insufficient documentation

## 2012-11-27 HISTORY — DX: Unspecified asthma, uncomplicated: J45.909

## 2012-11-27 HISTORY — DX: Unspecified osteoarthritis, unspecified site: M19.90

## 2012-11-27 HISTORY — DX: Essential (primary) hypertension: I10

## 2012-11-27 LAB — PROTIME-INR
INR: 1.01 (ref 0.00–1.49)
Prothrombin Time: 13.2 seconds (ref 11.6–15.2)

## 2012-11-27 LAB — COMPREHENSIVE METABOLIC PANEL
ALT: 7 U/L (ref 0–35)
AST: 16 U/L (ref 0–37)
Albumin: 3.8 g/dL (ref 3.5–5.2)
Alkaline Phosphatase: 90 U/L (ref 39–117)
BUN: 21 mg/dL (ref 6–23)
CO2: 26 mEq/L (ref 19–32)
Calcium: 9.9 mg/dL (ref 8.4–10.5)
Chloride: 100 mEq/L (ref 96–112)
Creatinine, Ser: 1.04 mg/dL (ref 0.50–1.10)
GFR calc Af Amer: 60 mL/min — ABNORMAL LOW (ref >90)
GFR calc non Af Amer: 52 mL/min — ABNORMAL LOW (ref >90)
Glucose, Bld: 138 mg/dL — ABNORMAL HIGH (ref 70–99)
Potassium: 4.2 mEq/L (ref 3.5–5.1)
Sodium: 139 mEq/L (ref 135–145)
Total Bilirubin: 0.4 mg/dL (ref 0.3–1.2)
Total Protein: 7.5 g/dL (ref 6.0–8.3)

## 2012-11-27 LAB — URINE MICROSCOPIC-ADD ON

## 2012-11-27 LAB — URINALYSIS, ROUTINE W REFLEX MICROSCOPIC
Bilirubin Urine: NEGATIVE
Glucose, UA: NEGATIVE mg/dL
Ketones, ur: NEGATIVE mg/dL
Nitrite: NEGATIVE
Protein, ur: NEGATIVE mg/dL
Specific Gravity, Urine: 1.015 (ref 1.005–1.030)
Urobilinogen, UA: 0.2 mg/dL (ref 0.0–1.0)
pH: 6.5 (ref 5.0–8.0)

## 2012-11-27 LAB — SURGICAL PCR SCREEN
MRSA, PCR: NEGATIVE
Staphylococcus aureus: NEGATIVE

## 2012-11-27 LAB — ABO/RH: ABO/RH(D): B POS

## 2012-11-27 LAB — CBC
HCT: 33.3 % — ABNORMAL LOW (ref 36.0–46.0)
Hemoglobin: 11.4 g/dL — ABNORMAL LOW (ref 12.0–15.0)
MCH: 28.4 pg (ref 26.0–34.0)
MCHC: 34.2 g/dL (ref 30.0–36.0)
MCV: 82.8 fL (ref 78.0–100.0)
Platelets: 372 10*3/uL (ref 150–400)
RBC: 4.02 MIL/uL (ref 3.87–5.11)
RDW: 13.2 % (ref 11.5–15.5)
WBC: 13.4 10*3/uL — ABNORMAL HIGH (ref 4.0–10.5)

## 2012-11-27 LAB — TYPE AND SCREEN
ABO/RH(D): B POS
Antibody Screen: NEGATIVE

## 2012-11-27 LAB — APTT: aPTT: 31 seconds (ref 24–37)

## 2012-11-27 IMAGING — CR DG CHEST 2V
2 series · 2 of 2 positions shown · non-contrast
Comparison: 10/20/2008

CLINICAL DATA: Preop radiograph

CHEST - 2 VIEW

[view not recorded (1 of 2)]
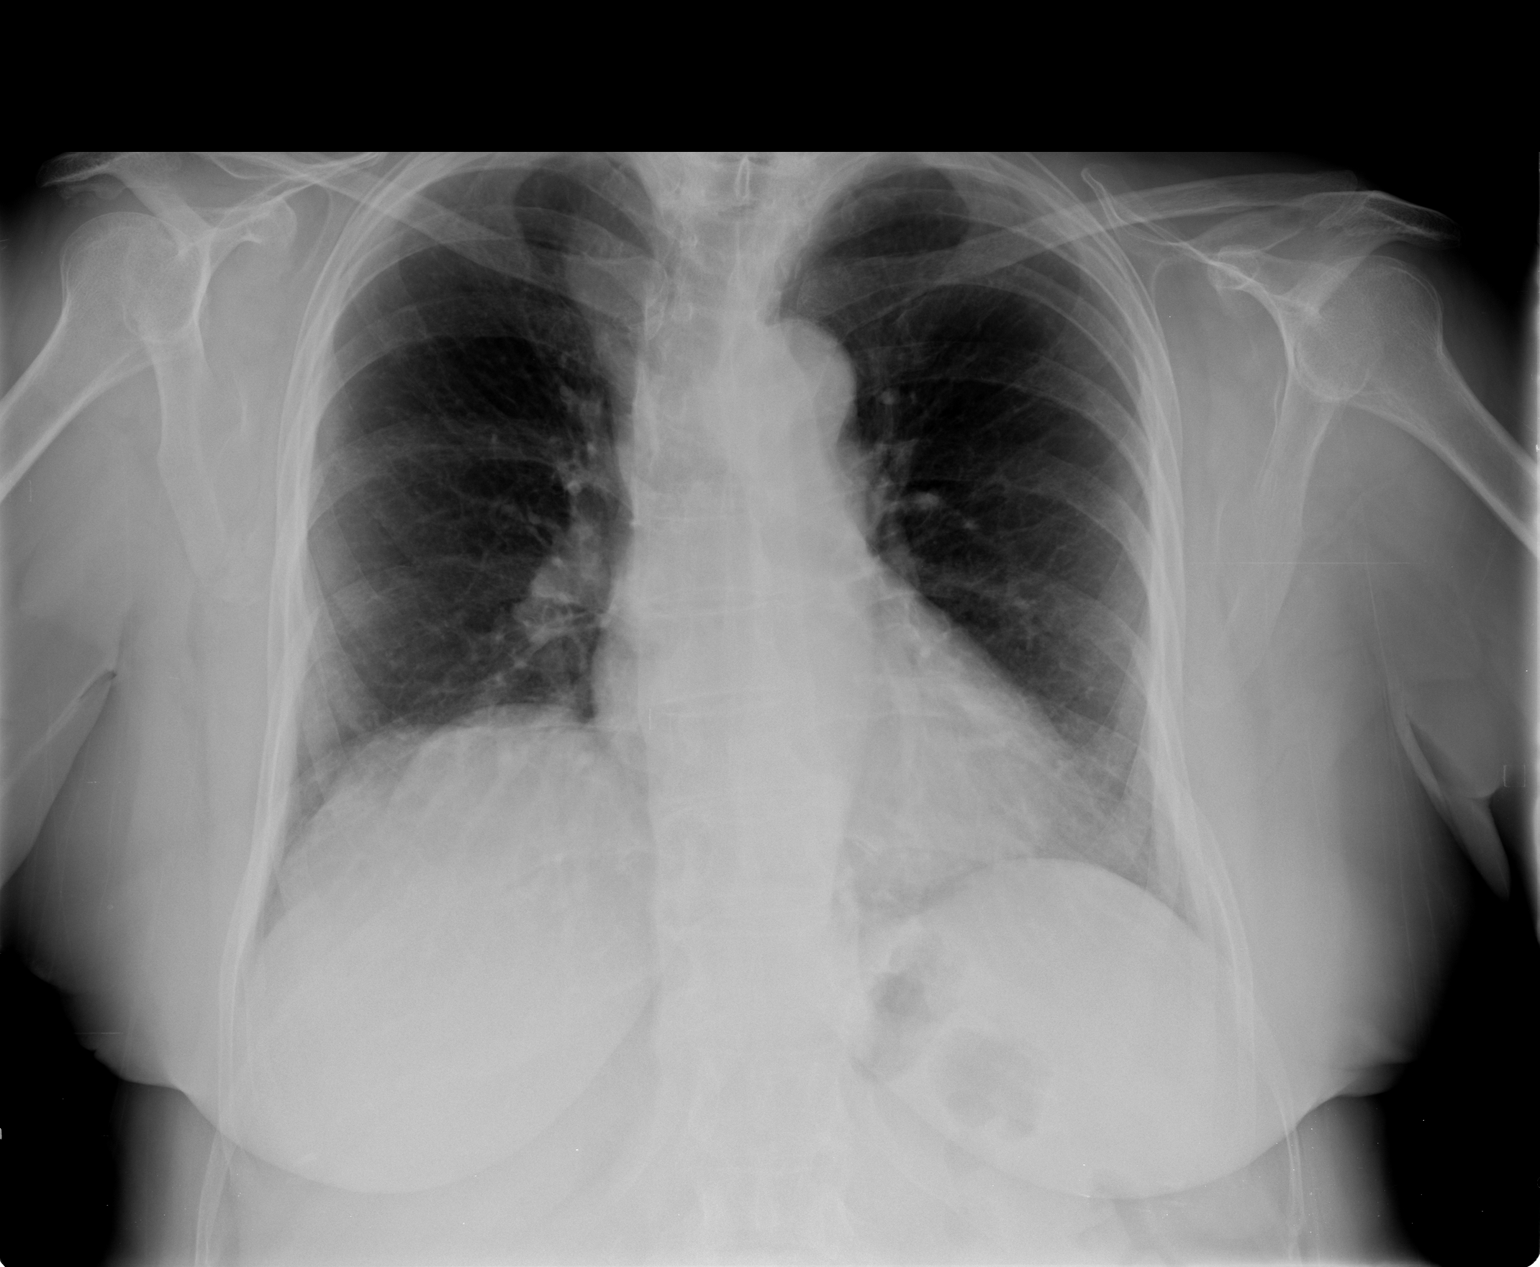

[view not recorded (2 of 2)]
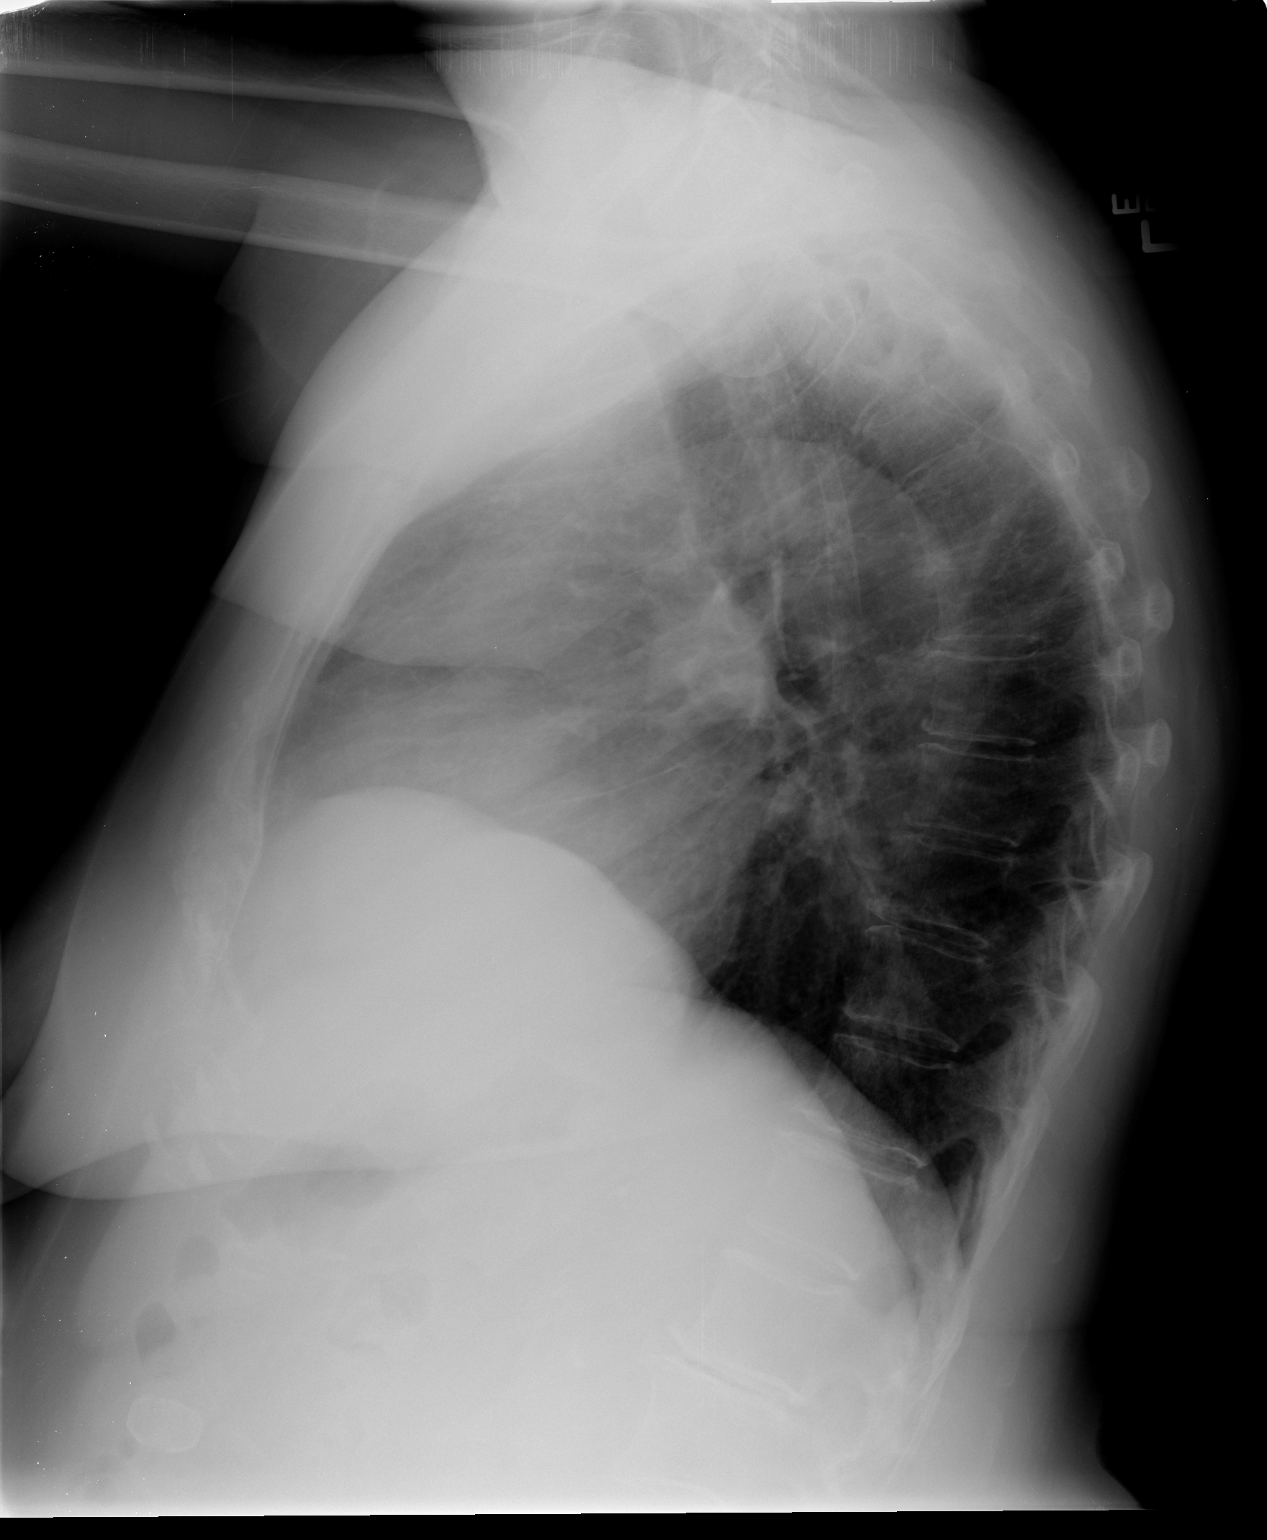

[2 of 2 positions shown; findings below may reference images not displayed]

FINDINGS: Normal heart size.  There is asymmetric elevation of the
right hemidiaphragm.  No airspace consolidation noted.   Review of
the visualized osseous structures is significant for mild
spondylosis within the thoracic spine.
IMPRESSION: 1.  No acute cardiopulmonary abnormalities

## 2012-11-27 NOTE — Pre-Procedure Instructions (Signed)
SHAQUOIA MIERS  11/27/2012   Your procedure is scheduled on:  12/04/12  Report to [facility] at 830 AM.  Call this number if you have problems the morning of surgery: [phone]   Remember:   Do not eat food or drink liquids after midnight.   Take these medicines the morning of surgery with A SIP OF WATER: all inhalers,vicodan   Do not wear jewelry, make-up or nail polish.  Do not wear lotions, powders, or perfumes. You may wear deodorant.  Do not shave 48 hours prior to surgery. Men may shave face and neck.  Do not bring valuables to the hospital.  Contacts, dentures or bridgework may not be worn into surgery.  Leave suitcase in the car. After surgery it may be brought to your room.  For patients admitted to the hospital, checkout time is 11:00 AM the day of  discharge.   Patients discharged the day of surgery will not be allowed to drive  home.  Name and phone number of your driver: family  Special Instructions: Shower using CHG 2 nights before surgery and the night before surgery.  If you shower the day of surgery use CHG.  Use special wash - you have one bottle of CHG for all showers.  You should use approximately 1/3 of the bottle for each shower.   Please read over the following fact sheets that you were given: Pain Booklet, Coughing and Deep Breathing, Blood Transfusion Information, MRSA Information and Surgical Site Infection Prevention

## 2012-12-03 MED ORDER — CEFAZOLIN SODIUM-DEXTROSE 2-3 GM-% IV SOLR
2.0000 g | INTRAVENOUS | Status: AC
  Administered 2012-12-04: 2 g via INTRAVENOUS
  Filled 2012-12-03: qty 50

## 2012-12-04 ENCOUNTER — Encounter (HOSPITAL_COMMUNITY): Payer: Self-pay | Admitting: *Deleted

## 2012-12-04 ENCOUNTER — Inpatient Hospital Stay (HOSPITAL_COMMUNITY): Payer: Medicare PPO

## 2012-12-04 ENCOUNTER — Encounter (HOSPITAL_COMMUNITY): Payer: Self-pay | Admitting: Certified Registered"

## 2012-12-04 ENCOUNTER — Inpatient Hospital Stay (HOSPITAL_COMMUNITY)
Admission: RE | Admit: 2012-12-04 | Discharge: 2012-12-07 | DRG: 470 | Disposition: A | Discharge status: Home Health | Payer: Medicare PPO | Source: Ambulatory Visit | Attending: Orthopedic Surgery | Admitting: Orthopedic Surgery

## 2012-12-04 ENCOUNTER — Encounter (HOSPITAL_COMMUNITY)
Admission: RE | Disposition: A | Discharge status: Home Health | Payer: Self-pay | Source: Ambulatory Visit | Attending: Orthopedic Surgery

## 2012-12-04 ENCOUNTER — Inpatient Hospital Stay (HOSPITAL_COMMUNITY): Payer: Medicare PPO | Admitting: Certified Registered"

## 2012-12-04 DIAGNOSIS — M1710 Unilateral primary osteoarthritis, unspecified knee: Principal | ICD-10-CM | POA: Diagnosis present

## 2012-12-04 DIAGNOSIS — Z96651 Presence of right artificial knee joint: Secondary | ICD-10-CM

## 2012-12-04 DIAGNOSIS — E785 Hyperlipidemia, unspecified: Secondary | ICD-10-CM | POA: Diagnosis present

## 2012-12-04 DIAGNOSIS — Z79899 Other long term (current) drug therapy: Secondary | ICD-10-CM

## 2012-12-04 DIAGNOSIS — J45909 Unspecified asthma, uncomplicated: Secondary | ICD-10-CM | POA: Diagnosis present

## 2012-12-04 DIAGNOSIS — M810 Age-related osteoporosis without current pathological fracture: Secondary | ICD-10-CM | POA: Diagnosis present

## 2012-12-04 DIAGNOSIS — K219 Gastro-esophageal reflux disease without esophagitis: Secondary | ICD-10-CM | POA: Diagnosis present

## 2012-12-04 DIAGNOSIS — I10 Essential (primary) hypertension: Secondary | ICD-10-CM | POA: Diagnosis present

## 2012-12-04 DIAGNOSIS — Z7982 Long term (current) use of aspirin: Secondary | ICD-10-CM

## 2012-12-04 HISTORY — DX: Gastro-esophageal reflux disease without esophagitis: K21.9

## 2012-12-04 HISTORY — PX: TOTAL KNEE ARTHROPLASTY: SHX125

## 2012-12-04 IMAGING — CR DG KNEE 1-2V PORT*R*
2 series · 2 of 2 positions shown · non-contrast
Comparison: 01/12/2011.

CLINICAL DATA: Knee pain

PORTABLE RIGHT KNEE - 1-2 VIEW

[AP]
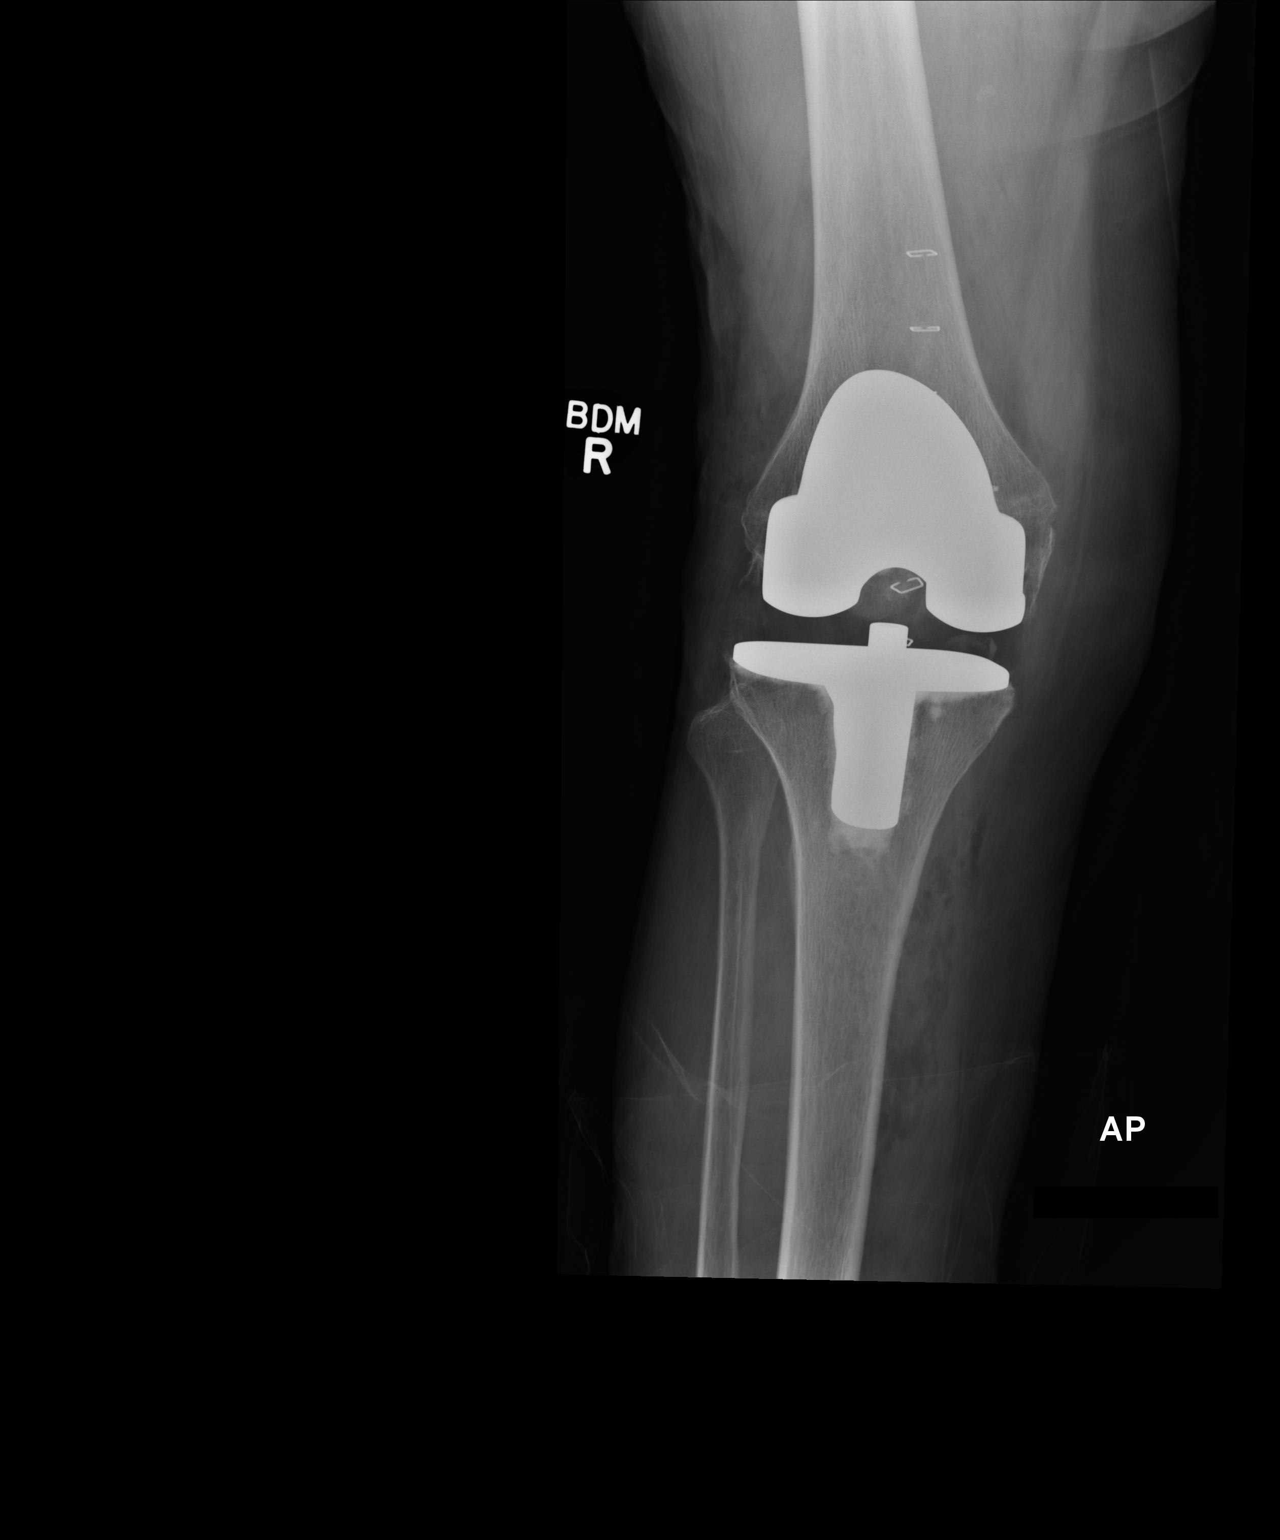

[xtable lateral]
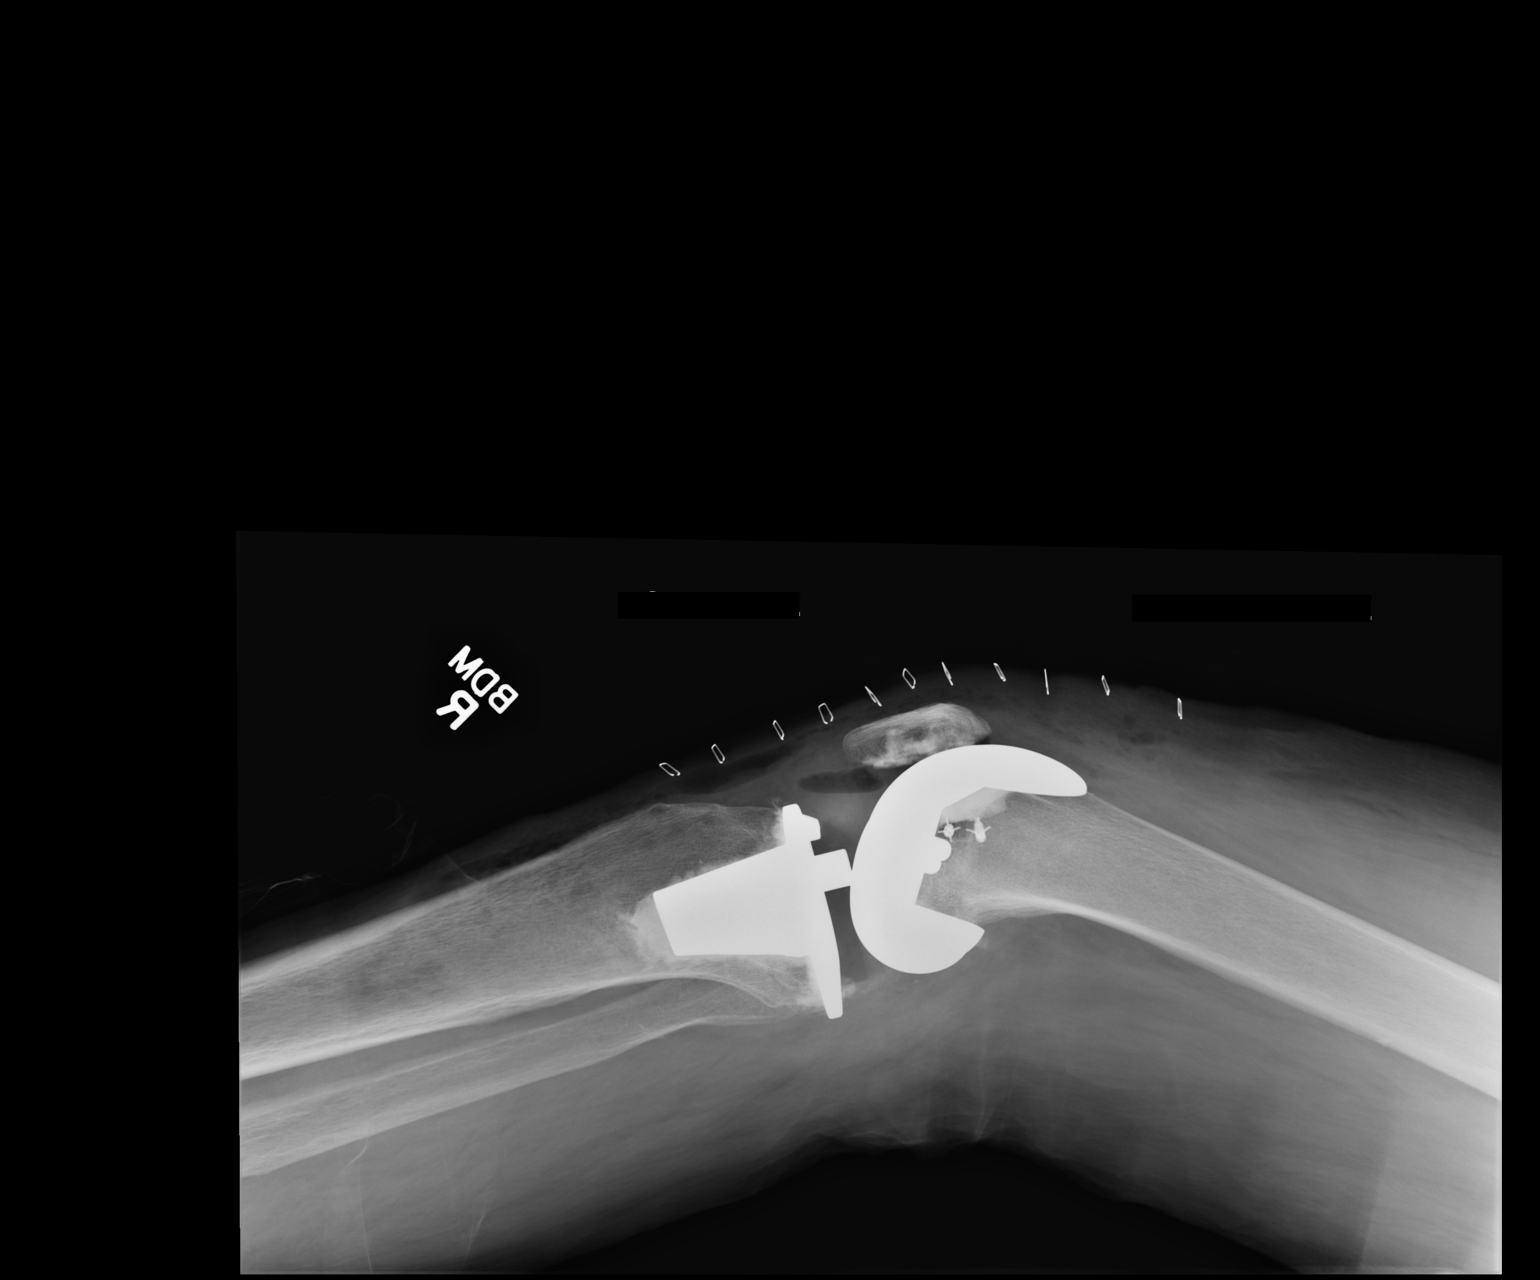

[2 of 2 positions shown; findings below may reference images not displayed]

FINDINGS: Satisfactory appearance status post right TKR.  No
adverse features.
IMPRESSION: As above.

## 2012-12-04 SURGERY — ARTHROPLASTY, KNEE, TOTAL
Anesthesia: General | Site: Knee | Laterality: Right | Wound class: Clean

## 2012-12-04 MED ORDER — ONDANSETRON HCL 4 MG/2ML IJ SOLN
4.0000 mg | Freq: Four times a day (QID) | INTRAMUSCULAR | Status: DC | PRN
  Administered 2012-12-05: 4 mg via INTRAVENOUS
  Filled 2012-12-04: qty 2

## 2012-12-04 MED ORDER — ALBUTEROL SULFATE HFA 108 (90 BASE) MCG/ACT IN AERS
2.0000 | INHALATION_SPRAY | Freq: Four times a day (QID) | RESPIRATORY_TRACT | Status: DC | PRN
  Filled 2012-12-04: qty 6.7

## 2012-12-04 MED ORDER — ONDANSETRON HCL 4 MG PO TABS: 4.0000 mg | ORAL_TABLET | Freq: Four times a day (QID) | ORAL | Status: DC | PRN

## 2012-12-04 MED ORDER — OXYCODONE HCL 5 MG PO TABS
5.0000 mg | ORAL_TABLET | ORAL | Status: DC | PRN
  Administered 2012-12-05: 5 mg via ORAL
  Administered 2012-12-05: 10 mg via ORAL
  Administered 2012-12-07: 5 mg via ORAL
  Filled 2012-12-04 (×2): qty 1
  Filled 2012-12-04: qty 2

## 2012-12-04 MED ORDER — HYDROCHLOROTHIAZIDE 12.5 MG PO CAPS
12.5000 mg | ORAL_CAPSULE | Freq: Every day | ORAL | Status: DC
  Administered 2012-12-04 – 2012-12-07 (×3): 12.5 mg via ORAL
  Filled 2012-12-04 (×4): qty 1

## 2012-12-04 MED ORDER — PROPOFOL 10 MG/ML IV BOLUS
INTRAVENOUS | Status: DC | PRN
  Administered 2012-12-04: 170 mg via INTRAVENOUS

## 2012-12-04 MED ORDER — PHENOL 1.4 % MT LIQD: 1.0000 | OROMUCOSAL | Status: DC | PRN

## 2012-12-04 MED ORDER — MIDAZOLAM HCL 2 MG/2ML IJ SOLN
INTRAMUSCULAR | Status: AC
  Administered 2012-12-04: 1 mg
  Filled 2012-12-04: qty 2

## 2012-12-04 MED ORDER — HYDROMORPHONE HCL PF 1 MG/ML IJ SOLN
INTRAMUSCULAR | Status: AC
  Filled 2012-12-04: qty 1

## 2012-12-04 MED ORDER — SODIUM CHLORIDE 0.9 % IV SOLN
INTRAVENOUS | Status: DC
  Administered 2012-12-04 – 2012-12-05 (×2): via INTRAVENOUS

## 2012-12-04 MED ORDER — CEFAZOLIN SODIUM 1-5 GM-% IV SOLN
1.0000 g | Freq: Four times a day (QID) | INTRAVENOUS | Status: AC
  Administered 2012-12-04 (×2): 1 g via INTRAVENOUS
  Filled 2012-12-04 (×2): qty 50

## 2012-12-04 MED ORDER — ACETAMINOPHEN 650 MG RE SUPP: 650.0000 mg | Freq: Four times a day (QID) | RECTAL | Status: DC | PRN

## 2012-12-04 MED ORDER — HYDROMORPHONE HCL PF 1 MG/ML IJ SOLN
0.2500 mg | INTRAMUSCULAR | Status: DC | PRN
  Administered 2012-12-04 (×2): 0.5 mg via INTRAVENOUS

## 2012-12-04 MED ORDER — METHOCARBAMOL 100 MG/ML IJ SOLN
500.0000 mg | Freq: Four times a day (QID) | INTRAVENOUS | Status: DC | PRN
  Filled 2012-12-04: qty 5

## 2012-12-04 MED ORDER — METHOCARBAMOL 500 MG PO TABS
ORAL_TABLET | ORAL | Status: AC
  Filled 2012-12-04: qty 1

## 2012-12-04 MED ORDER — FENTANYL CITRATE 0.05 MG/ML IJ SOLN: 100.0000 ug | Freq: Once | INTRAMUSCULAR | Status: AC

## 2012-12-04 MED ORDER — LOSARTAN POTASSIUM 50 MG PO TABS
100.0000 mg | ORAL_TABLET | Freq: Every day | ORAL | Status: DC
  Administered 2012-12-04 – 2012-12-07 (×3): 100 mg via ORAL
  Filled 2012-12-04 (×4): qty 2

## 2012-12-04 MED ORDER — METOCLOPRAMIDE HCL 5 MG/ML IJ SOLN: 5.0000 mg | Freq: Three times a day (TID) | INTRAMUSCULAR | Status: DC | PRN

## 2012-12-04 MED ORDER — PHENYLEPHRINE HCL 10 MG/ML IJ SOLN
INTRAMUSCULAR | Status: DC | PRN
  Administered 2012-12-04 (×3): 80 ug via INTRAVENOUS

## 2012-12-04 MED ORDER — MENTHOL 3 MG MT LOZG: 1.0000 | LOZENGE | OROMUCOSAL | Status: DC | PRN

## 2012-12-04 MED ORDER — ACETAMINOPHEN 10 MG/ML IV SOLN
1000.0000 mg | Freq: Once | INTRAVENOUS | Status: AC | PRN
  Administered 2012-12-04: 1000 mg via INTRAVENOUS

## 2012-12-04 MED ORDER — FENTANYL CITRATE 0.05 MG/ML IJ SOLN
INTRAMUSCULAR | Status: DC | PRN
  Administered 2012-12-04 (×2): 50 ug via INTRAVENOUS
  Administered 2012-12-04: 100 ug via INTRAVENOUS

## 2012-12-04 MED ORDER — ASPIRIN EC 325 MG PO TBEC
325.0000 mg | DELAYED_RELEASE_TABLET | Freq: Every day | ORAL | Status: DC
  Administered 2012-12-05 – 2012-12-07 (×3): 325 mg via ORAL
  Filled 2012-12-04 (×5): qty 1

## 2012-12-04 MED ORDER — ACETAMINOPHEN 10 MG/ML IV SOLN
INTRAVENOUS | Status: AC
  Filled 2012-12-04: qty 100

## 2012-12-04 MED ORDER — ONDANSETRON HCL 4 MG/2ML IJ SOLN: 4.0000 mg | Freq: Once | INTRAMUSCULAR | Status: DC | PRN

## 2012-12-04 MED ORDER — LACTATED RINGERS IV SOLN
INTRAVENOUS | Status: DC
  Administered 2012-12-04 (×2): via INTRAVENOUS

## 2012-12-04 MED ORDER — ATORVASTATIN CALCIUM 40 MG PO TABS
40.0000 mg | ORAL_TABLET | Freq: Every day | ORAL | Status: DC
  Administered 2012-12-04 – 2012-12-06 (×3): 40 mg via ORAL
  Filled 2012-12-04 (×4): qty 1

## 2012-12-04 MED ORDER — METOCLOPRAMIDE HCL 10 MG PO TABS: 5.0000 mg | ORAL_TABLET | Freq: Three times a day (TID) | ORAL | Status: DC | PRN

## 2012-12-04 MED ORDER — ONDANSETRON HCL 4 MG/2ML IJ SOLN
INTRAMUSCULAR | Status: DC | PRN
  Administered 2012-12-04: 4 mg via INTRAVENOUS

## 2012-12-04 MED ORDER — HYDROMORPHONE HCL PF 1 MG/ML IJ SOLN: 1.0000 mg | INTRAMUSCULAR | Status: DC | PRN

## 2012-12-04 MED ORDER — SODIUM CHLORIDE 0.9 % IR SOLN
Status: DC | PRN
  Administered 2012-12-04: 1000 mL
  Administered 2012-12-04: 3000 mL

## 2012-12-04 MED ORDER — METHOCARBAMOL 500 MG PO TABS
500.0000 mg | ORAL_TABLET | Freq: Four times a day (QID) | ORAL | Status: DC | PRN
  Administered 2012-12-04 – 2012-12-07 (×2): 500 mg via ORAL
  Filled 2012-12-04: qty 1

## 2012-12-04 MED ORDER — MIDAZOLAM HCL 5 MG/ML IJ SOLN: 2.0000 mg | Freq: Once | INTRAMUSCULAR | Status: DC

## 2012-12-04 MED ORDER — HYDROCODONE-ACETAMINOPHEN 7.5-325 MG PO TABS
1.0000 | ORAL_TABLET | Freq: Four times a day (QID) | ORAL | Status: DC
  Administered 2012-12-04 – 2012-12-07 (×11): 1 via ORAL
  Filled 2012-12-04 (×12): qty 1

## 2012-12-04 MED ORDER — FENTANYL CITRATE 0.05 MG/ML IJ SOLN
INTRAMUSCULAR | Status: AC
  Administered 2012-12-04: 50 ug via INTRAVENOUS
  Filled 2012-12-04: qty 2

## 2012-12-04 MED ORDER — ACETAMINOPHEN 325 MG PO TABS: 650.0000 mg | ORAL_TABLET | Freq: Four times a day (QID) | ORAL | Status: DC | PRN

## 2012-12-04 MED ORDER — LIDOCAINE HCL (CARDIAC) 20 MG/ML IV SOLN
INTRAVENOUS | Status: DC | PRN
  Administered 2012-12-04: 50 mg via INTRAVENOUS

## 2012-12-04 SURGICAL SUPPLY — 61 items
ANCH SUT 2 CP-2 EBND QANCHR+ (Anchor) ×2 IMPLANT
ANCHOR SUPER QUICK (Anchor) ×4 IMPLANT
BLADE SAGITTAL 25.0X1.27X90 (BLADE) ×2 IMPLANT
BLADE SAW SGTL 13.0X1.19X90.0M (BLADE) ×2 IMPLANT
BLADE SURG 21 STRL SS (BLADE) IMPLANT
BNDG COHESIVE 6X5 TAN STRL LF (GAUZE/BANDAGES/DRESSINGS) ×2 IMPLANT
BONE CEMENT PALACOSE (Orthopedic Implant) ×4 IMPLANT
BOWL SMART MIX CTS (DISPOSABLE) ×2 IMPLANT
CEMENT BONE PALACOSE (Orthopedic Implant) ×2 IMPLANT
CLOTH BEACON ORANGE TIMEOUT ST (SAFETY) ×2 IMPLANT
COVER SURGICAL LIGHT HANDLE (MISCELLANEOUS) ×2 IMPLANT
CUFF TOURNIQUET SINGLE 34IN LL (TOURNIQUET CUFF) ×2 IMPLANT
CUFF TOURNIQUET SINGLE 44IN (TOURNIQUET CUFF) IMPLANT
DRAPE EXTREMITY T 121X128X90 (DRAPE) ×2 IMPLANT
DRAPE PROXIMA HALF (DRAPES) ×2 IMPLANT
DRAPE U-SHAPE 47X51 STRL (DRAPES) ×2 IMPLANT
DRSG ADAPTIC 3X8 NADH LF (GAUZE/BANDAGES/DRESSINGS) ×2 IMPLANT
DRSG PAD ABDOMINAL 8X10 ST (GAUZE/BANDAGES/DRESSINGS) ×2 IMPLANT
DURAPREP 26ML APPLICATOR (WOUND CARE) ×2 IMPLANT
ELECT REM PT RETURN 9FT ADLT (ELECTROSURGICAL) ×2
ELECTRODE REM PT RTRN 9FT ADLT (ELECTROSURGICAL) ×1 IMPLANT
FACESHIELD LNG OPTICON STERILE (SAFETY) ×4 IMPLANT
GLOVE BIO SURGEON STRL SZ 6.5 (GLOVE) ×2 IMPLANT
GLOVE BIO SURGEON STRL SZ8.5 (GLOVE) ×6 IMPLANT
GLOVE BIOGEL PI IND STRL 7.5 (GLOVE) ×1 IMPLANT
GLOVE BIOGEL PI IND STRL 9 (GLOVE) ×1 IMPLANT
GLOVE BIOGEL PI INDICATOR 7.5 (GLOVE) ×1
GLOVE BIOGEL PI INDICATOR 9 (GLOVE) ×1
GLOVE SURG ORTHO 9.0 STRL STRW (GLOVE) ×4 IMPLANT
GLOVE SURG SS PI 8.5 STRL IVOR (GLOVE) ×2
GLOVE SURG SS PI 8.5 STRL STRW (GLOVE) ×2 IMPLANT
GOWN PREVENTION PLUS XLARGE (GOWN DISPOSABLE) IMPLANT
GOWN SRG XL XLNG 56XLVL 4 (GOWN DISPOSABLE) ×2 IMPLANT
GOWN STRL NON-REIN XL XLG LVL4 (GOWN DISPOSABLE) ×4
GOWN STRL REIN 2XL XLG LVL4 (GOWN DISPOSABLE) ×2 IMPLANT
HANDPIECE INTERPULSE COAX TIP (DISPOSABLE) ×2
IMMOBILIZER KNEE 20 (SOFTGOODS) ×2
IMMOBILIZER KNEE 20 THIGH 36 (SOFTGOODS) ×1 IMPLANT
KIT BASIN OR (CUSTOM PROCEDURE TRAY) ×2 IMPLANT
KIT ROOM TURNOVER OR (KITS) ×2 IMPLANT
MANIFOLD NEPTUNE II (INSTRUMENTS) ×2 IMPLANT
NEEDLE SPNL 18GX3.5 QUINCKE PK (NEEDLE) ×2 IMPLANT
NS IRRIG 1000ML POUR BTL (IV SOLUTION) ×2 IMPLANT
PACK TOTAL JOINT (CUSTOM PROCEDURE TRAY) ×2 IMPLANT
PAD ARMBOARD 7.5X6 YLW CONV (MISCELLANEOUS) ×4 IMPLANT
PADDING CAST COTTON 6X4 STRL (CAST SUPPLIES) ×2 IMPLANT
SET HNDPC FAN SPRY TIP SCT (DISPOSABLE) IMPLANT
SPONGE GAUZE 4X4 12PLY (GAUZE/BANDAGES/DRESSINGS) ×2 IMPLANT
STAPLER VISISTAT 35W (STAPLE) ×2 IMPLANT
SUCTION FRAZIER TIP 10 FR DISP (SUCTIONS) IMPLANT
SUT ETHIBOND NAB CT1 #1 30IN (SUTURE) ×2 IMPLANT
SUT FIBERWIRE #2 38 T-5 BLUE (SUTURE) ×2
SUT VIC AB 0 CTB1 27 (SUTURE) ×4 IMPLANT
SUT VIC AB 1 CTX 36 (SUTURE) ×4
SUT VIC AB 1 CTX36XBRD ANBCTR (SUTURE) ×2 IMPLANT
SUTURE FIBERWR #2 38 T-5 BLUE (SUTURE) ×1 IMPLANT
TOWEL OR 17X24 6PK STRL BLUE (TOWEL DISPOSABLE) ×2 IMPLANT
TOWEL OR 17X26 10 PK STRL BLUE (TOWEL DISPOSABLE) ×2 IMPLANT
TRAY FOLEY CATH 14FR (SET/KITS/TRAYS/PACK) ×2 IMPLANT
WATER STERILE IRR 1000ML POUR (IV SOLUTION) ×2 IMPLANT
WRAP KNEE MAXI GEL POST OP (GAUZE/BANDAGES/DRESSINGS) ×2 IMPLANT

## 2012-12-04 NOTE — Progress Notes (Signed)
Report given to philip rn as caregiver 

## 2012-12-04 NOTE — Transfer of Care (Signed)
Immediate Anesthesia Transfer of Care Note  Patient: [name]  Procedure(s) Performed: Procedure(s) with comments: TOTAL KNEE ARTHROPLASTY (Right) - Right Total Knee Arthroplasty  Patient Location: PACU  Anesthesia Type:General and Regional  Level of Consciousness: awake, alert  and oriented  Airway & Oxygen Therapy: Patient Spontanous Breathing and Patient connected to face mask oxygen  Post-op Assessment: Report given to PACU RN and Post -op Vital signs reviewed and stable  Post vital signs: Reviewed and stable  Complications: No apparent anesthesia complications

## 2012-12-04 NOTE — Plan of Care (Signed)
Problem: Consults Goal: Diagnosis- Total Joint Replacement Primary Total Knee Right     

## 2012-12-04 NOTE — H&P (Signed)
TOTAL KNEE ADMISSION H&P  Patient is being admitted for right total knee arthroplasty.  Subjective:  Chief Complaint:right knee pain.  HPI: [name], 72 y.o. female, has a history of pain and functional disability in the right knee due to arthritis and has failed non-surgical conservative treatments for greater than 12 weeks to includeNSAID's and/or analgesics, corticosteriod injections, viscosupplementation injections, flexibility and strengthening excercises, use of assistive devices and activity modification.  Onset of symptoms was gradual, starting 8 years ago with gradually worsening course since that time. The patient noted no past surgery on the right knee(s).  Patient currently rates pain in the right knee(s) at 8 out of 10 with activity. Patient has night pain, worsening of pain with activity and weight bearing, pain that interferes with activities of daily living, pain with passive range of motion, crepitus and joint swelling.  Patient has evidence of subchondral sclerosis, periarticular osteophytes and joint space narrowing by imaging studies. This patient has had Failure of conservative care.. There is no active infection.  Patient Active Problem List   Diagnosis Date Noted  . Unspecified anemia 09/14/2009  . GERD 09/14/2009  . VITAMIN D DEFICIENCY 09/02/2009  . HYPERLIPIDEMIA 09/02/2009  . HYPERTENSION 09/02/2009  . ALLERGIC RHINITIS 09/02/2009  . ASTHMA 09/02/2009  . REFLUX ESOPHAGITIS 09/02/2009  . OSTEOPENIA 09/02/2009  . 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 Asthma   . Arthritis   . Hypertension     pcp   Leisure centre manager       Past Surgical History  Procedure Laterality Date  . Rotator cuff repair    . Abdominal hysterectomy      No prescriptions prior to admission   Allergies  Allergen Reactions  . Biaxin (Clarithromycin) Other (See Comments)    Elevated heart rate    History  Substance Use Topics  . Smoking  status: Never Smoker   . Smokeless tobacco: Not on file  . Alcohol Use: No    No family history on file.   Review of Systems  All other systems reviewed and are negative.    Objective:  Physical Exam  Vital signs in last 24 hours:    Labs:   Estimated body mass index is 27.61 kg/(m^2) as calculated from the following:   Height as of 09/14/09: 5\' 6"  (1.676 m).   Weight as of 11/29/09: 77.565 kg (171 lb).   Imaging Review Plain radiographs demonstrate moderate degenerative joint disease of the right knee(s). The overall alignment ismild varus. The bone quality appears to be adequate for age and reported activity level.  Assessment/Plan:  End stage arthritis, right knee   The patient history, physical examination, clinical judgment of the provider and imaging studies are consistent with end stage degenerative joint disease of the right knee(s) and total knee arthroplasty is deemed medically necessary. The treatment options including medical management, injection therapy arthroscopy and arthroplasty were discussed at length. The risks and benefits of total knee arthroplasty were presented and reviewed. The risks due to aseptic loosening, infection, stiffness, patella tracking problems, thromboembolic complications and other imponderables were discussed. The patient acknowledged the explanation, agreed to proceed with the plan and consent was signed. Patient is being admitted for inpatient treatment for surgery, pain control, PT, OT, prophylactic antibiotics, VTE prophylaxis, progressive ambulation and ADL's and discharge planning. The patient is planning to be discharged home with home health services

## 2012-12-04 NOTE — Op Note (Signed)
OPERATIVE REPORT  DATE OF SURGERY: 12/04/2012  PATIENT:  [name],  72 y.o. female  PRE-OPERATIVE DIAGNOSIS:  Osteoarthritis Right Knee  POST-OPERATIVE DIAGNOSIS:  Osteoarthritis Right Knee Avulsion of the medial collateral ligament off the medial femoral condyle  PROCEDURE:  Procedure(s): TOTAL KNEE ARTHROPLASTY Zimmer components. Size D. femur. Size 4 tibia. 12 mm polyethylene tray. 29 mm patella. Reconstruction medial collateral ligament.  SURGEON:  Surgeon(s): Nadara Mustard, MD  ANESTHESIA:   general  EBL:  min ML  SPECIMEN:  No Specimen  TOURNIQUET:    PROCEDURE DETAILS: Patient is a 72 year old woman with osteoarthritis of her right knee with varus collapse who presents at this time for total knee arthroplasty. Risks and benefits were discussed including infection neurovascular injury pain DVT need for additional surgery. Patient states she understands and wished to proceed at this time. Description of procedure patient brought to the operating room after femoral block she then underwent a general anesthetic. After adequate levels of anesthesia were obtained patient's right lower extremity was prepped using DuraPrep draped into a sterile field an [place] was used to cover all exposed skin. A midline incision was made carried down through a medial parapatellar retinacular incision. She underwent an IM alignment for the femoral component and 10 mm was taken off the distal femur. This was set in 6 of valgus. Attention was then taken on the tibia and 10 mm was taken off the tibia with 7 posterior slope neutral varus and valgus. The tibia was sized for a size 4 and the cuts were made for the size 4 tibia. Chamfer cuts were then made for a size D. femur box cuts were made and this was trialed with a 12 mm polyethylene trial. This was stable varus and valgus. The patella was then resurfaced and 29 mm patella was prepared. During removal of the implants the MCL attachment on the  medial femoral condyle avulsed with bony fragment. The knee was irrigated with pulsatile lavage meniscus tissue was removed. The tibial and femoral components were cemented in place with the 12 mm poly-tray. Using 2 Mytec anchors the MCL was was repaired back to the medial femoral condyle. Patient had good stability with range of motion 0-45. Patella tracked midline. All loose cement was removed the knee was irrigated with pulsatile lavage. The retinaculum was closed using #1 Vicryl subcutaneous is closed using 0 Vicryl the skin was closed using staples. The wound is covered Adaptic orthopedic sponges AB dressing Kerlix and Coban. Patient was extubated taken to the PACU in stable condition.  PLAN OF CARE: Admit to inpatient   PATIENT DISPOSITION:  PACU - hemodynamically stable.   Nadara Mustard, MD 12/04/2012 11:48 AM

## 2012-12-04 NOTE — Anesthesia Postprocedure Evaluation (Signed)
  Anesthesia Post-op Note  Patient: [name]  Procedure(s) Performed: Procedure(s) with comments: TOTAL KNEE ARTHROPLASTY (Right) - Right Total Knee Arthroplasty  Patient Location: PACU  Anesthesia Type:General and GA combined with regional for post-op pain  Level of Consciousness: awake, alert  and oriented  Airway and Oxygen Therapy: Patient Spontanous Breathing and Patient connected to nasal cannula oxygen  Post-op Pain: mild  Post-op Assessment: Post-op Vital signs reviewed, Patient's Cardiovascular Status Stable, Respiratory Function Stable, Patent Airway, No signs of Nausea or vomiting and Pain level controlled  Post-op Vital Signs: stable  Complications: No apparent anesthesia complications

## 2012-12-04 NOTE — Anesthesia Preprocedure Evaluation (Addendum)
Anesthesia Evaluation  Patient identified by MRN, date of birth, ID band Patient awake    Reviewed: Allergy & Precautions, H&P , NPO status , Patient's Chart, lab work & pertinent test results  Airway Mallampati: I TM Distance: >3 FB Neck ROM: Full    Dental  (+) Missing, Caps, Dental Advisory Given and Teeth Intact   Pulmonary  breath sounds clear to auscultation        Cardiovascular hypertension, Pt. on medications Rhythm:Regular Rate:Normal     Neuro/Psych    GI/Hepatic GERD-  Medicated and Controlled,  Endo/Other    Renal/GU      Musculoskeletal   Abdominal   Peds  Hematology   Anesthesia Other Findings   Reproductive/Obstetrics                          Anesthesia Physical Anesthesia Plan  ASA: II  Anesthesia Plan: General   Post-op Pain Management:    Induction: Intravenous  Airway Management Planned: LMA  Additional Equipment:   Intra-op Plan:   Post-operative Plan:   Informed Consent: I have reviewed the patients History and Physical, chart, labs and discussed the procedure including the risks, benefits and alternatives for the proposed anesthesia with the patient or authorized representative who has indicated his/her understanding and acceptance.   Dental advisory given  Plan Discussed with: Anesthesiologist and Surgeon  Anesthesia Plan Comments:        Anesthesia Quick Evaluation

## 2012-12-04 NOTE — Progress Notes (Signed)
Utilization review completed. Alnita Aybar, RN, BSN. 

## 2012-12-05 ENCOUNTER — Encounter (HOSPITAL_COMMUNITY): Payer: Self-pay | Admitting: General Practice

## 2012-12-05 LAB — BASIC METABOLIC PANEL
BUN: 18 mg/dL (ref 6–23)
CO2: 29 mEq/L (ref 19–32)
Calcium: 8.8 mg/dL (ref 8.4–10.5)
Chloride: 98 mEq/L (ref 96–112)
Creatinine, Ser: 1.04 mg/dL (ref 0.50–1.10)
GFR calc Af Amer: 60 mL/min — ABNORMAL LOW (ref >90)
GFR calc non Af Amer: 52 mL/min — ABNORMAL LOW (ref >90)
Glucose, Bld: 156 mg/dL — ABNORMAL HIGH (ref 70–99)
Potassium: 4 mEq/L (ref 3.5–5.1)
Sodium: 135 mEq/L (ref 135–145)

## 2012-12-05 LAB — CBC
HCT: 29 % — ABNORMAL LOW (ref 36.0–46.0)
Hemoglobin: 9.6 g/dL — ABNORMAL LOW (ref 12.0–15.0)
MCH: 28.1 pg (ref 26.0–34.0)
MCHC: 33.1 g/dL (ref 30.0–36.0)
MCV: 84.8 fL (ref 78.0–100.0)
Platelets: 307 10*3/uL (ref 150–400)
RBC: 3.42 MIL/uL — ABNORMAL LOW (ref 3.87–5.11)
RDW: 13.4 % (ref 11.5–15.5)
WBC: 11.1 10*3/uL — ABNORMAL HIGH (ref 4.0–10.5)

## 2012-12-05 NOTE — Evaluation (Signed)
Occupational Therapy Evaluation Patient Details Name: [name] MRN: [id number] DOB: 1941/03/14 Today's Date: 12/05/2012 Time: 4098-1191 OT Time Calculation (min): 12 min  OT Assessment / Plan / Recommendation Clinical Impression  Pt s/p R TKA. Will benefit from acute OT services to address below problem list to ensure safe return home.    OT Assessment  Patient needs continued OT Services    Follow Up Recommendations  No OT follow up;Supervision/Assistance - 24 hour    Barriers to Discharge None    Equipment Recommendations   (tbd)    Recommendations for Other Services    Frequency  Min 2X/week    Precautions / Restrictions Precautions Precautions: Fall Restrictions Weight Bearing Restrictions: Yes RLE Weight Bearing: Weight bearing as tolerated   Pertinent Vitals/Pain See vitals   ADL  Upper Body Bathing: Simulated;Set up Where Assessed - Upper Body Bathing: Unsupported sitting Lower Body Bathing: Simulated;Moderate assistance Where Assessed - Lower Body Bathing: Unsupported sitting Upper Body Dressing: Simulated;Set up Where Assessed - Upper Body Dressing: Unsupported sitting Lower Body Dressing: Maximal assistance;Performed Where Assessed - Lower Body Dressing: Unsupported sitting Transfers/Ambulation Related to ADLs: pt declining due to pain ADL Comments: Session limited by pain    OT Diagnosis: Generalized weakness;Acute pain  OT Problem List: Decreased strength;Decreased activity tolerance;Decreased knowledge of use of DME or AE;Pain OT Treatment Interventions: Self-care/ADL training;DME and/or AE instruction;Therapeutic activities;Patient/family education   OT Goals Acute Rehab OT Goals OT Goal Formulation: With patient Time For Goal Achievement: 12/12/12 Potential to Achieve Goals: Good ADL Goals Pt Will Perform Lower Body Dressing: with min assist;Sit to stand from chair;Sit to stand from bed;Other (comment) (caregiver independent in  assisting) ADL Goal: Lower Body Dressing - Progress: Goal set today Pt Will Transfer to Toilet: with supervision;Ambulation;with DME;Comfort height toilet ADL Goal: Toilet Transfer - Progress: Goal set today Pt Will Perform Toileting - Clothing Manipulation: with supervision;Standing ADL Goal: Toileting - Clothing Manipulation - Progress: Goal set today Pt Will Perform Toileting - Hygiene: with supervision;Sit to stand from 3-in-1/toilet ADL Goal: Toileting - Hygiene - Progress: Goal set today Pt Will Perform Tub/Shower Transfer: Shower transfer;with min assist;Ambulation;with DME ADL Goal: Tub/Shower Transfer - Progress: Goal set today  Visit Information  Last OT Received On: 12/05/12    Subjective Data      Prior Functioning     Home Living Lives With: Spouse;Daughter Available Help at Discharge: Family;Available 24 hours/day Type of Home: House Home Access: Ramped entrance;Stairs to enter Entrance Stairs-Number of Steps: 3 Entrance Stairs-Rails: Left Home Layout: One level Bathroom Shower/Tub: Walk-in shower;Door [employer] Toilet: Standard Bathroom Accessibility: Yes How Accessible: Accessible via walker Home Adaptive Equipment: Raised toilet seat with rails;Walker - rolling Prior Function Level of Independence: Independent Able to Take Stairs?: Yes Driving: Yes Vocation: 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         Vision/Perception     Copywriter, advertising Overall Cognitive Status: Appears within functional limits for tasks assessed/performed Arousal/Alertness: Awake/alert Orientation Level: Appears intact for tasks assessed Behavior During Session: [facility] for tasks performed    Extremity/Trunk Assessment Right Upper Extremity Assessment RUE ROM/Strength/Tone: Within functional levels Left Upper Extremity Assessment LUE ROM/Strength/Tone: Within functional levels     Mobility Bed Mobility Bed Mobility: Supine to Sit;Sitting - Scoot to  Edge of Bed;Sit to Supine Supine to Sit: 3: Mod assist;With rails;HOB elevated Sitting - Scoot to Edge of Bed: 3: Mod assist Sit to Supine: 3: Mod assist;HOB elevated Details for Bed Mobility Assistance: Assist to support RLE  and trunk out/in bed. Increased time due to pain. Transfers Transfers: Not assessed     Exercise     Balance     End of Session OT - End of Session Activity Tolerance: Patient limited by pain Patient left: in bed;with call bell/phone within reach;with family/visitor present  GO   12/05/2012 Cipriano Mile OTR/L Pager [phone] Office [phone]    Cipriano Mile 12/05/2012, 3:52 PM

## 2012-12-05 NOTE — Progress Notes (Signed)
Orthopedic Tech Progress Note Patient Details:  [name] 01/14/1941 [id number] Patient has knee immobilizer.  Patient ID: [name], female   DOB: 1941/04/23, 72 y.o.   MRN: [id number]   [name] 12/05/2012, 8:31 AM

## 2012-12-05 NOTE — Progress Notes (Signed)
Patient ID: [name], female   DOB: [birth year], 72 y.o.   MRN: [id number] Postoperative day 1 right total knee arthroplasty. Hemoglobin 9.6. Patient had avulsion off the medial femoral condyle of the MCL. We will keep her in the knee immobilizer. Okay for gentle passive motion to 45. Weightbearing as tolerated with the knee immobilizer in place.

## 2012-12-05 NOTE — Evaluation (Signed)
Physical Therapy Evaluation Patient Details Name: [name] MRN: [id number] DOB: 01-01-1941 Today's Date: 12/05/2012 Time: 7829-5621 PT Time Calculation (min): 26 min  PT Assessment / Plan / Recommendation Clinical Impression  Pt is a 72 y/o female s/p R TKA. Pt lives with spouse and daughter and will have 21 /7 assist/supervision.  Acute PT to follow pt to promote mobilty for safe d/c to home.     PT Assessment  Patient needs continued PT services    Follow Up Recommendations  Home health PT;Supervision/Assistance - 24 hour    Does the patient have the potential to tolerate intense rehabilitation      Barriers to Discharge None None    Equipment Recommendations  None recommended by PT    Recommendations for Other Services     Frequency 7X/week    Precautions / Restrictions Precautions Precautions: Fall Restrictions Weight Bearing Restrictions: Yes RLE Weight Bearing: Weight bearing as tolerated   Pertinent Vitals/Pain 8/10 pain in R knee RN notified.  Pt not yet due for pain medication.        Mobility  Bed Mobility Bed Mobility: Supine to Sit;Sitting - Scoot to Edge of Bed Supine to Sit: 3: Mod assist;HOB elevated Sitting - Scoot to Edge of Bed: 3: Mod assist Sit to Supine: Not Tested (comment) Details for Bed Mobility Assistance: Assist for R LE and trunk secondary to dizziness and posterior lean in sitting.   Transfers Transfers: Sit to Stand;Stand to [employer] Transfers Sit to Stand: 3: Mod assist Stand to Sit: 3: Mod assist Stand Pivot Transfers: 3: Mod assist (2 trials from bed-->3 in1-->recliner.) Details for Transfer Assistance: Manual facilitaiton to shift wt forward and steady pt.  VCs for hand placement and safe technique.  Ambulation/Gait Ambulation/Gait Assistance: Not tested (comment) Stairs: No    Exercises Total Joint Exercises Ankle Circles/Pumps: Both;10 reps Heel Slides: Right;5 reps;Supine   PT Diagnosis: Difficulty  walking;Acute pain;Generalized weakness  PT Problem List: Decreased strength;Decreased range of motion;Decreased activity tolerance;Decreased balance;Decreased mobility;Decreased knowledge of use of DME;Decreased safety awareness;Pain;Obesity PT Treatment Interventions: Gait training;Stair training;Functional mobility training;Therapeutic activities;Therapeutic exercise;DME instruction;Balance training;Patient/family education   PT Goals Acute Rehab PT Goals PT Goal Formulation: With patient Time For Goal Achievement: 12/19/12 Potential to Achieve Goals: Good Pt will go Supine/Side to Sit: with supervision;with HOB 0 degrees PT Goal: Supine/Side to Sit - Progress: Goal set today Pt will go Sit to Supine/Side: with supervision;with HOB 0 degrees PT Goal: Sit to Supine/Side - Progress: Goal set today Pt will go Sit to Stand: with supervision PT Goal: Sit to Stand - Progress: Goal set today Pt will go Stand to Sit: with supervision PT Goal: Stand to Sit - Progress: Goal set today Pt will Transfer Bed to Chair/Chair to Bed: with supervision PT Transfer Goal: Bed to Chair/Chair to Bed - Progress: Goal set today Pt will Ambulate: 51 - 150 feet;with supervision;with rolling walker PT Goal: Ambulate - Progress: Goal set today Pt will Go Up / Down Stairs: 3-5 stairs;with min assist;with least restrictive assistive device PT Goal: Up/Down Stairs - Progress: Goal set today Pt will Perform Home Exercise Program: with supervision, verbal cues required/provided PT Goal: Perform Home Exercise Program - Progress: Goal set today  Visit Information  Last PT Received On: 12/05/12 Assistance Needed: +2    Subjective Data  Subjective: Agree to PT eval.    Prior Functioning  Home Living Lives With: Spouse;Daughter Available Help at Discharge: Family;Available 24 hours/day Type of Home: House Home Access:  Ramped entrance;Stairs to enter Entrance Stairs-Number of Steps: 3 Entrance Stairs-Rails:  Left Home Layout: One level Bathroom Shower/Tub: Walk-in shower;Door [employer] Toilet: Standard Bathroom Accessibility: Yes How Accessible: Accessible via walker Home Adaptive Equipment: Raised toilet seat with rails;Walker - rolling Prior Function Level of Independence: Independent Able to Take Stairs?: Yes Driving: Yes Vocation: 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    Copywriter, advertising Overall Cognitive Status: Appears within functional limits for tasks assessed/performed Arousal/Alertness: Awake/alert Orientation Level: Appears intact for tasks assessed Behavior During Session: [facility] for tasks performed    Extremity/Trunk Assessment Right Upper Extremity Assessment RUE ROM/Strength/Tone: Within functional levels Left Upper Extremity Assessment LUE ROM/Strength/Tone: Within functional levels Right Lower Extremity Assessment RLE ROM/Strength/Tone: Unable to fully assess;Due to pain Left Lower Extremity Assessment LLE ROM/Strength/Tone: Within functional levels   Balance Balance Balance Assessed: Yes Static Sitting Balance Static Sitting - Balance Support: Bilateral upper extremity supported;Feet supported Static Sitting - Level of Assistance: 4: Min assist;3: Mod assist Static Sitting - Comment/# of Minutes: 2 minutes sitting on EOB. Required assist to prevent posterior lean secondary to c/o dizziness.  Improved after sitting 2 minutes.    End of Session PT - End of Session Equipment Utilized During Treatment: Gait belt;Right knee immobilizer Activity Tolerance: Patient limited by fatigue;Patient limited by pain Patient left: in chair;with call bell/phone within reach Nurse Communication: Mobility status  GP     Hawk Mones 12/05/2012, 12:16 PM Talal Fritchman L. Durell Lofaso DPT [phone]

## 2012-12-05 NOTE — Progress Notes (Signed)
PT Cancellation Note  Patient Details Name: [name] MRN: [id number] DOB: 1941-04-29   Cancelled Treatment:    Reason Eval/Treat Not Completed: Medical issues which prohibited therapy (pt vomitting.  )   Alferd Apa 12/05/2012, 6:35 PM

## 2012-12-06 LAB — CBC
HCT: 23.8 % — ABNORMAL LOW (ref 36.0–46.0)
Hemoglobin: 8.2 g/dL — ABNORMAL LOW (ref 12.0–15.0)
MCH: 28.5 pg (ref 26.0–34.0)
MCHC: 34.5 g/dL (ref 30.0–36.0)
MCV: 82.6 fL (ref 78.0–100.0)
Platelets: 284 10*3/uL (ref 150–400)
RBC: 2.88 MIL/uL — ABNORMAL LOW (ref 3.87–5.11)
RDW: 13.5 % (ref 11.5–15.5)
WBC: 13.9 10*3/uL — ABNORMAL HIGH (ref 4.0–10.5)

## 2012-12-06 MED ORDER — ASPIRIN EC 81 MG PO TBEC: 81.0000 mg | DELAYED_RELEASE_TABLET | Freq: Every day | ORAL | Status: DC

## 2012-12-06 MED ORDER — HYDROCODONE-ACETAMINOPHEN 7.5-325 MG PO TABS
1.0000 | ORAL_TABLET | ORAL | Status: DC | PRN
Start: 2012-12-06 — End: 2013-07-16

## 2012-12-06 MED ORDER — OXYCODONE-ACETAMINOPHEN 5-325 MG PO TABS
1.0000 | ORAL_TABLET | ORAL | Status: DC | PRN
Start: 2012-12-06 — End: 2013-07-16

## 2012-12-06 NOTE — Discharge Summary (Signed)
Physician Discharge Summary  Patient ID: [name] MRN: [id number] DOB/AGE: [birth year] 72 y.o.  Admit date: 12/04/2012 Discharge date: 12/06/2012  Admission Diagnoses: Osteoarthritis right knee  Discharge Diagnoses: Osteoarthritis right knee. Avulsion medial collateral ligament off the medial femoral condyle Active Problems:   * No active hospital problems. *   Discharged Condition: stable  Hospital Course: Patient's hospital course was essentially unremarkable she underwent total knee arthroplasty. Do to osteoporosis of the bone the medial collateral ligament avulsed off the medial femoral condyle. This was repaired. Patient progressed with physical therapy and was able to ambulate independently and was discharged to home in stable condition.  Consults: None  Significant Diagnostic Studies: labs: Routine labs  Treatments: surgery: See operative note  Discharge Exam: Blood pressure 97/62, pulse 83, temperature 98.8 F (37.1 C), temperature source Oral, resp. rate 18, SpO2 100.00%. Incision/Wound: incision clean and dry  Disposition:   Discharge Orders   Future Orders Complete By Expires     Call MD / Call 911  As directed     Comments:      If you experience chest pain or shortness of breath, CALL 911 and be transported to the hospital emergency room.  If you develope a fever above 101 F, pus (white drainage) or increased drainage or redness at the wound, or calf pain, call your surgeon's office.    Constipation Prevention  As directed     Comments:      Drink plenty of fluids.  Prune juice may be helpful.  You may use a stool softener, such as Colace (over the counter) 100 mg twice a day.  Use MiraLax (over the counter) for constipation as needed.    Diet - low sodium heart healthy  As directed     Increase activity slowly as tolerated  As directed     Weight bearing as tolerated  As directed         Medication List    TAKE these medications       albuterol 108  (90 BASE) MCG/ACT inhaler  Commonly known as:  PROVENTIL HFA;VENTOLIN HFA  Inhale 2 puffs into the lungs every 6 (six) hours as needed for wheezing.     aspirin EC 81 MG tablet  Take 1 tablet (81 mg total) by mouth daily.     hydrochlorothiazide 12.5 MG capsule  Commonly known as:  MICROZIDE  Take 12.5 mg by mouth daily.     HYDROcodone-acetaminophen 5-325 MG per tablet  Commonly known as:  NORCO/VICODIN  Take 1 tablet by mouth every 6 (six) hours as needed for pain.     HYDROcodone-acetaminophen 7.5-325 MG per tablet  Commonly known as:  NORCO  Take 1 tablet by mouth every 4 (four) hours as needed for pain.     losartan 100 MG tablet  Commonly known as:  COZAAR  Take 100 mg by mouth daily.     oxyCODONE-acetaminophen 5-325 MG per tablet  Commonly known as:  ROXICET  Take 1 tablet by mouth every 4 (four) hours as needed for pain.     simvastatin 80 MG tablet  Commonly known as:  ZOCOR  Take 80 mg by mouth at bedtime.           Follow-up Information   Follow up with Bonnie Roig V, MD In 2 weeks.   Contact information:   [address] [place] [address] [phone]       Signed: Nadara Mustard 12/06/2012, 6:44 AM

## 2012-12-06 NOTE — Progress Notes (Signed)
Patient ID: [name], female   DOB: 1941/07/07, 72 y.o.   MRN: [id number] Plan for discharge to home Saturday. Patient to continue using her knee immobilizer for ambulation do to avulsion of the MCL off the medial femoral condyle. Prescriptions on the chart for discharge.

## 2012-12-06 NOTE — Progress Notes (Signed)
Physical Therapy Treatment Patient Details Name: GENEVEIVE FURNESS MRN: [id number] DOB: 02/23/1941 Today's Date: 12/06/2012 Time: 4098-1191 PT Time Calculation (min): 17 min  PT Assessment / Plan / Recommendation Comments on Treatment Session  Patient able to ambulate again a short distance. MD wrote discharge for tomorrow but unsure if patient will be ready    Follow Up Recommendations  Home health PT;Supervision/Assistance - 24 hour     Does the patient have the potential to tolerate intense rehabilitation     Barriers to Discharge        Equipment Recommendations  None recommended by PT    Recommendations for Other Services    Frequency 7X/week   Plan Discharge plan remains appropriate;Frequency remains appropriate    Precautions / Restrictions Precautions Precautions: Fall Restrictions Weight Bearing Restrictions: Yes RLE Weight Bearing: Weight bearing as tolerated   Pertinent Vitals/Pain     Mobility  Bed Mobility Supine to Sit: 4: Min assist Sitting - Scoot to Edge of Bed: 4: Min assist Sit to Supine: 4: Min assist Details for Bed Mobility Assistance: Assist to support RLE and trunk out. Cues for sequency Transfers Sit to Stand: 4: Min assist;From bed;With upper extremity assist;4: Min guard;With armrests Stand to Sit: To chair/3-in-1;4: Min assist;With upper extremity assist;4: Min guard Details for Transfer Assistance: MinGuard with the use of armrest Ambulation/Gait Ambulation/Gait Assistance: 4: Min assist Ambulation Distance (Feet): 15 Feet Assistive device: Rolling walker Ambulation/Gait Assistance Details: Cues for posture and to increase step. Heavy relkiance on RW. Patient becoming SOB,.  Gait Pattern: Step-to pattern;Trunk flexed Gait velocity: decreased    Exercises Total Joint Exercises Ankle Circles/Pumps: Both;10 reps Quad Sets: AROM;Right;10 reps Heel Slides: AAROM;Right;10 reps Hip ABduction/ADduction: AAROM;Right;10 reps Straight Leg  Raises: AAROM;Right;10 reps   PT Diagnosis:    PT Problem List:   PT Treatment Interventions:     PT Goals Acute Rehab PT Goals PT Goal: Supine/Side to Sit - Progress: Progressing toward goal PT Goal: Sit to Stand - Progress: Progressing toward goal PT Goal: Stand to Sit - Progress: Progressing toward goal PT Transfer Goal: Bed to Chair/Chair to Bed - Progress: Progressing toward goal PT Goal: Ambulate - Progress: Progressing toward goal PT Goal: Perform Home Exercise Program - Progress: Progressing toward goal  Visit Information  Last PT Received On: 12/06/12 Assistance Needed: +1    Subjective Data      Cognition  Cognition Overall Cognitive Status: Appears within functional limits for tasks assessed/performed Arousal/Alertness: Awake/alert Orientation Level: Appears intact for tasks assessed Behavior During Session: [facility] for tasks performed    Balance     End of Session PT - End of Session Equipment Utilized During Treatment: Gait belt;Right knee immobilizer Activity Tolerance: Patient limited by fatigue Patient left: in chair;with call bell/phone within reach   GP     Fredrich Birks 12/06/2012, 2:51 PM  12/06/2012 Fredrich Birks PTA [phone] pager [phone] office

## 2012-12-06 NOTE — Progress Notes (Signed)
Occupational Therapy Treatment Patient Details Name: [name] MRN: [id number] DOB: 09-13-1941 Today's Date: 12/06/2012 Time: 9629-5284 OT Time Calculation (min): 39 min  OT Assessment / Plan / Recommendation Comments on Treatment Session Pt. stated she understands use of  AE and does not need further traiing. Pt. ed. on transfer to 3-1 commode and stated she felf comfortable performing. No further OT indecated at this time.     Follow Up Recommendations       Barriers to Discharge       Equipment Recommendations       Recommendations for Other Services    Frequency     Plan Discharge plan remains appropriate    Precautions / Restrictions Precautions Precautions: Fall Restrictions Weight Bearing Restrictions: Yes RLE Weight Bearing: Weight bearing as tolerated   Pertinent Vitals/Pain     ADL  Lower Body Dressing: Performed;Minimal assistance Where Assessed - Lower Body Dressing: Unsupported sitting Toilet Transfer: Performed;Minimal assistance Toilet Transfer Method: Stand pivot Toilet Transfer Equipment: Bedside commode Toileting - Clothing Manipulation and Hygiene: Performed;Minimal assistance Where Assessed - Glass blower/designer Manipulation and Hygiene: Standing ADL Comments: Pt. ed. on use of AE for LE ADL's and was able to return demo. Pt. performed transfer to commode at Min A level with min. cues for proper hand placement and to kick out LE prior to sitting/standing. Pt. demo'ed understanding.     OT Diagnosis:    OT Problem List:   OT Treatment Interventions:     OT Goals ADL Goals ADL Goal: Lower Body Dressing - Progress: Met ADL Goal: Toilet Transfer - Progress: Progressing toward goals ADL Goal: Toileting - Clothing Manipulation - Progress: Met ADL Goal: Toileting - Hygiene - Progress: Met  Visit Information  Last OT Received On: 12/06/12 Assistance Needed: +1    Subjective Data      Prior Functioning       Cognition  Cognition Overall  Cognitive Status: Appears within functional limits for tasks assessed/performed Arousal/Alertness: Awake/alert Orientation Level: Appears intact for tasks assessed Behavior During Session: [name] for tasks performed    Mobility  Bed Mobility Supine to Sit: 4: Min assist Sitting - Scoot to Edge of Bed: 4: Min assist Sit to Supine: 4: Min assist Details for Bed Mobility Assistance: Assist to support RLE and trunk out. Cues for sequency Transfers Sit to Stand: 4: Min assist;From bed;With upper extremity assist Stand to Sit: To chair/3-in-1;4: Min assist;With upper extremity assist Details for Transfer Assistance: Manual facilitaiton to shift wt forward and steady pt.  VCs for hand placement and safe technique.     Exercises  Total Joint Exercises Ankle Circles/Pumps: Both;10 reps Quad Sets: AROM;Right;10 reps Heel Slides: AAROM;Right;10 reps Hip ABduction/ADduction: AAROM;Right;10 reps Straight Leg Raises: AAROM;Right;10 reps   Balance     End of Session OT - End of Session Equipment Utilized During Treatment: Right knee immobilizer Activity Tolerance: Patient tolerated treatment well Patient left: in bed;with call bell/phone within reach;with family/visitor present  GO     [name] 12/06/2012, 12:39 PM

## 2012-12-06 NOTE — Progress Notes (Signed)
Physical Therapy Treatment Patient Details Name: [name] MRN: [id number] DOB: August 13, 1941 Today's Date: 12/06/2012 Time: 4098-1191 PT Time Calculation (min): 25 min  PT Assessment / Plan / Recommendation Comments on Treatment Session  Patient able to progress and attempt short ambulation this morning. Conitinue to progress this afternoon    Follow Up Recommendations  Home health PT;Supervision/Assistance - 24 hour     Does the patient have the potential to tolerate intense rehabilitation     Barriers to Discharge        Equipment Recommendations  None recommended by PT    Recommendations for Other Services    Frequency 7X/week   Plan Discharge plan remains appropriate;Frequency remains appropriate    Precautions / Restrictions Precautions Precautions: Fall Restrictions Weight Bearing Restrictions: Yes RLE Weight Bearing: Weight bearing as tolerated   Pertinent Vitals/Pain     Mobility  Bed Mobility Supine to Sit: 4: Min assist Sitting - Scoot to Edge of Bed: 4: Min assist Details for Bed Mobility Assistance: Assist to support RLE and trunk out. Cues for sequency Transfers Sit to Stand: 4: Min assist;From bed;With upper extremity assist Stand to Sit: To chair/3-in-1;4: Min assist;With upper extremity assist Details for Transfer Assistance: Manual facilitaiton to shift wt forward and steady pt.  VCs for hand placement and safe technique.  Ambulation/Gait Ambulation/Gait Assistance: 4: Min assist Ambulation Distance (Feet): 16 Feet Assistive device: Rolling walker Ambulation/Gait Assistance Details: Cues for posture, WB status, sequency and RW Gait Pattern: Step-to pattern;Trunk flexed Gait velocity: decreased    Exercises Total Joint Exercises Ankle Circles/Pumps: Both;10 reps Quad Sets: AROM;Right;10 reps Heel Slides: AAROM;Right;10 reps Hip ABduction/ADduction: AAROM;Right;10 reps Straight Leg Raises: AAROM;Right;10 reps   PT Diagnosis:    PT Problem  List:   PT Treatment Interventions:     PT Goals Acute Rehab PT Goals PT Goal: Supine/Side to Sit - Progress: Progressing toward goal PT Goal: Sit to Stand - Progress: Progressing toward goal PT Goal: Stand to Sit - Progress: Progressing toward goal PT Transfer Goal: Bed to Chair/Chair to Bed - Progress: Progressing toward goal PT Goal: Ambulate - Progress: Progressing toward goal PT Goal: Perform Home Exercise Program - Progress: Progressing toward goal  Visit Information  Last PT Received On: 12/06/12 Assistance Needed: +1    Subjective Data      Cognition  Cognition Overall Cognitive Status: Appears within functional limits for tasks assessed/performed Arousal/Alertness: Awake/alert Orientation Level: Appears intact for tasks assessed Behavior During Session: [facility] for tasks performed    Balance     End of Session PT - End of Session Equipment Utilized During Treatment: Gait belt;Right knee immobilizer Activity Tolerance: Patient limited by fatigue;Patient tolerated treatment well Patient left: in chair;with call bell/phone within reach   GP     Fredrich Birks 12/06/2012, 12:10 PM 12/06/2012 Fredrich Birks PTA [phone] pager [phone] office

## 2012-12-06 NOTE — Care Management Note (Signed)
CARE MANAGEMENT NOTE 12/06/2012  Patient:  [name], [name]   Account Number:  [id number]  Date Initiated:  12/06/2012  Documentation initiated by:  Vance Peper  Subjective/Objective Assessment:   72 yr old female s/p right total knee arthroplasty     Action/Plan:   CM spoke with patient and husband concerning home health and DME needs. Choice offered. Patient has rolling walker and 3in1. CPM not being used.   Anticipated DC Date:  12/06/2012   Anticipated DC Plan:  HOME W HOME HEALTH SERVICES      DC Planning Services  CM consult      [facility] Choice  HOME HEALTH   Choice offered to / List presented to:  C-1 Patient        HH arranged  HH-2 PT      [facility] agency  [facility]   Status of service:  Completed, signed off Medicare Important Message given?   (If response is "NO", the following Medicare IM given date fields will be blank) Date Medicare IM given:   Date Additional Medicare IM given:    Discharge Disposition:  HOME W HOME HEALTH SERVICES  Per UR Regulation:    If discussed at Long Length of Stay Meetings, dates discussed:    Comments:

## 2012-12-07 LAB — CBC
HCT: 24.3 % — ABNORMAL LOW (ref 36.0–46.0)
Hemoglobin: 8.3 g/dL — ABNORMAL LOW (ref 12.0–15.0)
MCH: 29 pg (ref 26.0–34.0)
MCHC: 34.2 g/dL (ref 30.0–36.0)
MCV: 85 fL (ref 78.0–100.0)
Platelets: 304 10*3/uL (ref 150–400)
RBC: 2.86 MIL/uL — ABNORMAL LOW (ref 3.87–5.11)
RDW: 13.5 % (ref 11.5–15.5)
WBC: 12.9 10*3/uL — ABNORMAL HIGH (ref 4.0–10.5)

## 2012-12-07 MED ORDER — ASPIRIN 325 MG PO TBEC: 325.0000 mg | DELAYED_RELEASE_TABLET | Freq: Every day | ORAL | Status: DC

## 2012-12-07 NOTE — Progress Notes (Signed)
Physical Therapy Treatment Patient Details Name: [name] MRN: [id number] DOB: 1941-01-02 Today's Date: 12/07/2012 Time: 4098-1191 PT Time Calculation (min): 25 min  PT Assessment / Plan / Recommendation Comments on Treatment Session  Pt and husband feel ready for pt to DC home.  Explained mobility and safety at home to pt and husband.  Answered all questions related to mobility.      Follow Up Recommendations  Home health PT;Supervision/Assistance - 24 hour     Does the patient have the potential to tolerate intense rehabilitation     Barriers to Discharge        Equipment Recommendations  None recommended by PT    Recommendations for Other Services    Frequency 7X/week   Plan Discharge plan remains appropriate;Frequency remains appropriate    Precautions / Restrictions Precautions Precautions: Fall Restrictions Weight Bearing Restrictions: Yes RLE Weight Bearing: Weight bearing as tolerated   Pertinent Vitals/Pain Pt reports no pain in knee at rest    Mobility  Bed Mobility Bed Mobility: Supine to Sit Supine to Sit: 4: Min assist;With rails;HOB flat Details for Bed Mobility Assistance:  (assist for RLE) Transfers Transfers: Sit to Stand;Stand to Sit Sit to Stand: 4: Min assist;With upper extremity assist;From bed;From chair/3-in-1 Stand to Sit: 4: Min assist;With upper extremity assist;To chair/3-in-1 Details for Transfer Assistance: VC's for sequencing.  Greatest difficulty was getting up from EOB.  Pt states she will likely sleep in recliner for first few days upon DC Ambulation/Gait Ambulation/Gait Assistance: 4: Min guard Ambulation Distance (Feet): 15 Feet (also ambulated 10 feet) Assistive device: Rolling walker Gait Pattern: Step-to pattern;Decreased stride length Gait velocity: decreased General Gait Details: occassional cues for sequencing Stairs: No (pt to use ramp at home)    Exercises     PT Diagnosis:    PT Problem List:   PT Treatment  Interventions:     PT Goals Acute Rehab PT Goals Pt will go Supine/Side to Sit: with supervision;with HOB 0 degrees PT Goal: Supine/Side to Sit - Progress: Progressing toward goal Pt will go Sit to Supine/Side: with supervision;with HOB 0 degrees PT Goal: Sit to Supine/Side - Progress: Progressing toward goal Pt will go Sit to Stand: with supervision PT Goal: Sit to Stand - Progress: Progressing toward goal Pt will go Stand to Sit: with supervision PT Goal: Stand to Sit - Progress: Progressing toward goal Pt will Transfer Bed to Chair/Chair to Bed: with supervision PT Transfer Goal: Bed to Chair/Chair to Bed - Progress: Progressing toward goal Pt will Ambulate: 51 - 150 feet;with supervision;with rolling walker PT Goal: Ambulate - Progress: Progressing toward goal Pt will Go Up / Down Stairs: Other (comment) (NA secondary to pt to use ramp) PT Goal: Up/Down Stairs - Progress: Discontinued (comment) Pt will Perform Home Exercise Program: with supervision, verbal cues required/provided PT Goal: Perform Home Exercise Program - Progress: Progressing toward goal  Visit Information  Last PT Received On: 12/07/12 Assistance Needed: +1    Subjective Data  Subjective: pt feels ready for DC   Cognition  Cognition Overall Cognitive Status: Appears within functional limits for tasks assessed/performed Arousal/Alertness: Awake/alert Orientation Level: Appears intact for tasks assessed Behavior During Session: [facility] for tasks performed    Balance     End of Session PT - End of Session Equipment Utilized During Treatment: Gait belt;Right knee immobilizer Activity Tolerance: Patient tolerated treatment well (limited session secondary to pt preparing for DC home ) Patient left: in bed;with call bell/phone within reach;with family/visitor present  GP     Donnella Sham 12/07/2012, 1:39 PM Lavona [place]ound, PT  [phone] 12/07/2012

## 2012-12-07 NOTE — Progress Notes (Signed)
Patient ID: [name], female   DOB: 1941/07/28, 72 y.o.   MRN: [id number] Subjective: 3 Days Post-Op Procedure(s) (LRB): TOTAL KNEE ARTHROPLASTY (Right) Awake, alert and Ox4. I didn't take any pain medication last night. Patient reports pain as mild.    Objective:   VITALS:  Temp:  [98.3 F (36.8 C)-98.5 F (36.9 C)] 98.3 F (36.8 C) (03/22 0610) Pulse Rate:  [72-83] 72 (03/22 0610) Resp:  [16-17] 16 (03/22 0610) BP: (95-115)/(46-60) 115/60 mmHg (03/22 0610) SpO2:  [97 %-99 %] 97 % (03/22 0610)  Neurologically intact ABD soft Neurovascular intact Sensation intact distally Intact pulses distally Dorsiflexion/Plantar flexion intact Incision: dressing C/D/I and no drainage No cellulitis present Compartment soft   LABS  Recent Labs  12/05/12 0550 12/06/12 0603 12/07/12 0515  HGB 9.6* 8.2* 8.3*  WBC 11.1* 13.9* 12.9*  PLT 307 284 304    Recent Labs  12/05/12 0550  NA 135  K 4.0  CL 98  CO2 29  BUN 18  CREATININE 1.04  GLUCOSE 156*   No results found for this basename: LABPT, INR,  in the last 72 hours   Assessment/Plan: 3 Days Post-Op Procedure(s) (LRB): TOTAL KNEE ARTHROPLASTY (Right)  Up with therapy Discharge home with home health  NITKA,JAMES E 12/07/2012, 10:26 AM

## 2012-12-07 NOTE — Discharge Summary (Signed)
Physician Discharge Summary  Patient ID: [name] MRN: [id number] DOB/AGE: [birth year] 72 y.o.  Admit date: 12/04/2012 Discharge date: 12/07/2012  Admission Diagnoses:  Active Problems:   * No active hospital problems. *   Discharge Diagnoses:  Same  Past Medical History  Diagnosis Date  . Asthma   . Arthritis   . Hypertension     pcp   [employer]     . GERD (gastroesophageal reflux disease)     Surgeries: Procedure(s): TOTAL KNEE ARTHROPLASTY on 12/04/2012   Consultants:    Discharged Condition: Improved  Hospital Course: [name] is an 72 y.o. female who was admitted 12/04/2012 with a chief complaint of No chief complaint on file. , and found to have a diagnosis of <principal problem not specified>.  They were brought to the operating room on 12/04/2012 and underwent the above named procedures.    They were given perioperative antibiotics:  Anti-infectives   Start     Dose/Rate Route Frequency Ordered Stop   12/04/12 1400  ceFAZolin (ANCEF) IVPB 1 g/50 mL premix     1 g 100 mL/hr over 30 Minutes Intravenous Every 6 hours 12/04/12 1357 12/04/12 2106   12/04/12 0600  ceFAZolin (ANCEF) IVPB 2 g/50 mL premix     2 g 100 mL/hr over 30 Minutes Intravenous On call to O.R. 12/03/12 1456 12/04/12 1025    .  They were given sequential compression devices, early ambulation, and chemoprophylaxis for DVT prophylaxis. She participated in PT and OT during POD #1 through POD#3. On POD#3 she was awake alert and Ox4. Dressing Changed showed minimal knee swelling no drainage or sign of infection. She tolerated po narcotic medication with Good relief of discomfort and was discharge to home with home health therapy.  They benefited maximally from their hospital stay and there were no complications.    Recent vital signs:  Filed Vitals:   12/07/12 0610  BP: 115/60  Pulse: 72  Temp: 98.3 F (36.8 C)  Resp: 16    Recent laboratory studies:  Results for orders  placed during the hospital encounter of 12/04/12  CBC      Result Value Range   WBC 11.1 (*) 4.0 - 10.5 K/uL   RBC 3.42 (*) 3.87 - 5.11 MIL/uL   Hemoglobin 9.6 (*) 12.0 - 15.0 g/dL   HCT 62.1 (*) 30.8 - 65.7 %   MCV 84.8  78.0 - 100.0 fL   MCH 28.1  26.0 - 34.0 pg   MCHC 33.1  30.0 - 36.0 g/dL   RDW 84.6  96.2 - 95.2 %   Platelets 307  150 - 400 K/uL  BASIC METABOLIC PANEL      Result Value Range   Sodium 135  135 - 145 mEq/L   Potassium 4.0  3.5 - 5.1 mEq/L   Chloride 98  96 - 112 mEq/L   CO2 29  19 - 32 mEq/L   Glucose, Bld 156 (*) 70 - 99 mg/dL   BUN 18  6 - 23 mg/dL   Creatinine, Ser 8.41  0.50 - 1.10 mg/dL   Calcium 8.8  8.4 - 32.4 mg/dL   GFR calc non Af Amer 52 (*) >90 mL/min   GFR calc Af Amer 60 (*) >90 mL/min  CBC      Result Value Range   WBC 13.9 (*) 4.0 - 10.5 K/uL   RBC 2.88 (*) 3.87 - 5.11 MIL/uL   Hemoglobin 8.2 (*) 12.0 - 15.0 g/dL  HCT 23.8 (*) 36.0 - 46.0 %   MCV 82.6  78.0 - 100.0 fL   MCH 28.5  26.0 - 34.0 pg   MCHC 34.5  30.0 - 36.0 g/dL   RDW 86.5  78.4 - 69.6 %   Platelets 284  150 - 400 K/uL  CBC      Result Value Range   WBC 12.9 (*) 4.0 - 10.5 K/uL   RBC 2.86 (*) 3.87 - 5.11 MIL/uL   Hemoglobin 8.3 (*) 12.0 - 15.0 g/dL   HCT 29.5 (*) 28.4 - 13.2 %   MCV 85.0  78.0 - 100.0 fL   MCH 29.0  26.0 - 34.0 pg   MCHC 34.2  30.0 - 36.0 g/dL   RDW 44.0  10.2 - 72.5 %   Platelets 304  150 - 400 K/uL    Discharge Medications:     Medication List    TAKE these medications       albuterol 108 (90 BASE) MCG/ACT inhaler  Commonly known as:  PROVENTIL HFA;VENTOLIN HFA  Inhale 2 puffs into the lungs every 6 (six) hours as needed for wheezing.     aspirin EC 81 MG tablet  Take 1 tablet (81 mg total) by mouth daily.     aspirin 325 MG EC tablet  Take 1 tablet (325 mg total) by mouth daily with breakfast.     hydrochlorothiazide 12.5 MG capsule  Commonly known as:  MICROZIDE  Take 12.5 mg by mouth daily.     HYDROcodone-acetaminophen 5-325 MG  per tablet  Commonly known as:  NORCO/VICODIN  Take 1 tablet by mouth every 6 (six) hours as needed for pain.     HYDROcodone-acetaminophen 7.5-325 MG per tablet  Commonly known as:  NORCO  Take 1 tablet by mouth every 4 (four) hours as needed for pain.     losartan 100 MG tablet  Commonly known as:  COZAAR  Take 100 mg by mouth daily.     oxyCODONE-acetaminophen 5-325 MG per tablet  Commonly known as:  ROXICET  Take 1 tablet by mouth every 4 (four) hours as needed for pain.     simvastatin 80 MG tablet  Commonly known as:  ZOCOR  Take 80 mg by mouth at bedtime.        Diagnostic Studies: Dg Chest 2 View  11/27/2012  *RADIOLOGY REPORT*  Clinical Data: Preop radiograph  CHEST - 2 VIEW  Comparison: 10/20/2008  Findings: Normal heart size.  There is asymmetric elevation of the right hemidiaphragm.  No airspace consolidation noted.   Review of the visualized osseous structures is significant for mild spondylosis within the thoracic spine.  IMPRESSION:  1.  No acute cardiopulmonary abnormalities   Original Report Authenticated By: Signa Kell, M.D.    Dg Knee Right Port  12/04/2012  *RADIOLOGY REPORT*  Clinical Data: Knee pain  PORTABLE RIGHT KNEE - 1-2 VIEW  Comparison: 01/12/2011.  Findings: Satisfactory appearance status post right TKR.  No adverse features.  IMPRESSION: As above.   Original Report Authenticated By: Davonna Belling, M.D.     Disposition:       Discharge Orders   Future Orders Complete By Expires     Call MD / Call 911  As directed     Comments:      If you experience chest pain or shortness of breath, CALL 911 and be transported to the hospital emergency room.  If you develope a fever above 101 F, pus (white drainage) or increased drainage  or redness at the wound, or calf pain, call your surgeon's office.    Call MD / Call 911  As directed     Comments:      If you experience chest pain or shortness of breath, CALL 911 and be transported to the hospital emergency  room.  If you develope a fever above 101 F, pus (white drainage) or increased drainage or redness at the wound, or calf pain, call your surgeon's office.    Change dressing  As directed     Comments:      Change dressing on 3/23, then change the dressing daily with sterile 4 x 4 inch gauze dressing and apply TED hose.  You may clean the incision with alcohol prior to redressing.    Constipation Prevention  As directed     Comments:      Drink plenty of fluids.  Prune juice may be helpful.  You may use a stool softener, such as Colace (over the counter) 100 mg twice a day.  Use MiraLax (over the counter) for constipation as needed.    Constipation Prevention  As directed     Comments:      Drink plenty of fluids.  Prune juice may be helpful.  You may use a stool softener, such as Colace (over the counter) 100 mg twice a day.  Use MiraLax (over the counter) for constipation as needed.    Diet - low sodium heart healthy  As directed     Discharge instructions  As directed     Comments:      Keep knee incision dry for 5 days post op then may wet while bathing. Therapy daily and CPM goal full extension and greater than 90 degrees flexion. Call if fever or chills or increased drainage. Go to ER if acutely short of breath or call for ambulance. Return for follow up in 2 weeks. May full weight bear on the surgical leg unless told otherwise. Use knee immobilizer until able to straight leg raise off bed with knee stable. In house walking for first 2 weeks.    Do not put a pillow under the knee. Place it under the heel.  As directed     Driving restrictions  As directed     Comments:      No driving for 6 weeks    Increase activity slowly as tolerated  As directed     Increase activity slowly as tolerated  As directed     TED hose  As directed     Comments:      Use stockings (TED hose) for 2 weeks on right  leg(s).  You may remove them at night for sleeping.    Weight bearing as tolerated  As  directed        Follow-up Information   Follow up with DUDA,MARCUS V, MD In 2 weeks.   Contact information:   [address] [place] [address] [phone]        Signed: Kerrin Mullen 12/07/2012, 10:32 AM

## 2012-12-07 NOTE — Progress Notes (Signed)
Physical Therapy Treatment Patient Details Name: [name] MRN: [id number] DOB: Dec 23, 1940 Today's Date: 12/07/2012 Time: 9147-8295 PT Time Calculation (min): 27 min  PT Assessment / Plan / Recommendation Comments on Treatment Session  Pt able to progress with ambulation although appears effortful; Requires max cues for sequencing & safe advancement of RW.       Follow Up Recommendations  Home health PT;Supervision/Assistance - 24 hour     Does the patient have the potential to tolerate intense rehabilitation     Barriers to Discharge        Equipment Recommendations  None recommended by PT    Recommendations for Other Services    Frequency 7X/week   Plan Discharge plan remains appropriate;Frequency remains appropriate    Precautions / Restrictions Precautions Precautions: Fall Restrictions RLE Weight Bearing: Weight bearing as tolerated   Pertinent Vitals/Pain Denies pain upon arrival.  5/10 at end of session.  Pt states she had vicodin ~2 hrs ago.      Mobility  Bed Mobility Bed Mobility: Supine to Sit;Sitting - Scoot to Edge of Bed Supine to Sit: 4: Min assist;HOB flat Sitting - Scoot to [place] of Bed: 4: Min assist Details for Bed Mobility Assistance: (A) for RLE.  Pt able to sit upright without physical (A) but required increased time & appeared effortful.   Transfers Transfers: Sit to Stand;Stand to Sit Sit to Stand: 4: Min assist;With upper extremity assist;With armrests;From bed;From chair/3-in-1;4: Min guard Stand to Sit: 4: Min assist;With upper extremity assist;With armrests;To chair/3-in-1 Details for Transfer Assistance: Min (A) to achieve standing from bed & Min Guard to stand from recliner with armrests.   Cues for hand placement & R LE positioning.  (A) to control descent.   Ambulation/Gait Ambulation/Gait Assistance: 4: Min guard Ambulation Distance (Feet): 40 Feet Assistive device: Rolling walker Ambulation/Gait Assistance Details: Cues for  sequencing, tall posture, body positioning inside RW, & encouragement for increased WBing through R LE.  Cont's to rely heavily on RW.  Required 1 seated rest break.  Pt reports "stiffness" limiting her.   Gait Pattern: Step-to pattern;Decreased step length - left;Decreased stance time - right;Decreased weight shift to right Gait velocity: decreased Stairs: No (pt states she is going to use ramp) Wheelchair Mobility Wheelchair Mobility: No      PT Goals Acute Rehab PT Goals Time For Goal Achievement: 12/19/12 Potential to Achieve Goals: Good Pt will go Supine/Side to Sit: with supervision;with HOB 0 degrees PT Goal: Supine/Side to Sit - Progress: Not met Pt will go Sit to Supine/Side: with supervision;with HOB 0 degrees Pt will go Sit to Stand: with supervision PT Goal: Sit to Stand - Progress: Progressing toward goal Pt will go Stand to Sit: with supervision PT Goal: Stand to Sit - Progress: Not met Pt will Transfer Bed to Chair/Chair to Bed: with supervision Pt will Ambulate: 51 - 150 feet;with supervision;with rolling walker PT Goal: Ambulate - Progress: Progressing toward goal Pt will Go Up / Down Stairs: 3-5 stairs;with min assist;with least restrictive assistive device Pt will Perform Home Exercise Program: with supervision, verbal cues required/provided  Visit Information  Last PT Received On: 12/07/12 Assistance Needed: +1    Subjective Data      Cognition  Cognition Overall Cognitive Status: Appears within functional limits for tasks assessed/performed Arousal/Alertness: Awake/alert Orientation Level: Appears intact for tasks assessed Behavior During Session: [facility] for tasks performed    Balance     End of Session PT - End of Session Equipment Utilized  During Treatment: Gait belt;Right knee immobilizer Activity Tolerance: Patient tolerated treatment well;Patient limited by fatigue Patient left: in chair;with call bell/phone within reach Nurse Communication:  Mobility status     Verdell Face, [place] [phone] 12/07/2012

## 2013-01-07 ENCOUNTER — Ambulatory Visit: Payer: Medicare PPO | Attending: Orthopedic Surgery | Admitting: Physical Therapy

## 2013-01-07 DIAGNOSIS — M25669 Stiffness of unspecified knee, not elsewhere classified: Secondary | ICD-10-CM | POA: Insufficient documentation

## 2013-01-07 DIAGNOSIS — R269 Unspecified abnormalities of gait and mobility: Secondary | ICD-10-CM | POA: Insufficient documentation

## 2013-01-07 DIAGNOSIS — M6281 Muscle weakness (generalized): Secondary | ICD-10-CM | POA: Insufficient documentation

## 2013-01-07 DIAGNOSIS — Z96659 Presence of unspecified artificial knee joint: Secondary | ICD-10-CM | POA: Insufficient documentation

## 2013-01-07 DIAGNOSIS — M25569 Pain in unspecified knee: Secondary | ICD-10-CM | POA: Insufficient documentation

## 2013-01-07 DIAGNOSIS — IMO0001 Reserved for inherently not codable concepts without codable children: Secondary | ICD-10-CM | POA: Insufficient documentation

## 2013-01-09 ENCOUNTER — Ambulatory Visit: Payer: Medicare PPO | Admitting: Physical Therapy

## 2013-01-13 ENCOUNTER — Ambulatory Visit: Payer: Medicare PPO | Admitting: Physical Therapy

## 2013-01-15 ENCOUNTER — Ambulatory Visit: Payer: Medicare PPO | Admitting: Rehabilitation

## 2013-01-16 ENCOUNTER — Ambulatory Visit: Payer: Medicare PPO | Attending: Orthopedic Surgery | Admitting: Rehabilitation

## 2013-01-16 DIAGNOSIS — R269 Unspecified abnormalities of gait and mobility: Secondary | ICD-10-CM | POA: Insufficient documentation

## 2013-01-16 DIAGNOSIS — M6281 Muscle weakness (generalized): Secondary | ICD-10-CM | POA: Insufficient documentation

## 2013-01-16 DIAGNOSIS — IMO0001 Reserved for inherently not codable concepts without codable children: Secondary | ICD-10-CM | POA: Insufficient documentation

## 2013-01-16 DIAGNOSIS — Z96659 Presence of unspecified artificial knee joint: Secondary | ICD-10-CM | POA: Insufficient documentation

## 2013-01-16 DIAGNOSIS — M25569 Pain in unspecified knee: Secondary | ICD-10-CM | POA: Insufficient documentation

## 2013-01-16 DIAGNOSIS — M25669 Stiffness of unspecified knee, not elsewhere classified: Secondary | ICD-10-CM | POA: Insufficient documentation

## 2013-01-20 ENCOUNTER — Ambulatory Visit: Payer: Medicare PPO | Admitting: Physical Therapy

## 2013-01-23 ENCOUNTER — Ambulatory Visit: Payer: Medicare PPO | Admitting: Rehabilitation

## 2013-01-27 ENCOUNTER — Ambulatory Visit: Payer: Medicare PPO | Admitting: Physical Therapy

## 2013-01-29 ENCOUNTER — Ambulatory Visit: Payer: Medicare PPO | Admitting: Rehabilitation

## 2013-01-30 ENCOUNTER — Ambulatory Visit: Payer: Medicare PPO | Admitting: Physical Therapy

## 2013-02-03 ENCOUNTER — Ambulatory Visit: Payer: Medicare PPO | Admitting: Physical Therapy

## 2013-02-05 ENCOUNTER — Ambulatory Visit: Payer: Medicare PPO | Admitting: Rehabilitation

## 2013-02-06 ENCOUNTER — Ambulatory Visit: Payer: Medicare PPO | Admitting: Physical Therapy

## 2013-02-12 ENCOUNTER — Ambulatory Visit: Payer: Medicare PPO | Admitting: Physical Therapy

## 2013-02-13 ENCOUNTER — Ambulatory Visit: Payer: Medicare PPO | Admitting: Rehabilitation

## 2013-02-17 ENCOUNTER — Ambulatory Visit: Payer: Medicare PPO | Attending: Orthopedic Surgery | Admitting: Physical Therapy

## 2013-02-17 DIAGNOSIS — Z96659 Presence of unspecified artificial knee joint: Secondary | ICD-10-CM | POA: Insufficient documentation

## 2013-02-17 DIAGNOSIS — R269 Unspecified abnormalities of gait and mobility: Secondary | ICD-10-CM | POA: Insufficient documentation

## 2013-02-17 DIAGNOSIS — M6281 Muscle weakness (generalized): Secondary | ICD-10-CM | POA: Insufficient documentation

## 2013-02-17 DIAGNOSIS — M25569 Pain in unspecified knee: Secondary | ICD-10-CM | POA: Insufficient documentation

## 2013-02-17 DIAGNOSIS — M25669 Stiffness of unspecified knee, not elsewhere classified: Secondary | ICD-10-CM | POA: Insufficient documentation

## 2013-02-17 DIAGNOSIS — IMO0001 Reserved for inherently not codable concepts without codable children: Secondary | ICD-10-CM | POA: Insufficient documentation

## 2013-02-19 ENCOUNTER — Ambulatory Visit: Payer: Medicare PPO | Admitting: Rehabilitation

## 2013-02-20 ENCOUNTER — Ambulatory Visit: Payer: Medicare PPO | Admitting: Physical Therapy

## 2013-02-25 ENCOUNTER — Ambulatory Visit: Payer: Medicare PPO | Admitting: Physical Therapy

## 2013-02-27 ENCOUNTER — Ambulatory Visit: Payer: Medicare PPO | Admitting: Rehabilitation

## 2013-03-03 ENCOUNTER — Ambulatory Visit: Payer: Medicare PPO | Admitting: Rehabilitation

## 2013-03-11 ENCOUNTER — Ambulatory Visit: Payer: Medicare PPO | Admitting: Rehabilitation

## 2013-03-13 ENCOUNTER — Ambulatory Visit: Payer: Medicare PPO | Admitting: Rehabilitation

## 2013-03-18 ENCOUNTER — Ambulatory Visit: Payer: Medicare PPO | Attending: Orthopedic Surgery | Admitting: Physical Therapy

## 2013-03-18 DIAGNOSIS — Z96659 Presence of unspecified artificial knee joint: Secondary | ICD-10-CM | POA: Insufficient documentation

## 2013-03-18 DIAGNOSIS — M6281 Muscle weakness (generalized): Secondary | ICD-10-CM | POA: Insufficient documentation

## 2013-03-18 DIAGNOSIS — IMO0001 Reserved for inherently not codable concepts without codable children: Secondary | ICD-10-CM | POA: Insufficient documentation

## 2013-03-18 DIAGNOSIS — R269 Unspecified abnormalities of gait and mobility: Secondary | ICD-10-CM | POA: Insufficient documentation

## 2013-03-18 DIAGNOSIS — M25569 Pain in unspecified knee: Secondary | ICD-10-CM | POA: Insufficient documentation

## 2013-03-18 DIAGNOSIS — M25669 Stiffness of unspecified knee, not elsewhere classified: Secondary | ICD-10-CM | POA: Insufficient documentation

## 2013-07-16 ENCOUNTER — Ambulatory Visit (INDEPENDENT_AMBULATORY_CARE_PROVIDER_SITE_OTHER): Payer: Medicare PPO | Admitting: Internal Medicine

## 2013-07-16 ENCOUNTER — Encounter: Payer: Self-pay | Admitting: Internal Medicine

## 2013-07-16 VITALS — BP 132/74 | HR 78 | Ht 65.0 in | Wt 170.6 lb

## 2013-07-16 DIAGNOSIS — R9431 Abnormal electrocardiogram [ECG] [EKG]: Secondary | ICD-10-CM

## 2013-07-16 DIAGNOSIS — R0600 Dyspnea, unspecified: Secondary | ICD-10-CM

## 2013-07-16 DIAGNOSIS — R0609 Other forms of dyspnea: Secondary | ICD-10-CM | POA: Insufficient documentation

## 2013-07-16 DIAGNOSIS — R0989 Other specified symptoms and signs involving the circulatory and respiratory systems: Secondary | ICD-10-CM

## 2013-07-16 DIAGNOSIS — R0789 Other chest pain: Secondary | ICD-10-CM

## 2013-07-16 NOTE — Patient Instructions (Signed)
Your physician has requested that you have a lexiscan myoview. For further information please visit [email or web address] Please follow instruction sheet, as given. We will call you with the results.  Your physician wants you to follow-up in: 1 year. You will receive a reminder letter in the mail two months in advance. If you don't receive a letter, please call our office to schedule the follow-up appointment.

## 2013-07-16 NOTE — Progress Notes (Signed)
OFFICE NOTE  Chief Complaint:  Dyspnea, chest pressure  Primary Care Physician: Julia Organ., MD  HPI:  [name] is a pleasant 72 year old female with a history of asthma which she says she's had for about 3 years, hypertension and dyslipidemia. There is also strong family history of coronary disease. This past week she had an episode where she felt lightheaded and had an onset of shortness of breath and profuse sweating while she was cooking. The symptoms cause her to need to sit down and it lasted for about 5 minutes. This is also associated with chest tightness but no particular chest pain. She denies any chest pressure with exertion, but does have shortness of breath especially when walking up stairs. She denies any history of smoking and does not carry diagnosis of COPD. She also recently had an episode of sinus congestion and a bronchitic type cough for which she is taking amoxicillin. This does seem to be improving her symptoms. She reports that she had a prior stress test in the distant past by Dr. Deborah Mullen, but this was reportedly normal. She was referred for cardiovascular evaluation for possible angina.  PMHx:  Past Medical History  Diagnosis Date  . Asthma   . Arthritis   . Hypertension     pcp   [name]     . GERD (gastroesophageal reflux disease)     Past Surgical History  Procedure Laterality Date  . Rotator cuff repair    . Abdominal hysterectomy    . 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  SOCHx:   reports that she has never smoked. She has never used smokeless tobacco. She reports that she does not drink alcohol or use illicit drugs.  ALLERGIES:  Allergies  Allergen Reactions  . Biaxin  [Clarithromycin] Other (See Comments)    Elevated heart rate  . Oxycontin [Oxycodone]     Lips swell  . Vicodin [Hydrocodone-Acetaminophen]     swelling    ROS: A comprehensive review of systems was negative except for: Ears, Mullen, mouth, throat, and face: positive for nasal congestion Respiratory: positive for chronic bronchitis, cough and dyspnea on exertion Cardiovascular: positive for chest pressure/discomfort  HOME MEDS: Current Outpatient Prescriptions  Medication Sig Dispense Refill  . albuterol (PROVENTIL HFA;VENTOLIN HFA) 108 (90 BASE) MCG/ACT inhaler Inhale 2 puffs into the lungs every 6 (six) hours as needed for wheezing.      Marland Kitchen amoxicillin (AMOXIL) 500 MG capsule Take 1,000 mg by mouth 2 (two) times daily.      . budesonide (PULMICORT) 0.25 MG/2ML nebulizer solution Take 0.25 mg by nebulization 3 (three) times daily.      . calcium carbonate (TUMS EX) 750 MG chewable tablet Chew 1 tablet by mouth daily.      . Cholecalciferol (VITAMIN D-3) 1000 UNITS CAPS Take 1 capsule by mouth 2 (two) times daily.      . hydrochlorothiazide (MICROZIDE) 12.5 MG capsule Take 12.5 mg by mouth daily.      Marland Kitchen losartan (COZAAR) 100 MG tablet Take 100 mg by mouth daily.      . simvastatin (ZOCOR) 80 MG tablet Take 80 mg by mouth at bedtime.      Marland Kitchen  traMADol (ULTRAM) 50 MG tablet Take 50 mg by mouth every 6 (six) hours as needed for pain.       No current facility-administered medications for this visit.    LABS/IMAGING: No results found for this or any previous visit (from the past 48 hour(s)). No results found.  VITALS: BP 132/74  Pulse 78  Ht 5\' 5"  (1.651 m)  Wt 170 lb 9.6 oz (77.384 kg)  BMI 28.39 kg/m2  EXAM: General appearance: alert and no distress Neck: no carotid bruit and no JVD Lungs: rhonchi bilaterally Heart: regular rate and rhythm, S1, S2 normal, no murmur, click, rub or gallop Abdomen: soft, non-tender; bowel sounds normal; no masses,  no organomegaly Extremities:  extremities normal, atraumatic, no cyanosis or edema Pulses: 2+ and symmetric Skin: Skin color, texture, turgor normal. No rashes or lesions Neurologic: Grossly normal Psych: Mood, affect normal  EKG: Normal sinus rhythm at 78  ASSESSMENT: 1. Chest pressure and dyspnea, associated with diaphoresis 2. Hypertension 3. Dyslipidemia 4. Family history of coronary disease  PLAN: 1.   [name] had an episode of chest pressure and dyspnea associated with diaphoresis. She had one subsequent episode was less severe. In addition she had recent sinusitis and possibly acute exacerbation of chronic bronchitis. This is improving with antibiotics. Her breathlessness could be related to that, however coronary disease cannot be excluded. Given her family history of heart disease which is strong as well as hypertension and dyslipidemia and her age, would recommend a plexus can nuclear stress test. She unfortunately is unable to exercise due to total knee replacement and limited mobility. I'll contact her with results of her stress test and plan to see her annually for risk factor modification.  Thank you again for this kind referral.  Julia Nose, MD, [facility] Attending Cardiologist [facility]  [name] C 07/16/2013, 9:06 AM

## 2013-07-24 ENCOUNTER — Ambulatory Visit (HOSPITAL_COMMUNITY)
Admission: RE | Admit: 2013-07-24 | Discharge: 2013-07-24 | Disposition: A | Discharge status: Home / Self Care | Payer: Medicare PPO | Source: Ambulatory Visit | Attending: Cardiovascular Disease | Admitting: Cardiovascular Disease

## 2013-07-24 DIAGNOSIS — R9431 Abnormal electrocardiogram [ECG] [EKG]: Secondary | ICD-10-CM | POA: Insufficient documentation

## 2013-07-24 DIAGNOSIS — R0789 Other chest pain: Secondary | ICD-10-CM | POA: Insufficient documentation

## 2013-07-24 DIAGNOSIS — R0600 Dyspnea, unspecified: Secondary | ICD-10-CM

## 2013-07-24 DIAGNOSIS — R0609 Other forms of dyspnea: Secondary | ICD-10-CM | POA: Insufficient documentation

## 2013-07-24 DIAGNOSIS — R0989 Other specified symptoms and signs involving the circulatory and respiratory systems: Secondary | ICD-10-CM | POA: Insufficient documentation

## 2013-07-24 MED ORDER — TECHNETIUM TC 99M SESTAMIBI GENERIC - CARDIOLITE
31.0000 | Freq: Once | INTRAVENOUS | Status: AC | PRN
  Administered 2013-07-24: 31 via INTRAVENOUS

## 2013-07-24 MED ORDER — TECHNETIUM TC 99M SESTAMIBI GENERIC - CARDIOLITE
10.4000 | Freq: Once | INTRAVENOUS | Status: AC | PRN
  Administered 2013-07-24: 10 via INTRAVENOUS

## 2013-07-24 MED ORDER — REGADENOSON 0.4 MG/5ML IV SOLN
0.4000 mg | Freq: Once | INTRAVENOUS | Status: AC
  Administered 2013-07-24: 0.4 mg via INTRAVENOUS

## 2013-07-24 NOTE — Procedures (Addendum)
[facility] [address] [place] [address] [phone]  Cardiology Nuclear Med Study  [name] is a 72 y.o. female     MRN : [id number]     DOB: [birth year]  Procedure Date: 07/24/2013  Nuclear Med Background Indication for Stress Test:  Evaluation for Ischemia History:  Asthma Cardiac Risk Factors: Family History - CAD, Hypertension, Lipids and Obesity  Symptoms:  Chest Pain and DOE   Nuclear Pre-Procedure Caffeine/Decaff Intake:  7:00pm NPO After: 5:00am   IV Site: R Hand  IV 0.9% NS with Angio Cath:  22g  Chest Size (in):  n/a IV Started by: Emmit Pomfret, RN  Height: 5\' 5"  (1.651 m)  Cup Size: B  BMI:  Body mass index is 28.29 kg/(m^2). Weight:  170 lb (77.111 kg)   Tech Comments:  n/a    Nuclear Med Study 1 or 2 day study: 1 day  Stress Test Type:  Lexiscan  Order Authorizing Provider:  Zoila Shutter, MD   Resting Radionuclide: Technetium 54m Sestamibi  Resting Radionuclide Dose: 10.4 mCi   Stress Radionuclide:  Technetium 68m Sestamibi  Stress Radionuclide Dose: 31.0 mCi           Stress Protocol Rest HR: 67 Stress HR: 90  Rest BP: 147/70 Stress BP: 162/69  Exercise Time (min): n/a METS: n/a   Predicted Max HR: 146 bpm % Max HR: 61.64 bpm Rate Pressure Product: 46962  Dose of Adenosine (mg):  n/a Dose of Lexiscan: 0.4 mg  Dose of Atropine (mg): n/a Dose of Dobutamine: n/a mcg/kg/min (at max HR)  Stress Test Technologist: Esperanza Sheets, CCT Nuclear Technologist: Gonzella Lex, CNMT   Rest Procedure:  Myocardial perfusion imaging was performed at rest 45 minutes following the intravenous administration of Technetium 64m Sestamibi. Stress Procedure:  The patient received IV Lexiscan 0.4 mg over 15-seconds.  Technetium 81m Sestamibi injected at 30-seconds.  There were no significant changes with Lexiscan.  Quantitative spect images were obtained after a 45 minute delay.  Transient Ischemic  Dilatation (Normal <1.22):  0.95 Lung/Heart Ratio (Normal <0.45):  0.32 QGS EDV:  54 ml QGS ESV:  15 ml LV Ejection Fraction: 72%     Rest ECG: NSR - Normal EKG  Stress ECG: There are scattered PVCs.  QPS Raw Data Images:  Normal; no motion artifact; normal heart/lung ratio. Stress Images:  There is a tiny apical area of mildly reduced tracer uptake. Rest Images:  the apical defect is no longer apparent Subtraction (SDS):  These findings are consistent with ischemia. LV Wall Motion:  NL LV Function; NL Wall Motion  Impression Exercise Capacity:  Lexiscan with no exercise. BP Response:  Normal blood pressure response. Clinical Symptoms:  No significant symptoms noted. ECG Impression:  No significant ST segment change suggestive of ischemia. Comparison with Prior Nuclear Study: No images to compare  Overall Impression:  Low risk stress nuclear study with a tiny apical defect. A similar appearance can be generated by "shifting breast" attenuation artifact.     Thurmon Fair, MD  07/24/2013 1:40 PM

## 2013-09-19 ENCOUNTER — Encounter (HOSPITAL_COMMUNITY): Payer: Self-pay | Admitting: Pharmacy Technician

## 2013-09-23 ENCOUNTER — Other Ambulatory Visit (HOSPITAL_COMMUNITY): Payer: Self-pay | Admitting: Orthopedic Surgery

## 2013-09-24 NOTE — Pre-Procedure Instructions (Signed)
[name]  09/24/2013   Your procedure is scheduled on:  Wednesday October 01, 2013  Report [facility] Entrance "A" [address] at [employer]6:30 AM.  Call this number if you have problems the morning of surgery: [phone]   Remember:   Do not eat food or drink liquids after midnight.   Take these medicines the morning of surgery with A SIP OF WATER: Albuterol inhaler, Pulmicort (nebulizer) if needed, losartan (Cozaar), and tramadol (Ultram) if needed  STOP taking Aspirin, Goody's, BC's, Aleve (Naproxen), Ibuprofen (Advil or Motrin), Fish Oil, Vitamins, Herbal Supplements or any substance that could thin your blood starting today.   Do not wear jewelry, make-up or nail polish.  Do not wear lotions, powders, or perfumes. You may wear deodorant.  Do not shave 48 hours prior to surgery.  Do not bring valuables to the hospital.  [facility] is not responsible for any belongings or valuables.               Contacts, dentures or bridgework may not be worn into surgery.  Leave suitcase in the car. After surgery it may be brought to your room.  For patients admitted to the hospital, discharge time is determined by your treatment team.              Special Instructions: Shower using CHG 2 nights before surgery and the night before surgery.  If you shower the day of surgery use CHG.  Use special wash - you have one bottle of CHG for all showers.  You should use approximately 1/3 of the bottle for each shower.   Please read over the following fact sheets that you were given: Pain Booklet, Coughing and Deep Breathing, Blood Transfusion Information, Total Joint Packet, MRSA Information and Surgical Site Infection Prevention

## 2013-09-25 ENCOUNTER — Encounter (HOSPITAL_COMMUNITY)
Admission: RE | Admit: 2013-09-25 | Discharge: 2013-09-25 | Disposition: A | Discharge status: Home / Self Care | Payer: Medicare PPO | Source: Ambulatory Visit | Attending: Orthopedic Surgery | Admitting: Orthopedic Surgery

## 2013-09-25 ENCOUNTER — Encounter (HOSPITAL_COMMUNITY): Payer: Self-pay

## 2013-09-25 DIAGNOSIS — Z01812 Encounter for preprocedural laboratory examination: Secondary | ICD-10-CM | POA: Insufficient documentation

## 2013-09-25 DIAGNOSIS — Z01818 Encounter for other preprocedural examination: Secondary | ICD-10-CM | POA: Insufficient documentation

## 2013-09-25 LAB — CBC
HCT: 38.1 % (ref 36.0–46.0)
Hemoglobin: 12.9 g/dL (ref 12.0–15.0)
MCH: 28.7 pg (ref 26.0–34.0)
MCHC: 33.9 g/dL (ref 30.0–36.0)
MCV: 84.9 fL (ref 78.0–100.0)
Platelets: 304 10*3/uL (ref 150–400)
RBC: 4.49 MIL/uL (ref 3.87–5.11)
RDW: 13.5 % (ref 11.5–15.5)
WBC: 14.2 10*3/uL — ABNORMAL HIGH (ref 4.0–10.5)

## 2013-09-25 LAB — APTT: aPTT: 23 seconds — ABNORMAL LOW (ref 24–37)

## 2013-09-25 LAB — COMPREHENSIVE METABOLIC PANEL
ALT: 13 U/L (ref 0–35)
AST: 12 U/L (ref 0–37)
Albumin: 3.7 g/dL (ref 3.5–5.2)
Alkaline Phosphatase: 76 U/L (ref 39–117)
BUN: 28 mg/dL — ABNORMAL HIGH (ref 6–23)
CO2: 23 mEq/L (ref 19–32)
Calcium: 9.5 mg/dL (ref 8.4–10.5)
Chloride: 96 mEq/L (ref 96–112)
Creatinine, Ser: 1.06 mg/dL (ref 0.50–1.10)
GFR calc Af Amer: 58 mL/min — ABNORMAL LOW (ref >90)
GFR calc non Af Amer: 50 mL/min — ABNORMAL LOW (ref >90)
Glucose, Bld: 171 mg/dL — ABNORMAL HIGH (ref 70–99)
Potassium: 4 mEq/L (ref 3.7–5.3)
Sodium: 136 mEq/L — ABNORMAL LOW (ref 137–147)
Total Bilirubin: 0.3 mg/dL (ref 0.3–1.2)
Total Protein: 7.3 g/dL (ref 6.0–8.3)

## 2013-09-25 LAB — PROTIME-INR
INR: 0.96 (ref 0.00–1.49)
Prothrombin Time: 12.6 seconds (ref 11.6–15.2)

## 2013-09-25 LAB — TYPE AND SCREEN
ABO/RH(D): B POS
Antibody Screen: NEGATIVE

## 2013-09-25 LAB — SURGICAL PCR SCREEN
MRSA, PCR: NEGATIVE
Staphylococcus aureus: NEGATIVE

## 2013-09-25 NOTE — Progress Notes (Signed)
   Pt's PCP is Dr. Kathrynn RunningManning [address] [facility], [place]. Pt reports 2 view CXR performed 09/23/13, records requested.  Pt's Cardiologist is with [facility], stress test and EKG in epic. Pt reports last ECHO was more than 20 years ago and denies cardiac cath.  Pt reports currently being treated by PCP with oral antibiotics and Prednisone for cough that has lasted over a week. Pt denies fever and reports clear sputum. Dr. Audrie Liauda's office called and informed (spoke with Elnita Maxwellheryl, CMA). Pt reports that she completes Prednisone 09/26/13 and antibiotic 09/29/13.

## 2013-09-30 ENCOUNTER — Encounter (HOSPITAL_COMMUNITY): Payer: Self-pay | Admitting: Certified Registered"

## 2013-09-30 MED ORDER — CEFAZOLIN SODIUM-DEXTROSE 2-3 GM-% IV SOLR: 2.0000 g | INTRAVENOUS | Status: DC

## 2013-10-01 ENCOUNTER — Inpatient Hospital Stay (HOSPITAL_COMMUNITY): Admission: RE | Admit: 2013-10-01 | Payer: Medicare PPO | Source: Ambulatory Visit | Admitting: Orthopedic Surgery

## 2013-10-01 ENCOUNTER — Encounter (HOSPITAL_COMMUNITY): Admission: RE | Payer: Self-pay | Source: Ambulatory Visit

## 2013-10-01 SURGERY — ARTHROPLASTY, HIP, TOTAL,POSTERIOR APPROACH
Anesthesia: General | Site: Hip | Laterality: Left

## 2013-11-18 ENCOUNTER — Encounter (HOSPITAL_COMMUNITY): Payer: Self-pay | Admitting: Pharmacy Technician

## 2013-11-25 ENCOUNTER — Encounter (HOSPITAL_COMMUNITY): Payer: Self-pay

## 2013-11-25 ENCOUNTER — Encounter (HOSPITAL_COMMUNITY)
Admission: RE | Admit: 2013-11-25 | Discharge: 2013-11-25 | Disposition: A | Discharge status: Home / Self Care | Payer: Medicare PPO | Source: Ambulatory Visit | Attending: Orthopedic Surgery | Admitting: Orthopedic Surgery

## 2013-11-25 ENCOUNTER — Other Ambulatory Visit (HOSPITAL_COMMUNITY): Payer: Self-pay | Admitting: *Deleted

## 2013-11-25 DIAGNOSIS — Z01812 Encounter for preprocedural laboratory examination: Secondary | ICD-10-CM | POA: Insufficient documentation

## 2013-11-25 HISTORY — DX: Pneumonia, unspecified organism: J18.9

## 2013-11-25 LAB — CBC
HCT: 35.2 % — ABNORMAL LOW (ref 36.0–46.0)
Hemoglobin: 11.9 g/dL — ABNORMAL LOW (ref 12.0–15.0)
MCH: 28.9 pg (ref 26.0–34.0)
MCHC: 33.8 g/dL (ref 30.0–36.0)
MCV: 85.4 fL (ref 78.0–100.0)
Platelets: 339 10*3/uL (ref 150–400)
RBC: 4.12 MIL/uL (ref 3.87–5.11)
RDW: 13.9 % (ref 11.5–15.5)
WBC: 10.2 10*3/uL (ref 4.0–10.5)

## 2013-11-25 LAB — BASIC METABOLIC PANEL
BUN: 20 mg/dL (ref 6–23)
CO2: 26 mEq/L (ref 19–32)
Calcium: 9.7 mg/dL (ref 8.4–10.5)
Chloride: 99 mEq/L (ref 96–112)
Creatinine, Ser: 0.97 mg/dL (ref 0.50–1.10)
GFR calc Af Amer: 65 mL/min — ABNORMAL LOW (ref >90)
GFR calc non Af Amer: 56 mL/min — ABNORMAL LOW (ref >90)
Glucose, Bld: 184 mg/dL — ABNORMAL HIGH (ref 70–99)
Potassium: 4.6 mEq/L (ref 3.7–5.3)
Sodium: 139 mEq/L (ref 137–147)

## 2013-11-25 LAB — APTT: aPTT: 27 seconds (ref 24–37)

## 2013-11-25 LAB — PROTIME-INR
INR: 1 (ref 0.00–1.49)
Prothrombin Time: 13 seconds (ref 11.6–15.2)

## 2013-11-25 LAB — TYPE AND SCREEN
ABO/RH(D): B POS
Antibody Screen: NEGATIVE

## 2013-11-25 LAB — SURGICAL PCR SCREEN
MRSA, PCR: NEGATIVE
Staphylococcus aureus: NEGATIVE

## 2013-11-25 NOTE — Progress Notes (Signed)
No pre-op orders in EPIC, called and spoke with Consuello Bossierheryl Bennett at Dr. Audrie Liauda's office and she states she will get them in.

## 2013-11-25 NOTE — Pre-Procedure Instructions (Signed)
[name]  11/25/2013   Your procedure is scheduled on:  Wednesday, December 03, 2013 at 9:00 AM.   Report to [facility] Entrance "A" Admitting Office at 7:00 AM.   Call this number if you have problems the morning of surgery: [phone]   Remember:   Do not eat food or drink liquids after midnight Tuesday, 12/02/13.   Take these medicines the morning of surgery with A SIP OF WATER: You may use your inhaler and your nebulizer treatment the morning of surgery if you need it. Please bring your inhaler with you the morning of surgery.  Stop Naproxen (Aleve) as of tomorrow.   Do not wear jewelry, make-up or nail polish.  Do not wear lotions, powders, or perfumes. You may wear deodorant.  Do not shave 48 hours prior to surgery.   Do not bring valuables to the hospital.  [facility] is not responsible                  for any belongings or valuables.               Contacts, dentures or bridgework may not be worn into surgery.  Leave suitcase in the car. After surgery it may be brought to your room.  For patients admitted to the hospital, discharge time is determined by your                treatment team.              Special Instructions: [facility] - Preparing for Surgery  Before surgery, you can play an important role.  Because skin is not sterile, your skin needs to be as free of germs as possible.  You can reduce the number of germs on you skin by washing with CHG (chlorahexidine gluconate) soap before surgery.  CHG is an antiseptic cleaner which kills germs and bonds with the skin to continue killing germs even after washing.  Please DO NOT use if you have an allergy to CHG or antibacterial soaps.  If your skin becomes reddened/irritated stop using the CHG and inform your nurse when you arrive at Short Stay.  Do not shave (including legs and underarms) for at least 48 hours prior to the first CHG shower.  You may shave your face.  Please follow these instructions  carefully:   1.  Shower with CHG Soap the night before surgery and the                                morning of Surgery.  2.  If you choose to wash your hair, wash your hair first as usual with your       normal shampoo.  3.  After you shampoo, rinse your hair and body thoroughly to remove the                      Shampoo.  4.  Use CHG as you would any other liquid soap.  You can apply chg directly       to the skin and wash gently with scrungie or a clean washcloth.  5.  Apply the CHG Soap to your body ONLY FROM THE NECK DOWN.        Do not use on open wounds or open sores.  Avoid contact with your eyes, ears, mouth and genitals (private parts).  Wash genitals (private parts) with  your normal soap.  6.  Wash thoroughly, paying special attention to the area where your surgery        will be performed.  7.  Thoroughly rinse your body with warm water from the neck down.  8.  DO NOT shower/wash with your normal soap after using and rinsing off       the CHG Soap.  9.  Pat yourself dry with a clean towel.            10.  Wear clean pajamas.            11.  Place clean sheets on your bed the night of your first shower and do not        sleep with pets.  Day of Surgery  Do not apply any lotions the morning of surgery.  Please wear clean clothes to the hospital/surgery center.     Please read over the following fact sheets that you were given: Pain Booklet, Coughing and Deep Breathing, Blood Transfusion Information, MRSA Information and Surgical Site Infection Prevention

## 2013-11-25 NOTE — Progress Notes (Signed)
Pt has CHG surgical scrub at home, did not give her one today.

## 2013-12-02 NOTE — Progress Notes (Signed)
Pt notified of time change;to arrive at 0630 

## 2013-12-03 ENCOUNTER — Encounter (HOSPITAL_COMMUNITY): Payer: Self-pay | Admitting: *Deleted

## 2013-12-03 ENCOUNTER — Encounter (HOSPITAL_COMMUNITY): Payer: Medicare PPO | Admitting: Certified Registered Nurse Anesthetist

## 2013-12-03 ENCOUNTER — Inpatient Hospital Stay (HOSPITAL_COMMUNITY): Payer: Medicare PPO

## 2013-12-03 ENCOUNTER — Encounter (HOSPITAL_COMMUNITY)
Admission: RE | Disposition: A | Discharge status: Home Health | Payer: Self-pay | Source: Ambulatory Visit | Attending: Orthopedic Surgery

## 2013-12-03 ENCOUNTER — Inpatient Hospital Stay (HOSPITAL_COMMUNITY): Payer: Medicare PPO | Admitting: Certified Registered Nurse Anesthetist

## 2013-12-03 ENCOUNTER — Inpatient Hospital Stay (HOSPITAL_COMMUNITY)
Admission: RE | Admit: 2013-12-03 | Discharge: 2013-12-05 | DRG: 470 | Disposition: A | Discharge status: Home Health | Payer: Medicare PPO | Source: Ambulatory Visit | Attending: Orthopedic Surgery | Admitting: Orthopedic Surgery

## 2013-12-03 DIAGNOSIS — M899 Disorder of bone, unspecified: Secondary | ICD-10-CM | POA: Diagnosis present

## 2013-12-03 DIAGNOSIS — Z96649 Presence of unspecified artificial hip joint: Secondary | ICD-10-CM

## 2013-12-03 DIAGNOSIS — Z79899 Other long term (current) drug therapy: Secondary | ICD-10-CM

## 2013-12-03 DIAGNOSIS — M1610 Unilateral primary osteoarthritis, unspecified hip: Principal | ICD-10-CM | POA: Diagnosis present

## 2013-12-03 DIAGNOSIS — E785 Hyperlipidemia, unspecified: Secondary | ICD-10-CM | POA: Diagnosis present

## 2013-12-03 DIAGNOSIS — K219 Gastro-esophageal reflux disease without esophagitis: Secondary | ICD-10-CM | POA: Diagnosis present

## 2013-12-03 DIAGNOSIS — M949 Disorder of cartilage, unspecified: Secondary | ICD-10-CM

## 2013-12-03 DIAGNOSIS — Z9660 Presence of unspecified orthopedic joint implant: Secondary | ICD-10-CM | POA: Insufficient documentation

## 2013-12-03 DIAGNOSIS — J45909 Unspecified asthma, uncomplicated: Secondary | ICD-10-CM | POA: Diagnosis present

## 2013-12-03 DIAGNOSIS — M169 Osteoarthritis of hip, unspecified: Principal | ICD-10-CM | POA: Diagnosis present

## 2013-12-03 DIAGNOSIS — I10 Essential (primary) hypertension: Secondary | ICD-10-CM | POA: Diagnosis present

## 2013-12-03 HISTORY — PX: TOTAL HIP ARTHROPLASTY: SHX124

## 2013-12-03 HISTORY — DX: Unspecified chronic bronchitis: J42

## 2013-12-03 HISTORY — DX: Pure hypercholesterolemia, unspecified: E78.00

## 2013-12-03 IMAGING — CR DG CHEST 2V
2 series · 2 of 2 positions shown · non-contrast
Comparison: PA and lateral chest 11/27/2012.

CLINICAL DATA: Preoperative films.  Patient for hip replacement.

EXAM:
CHEST  2 VIEW

[w chest pa]
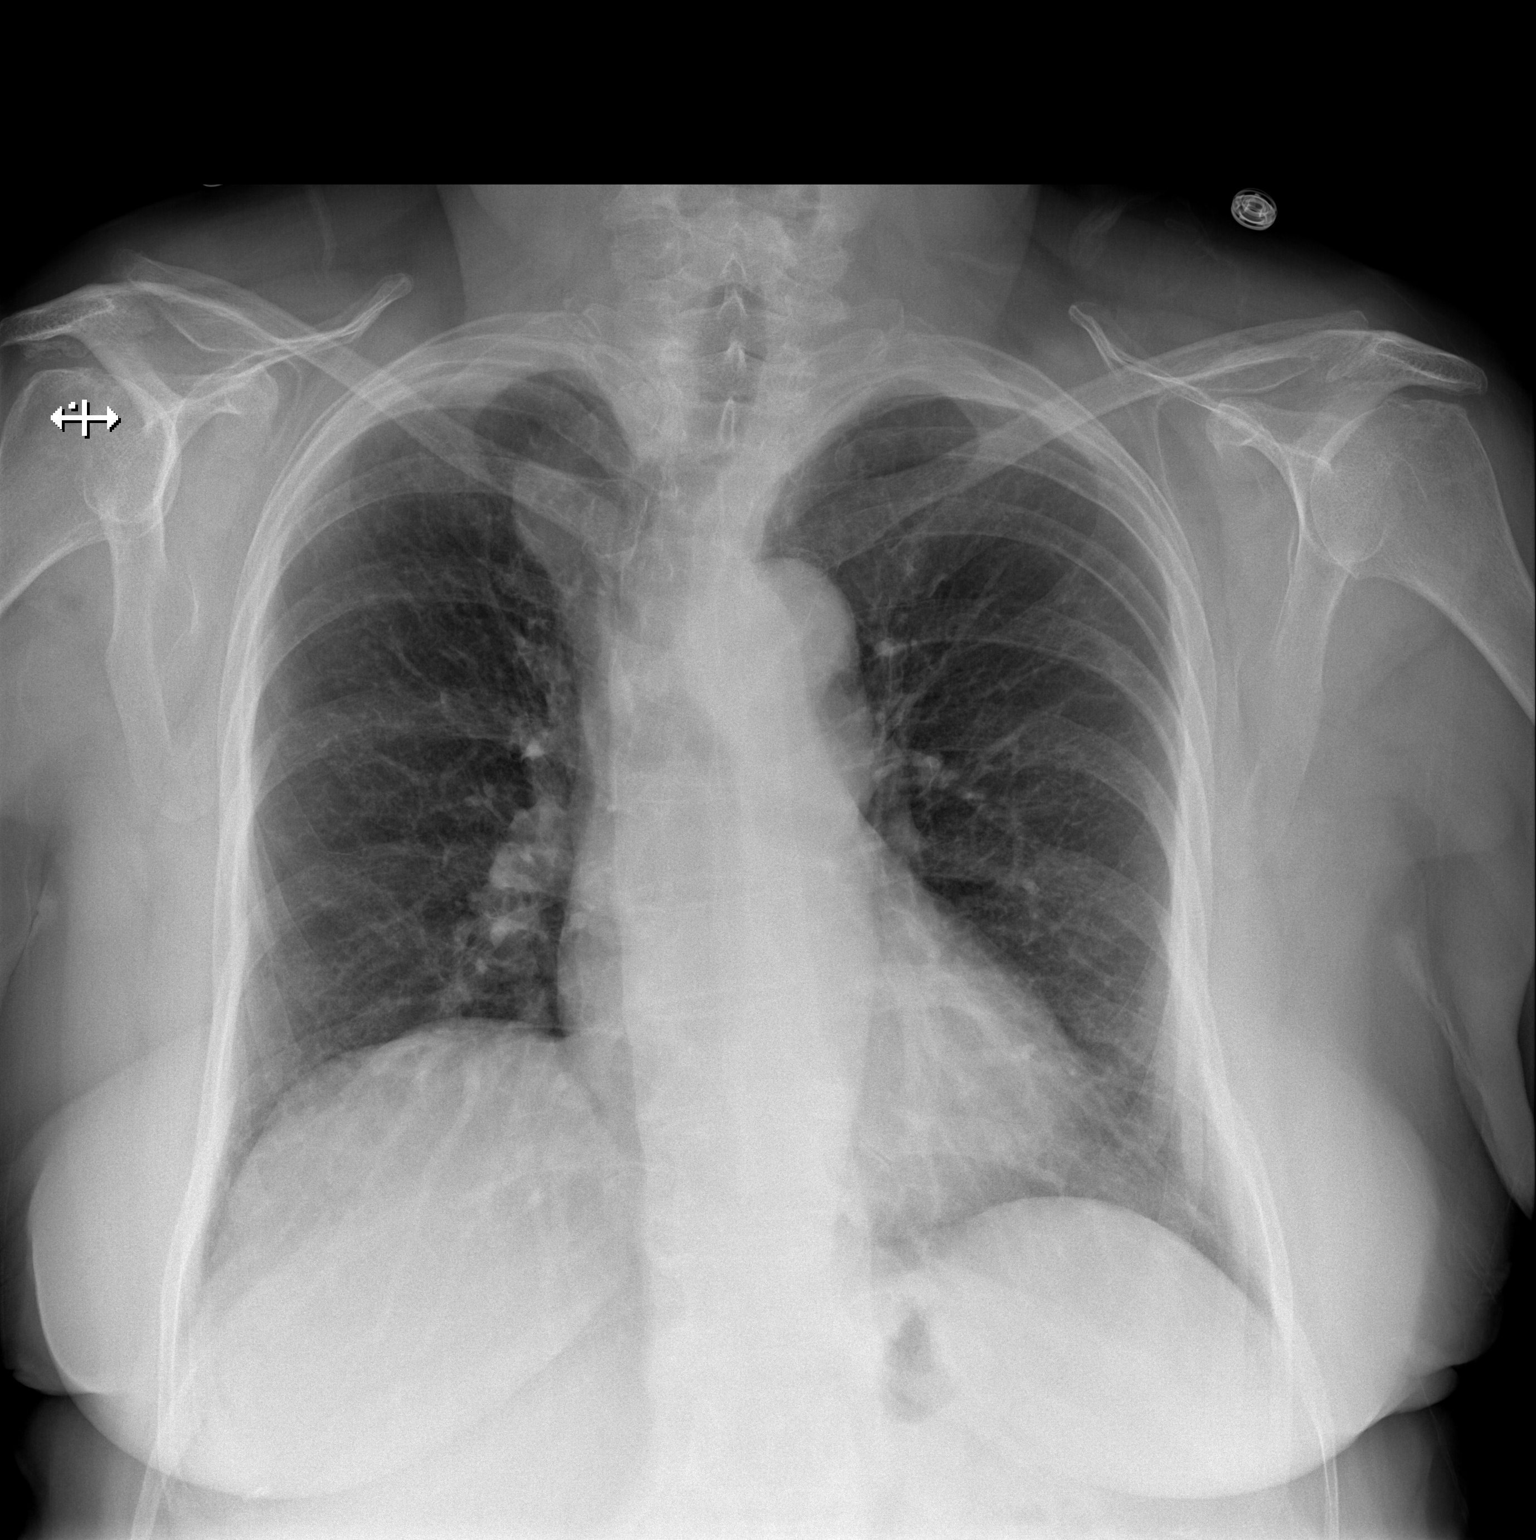

[w chest lat]
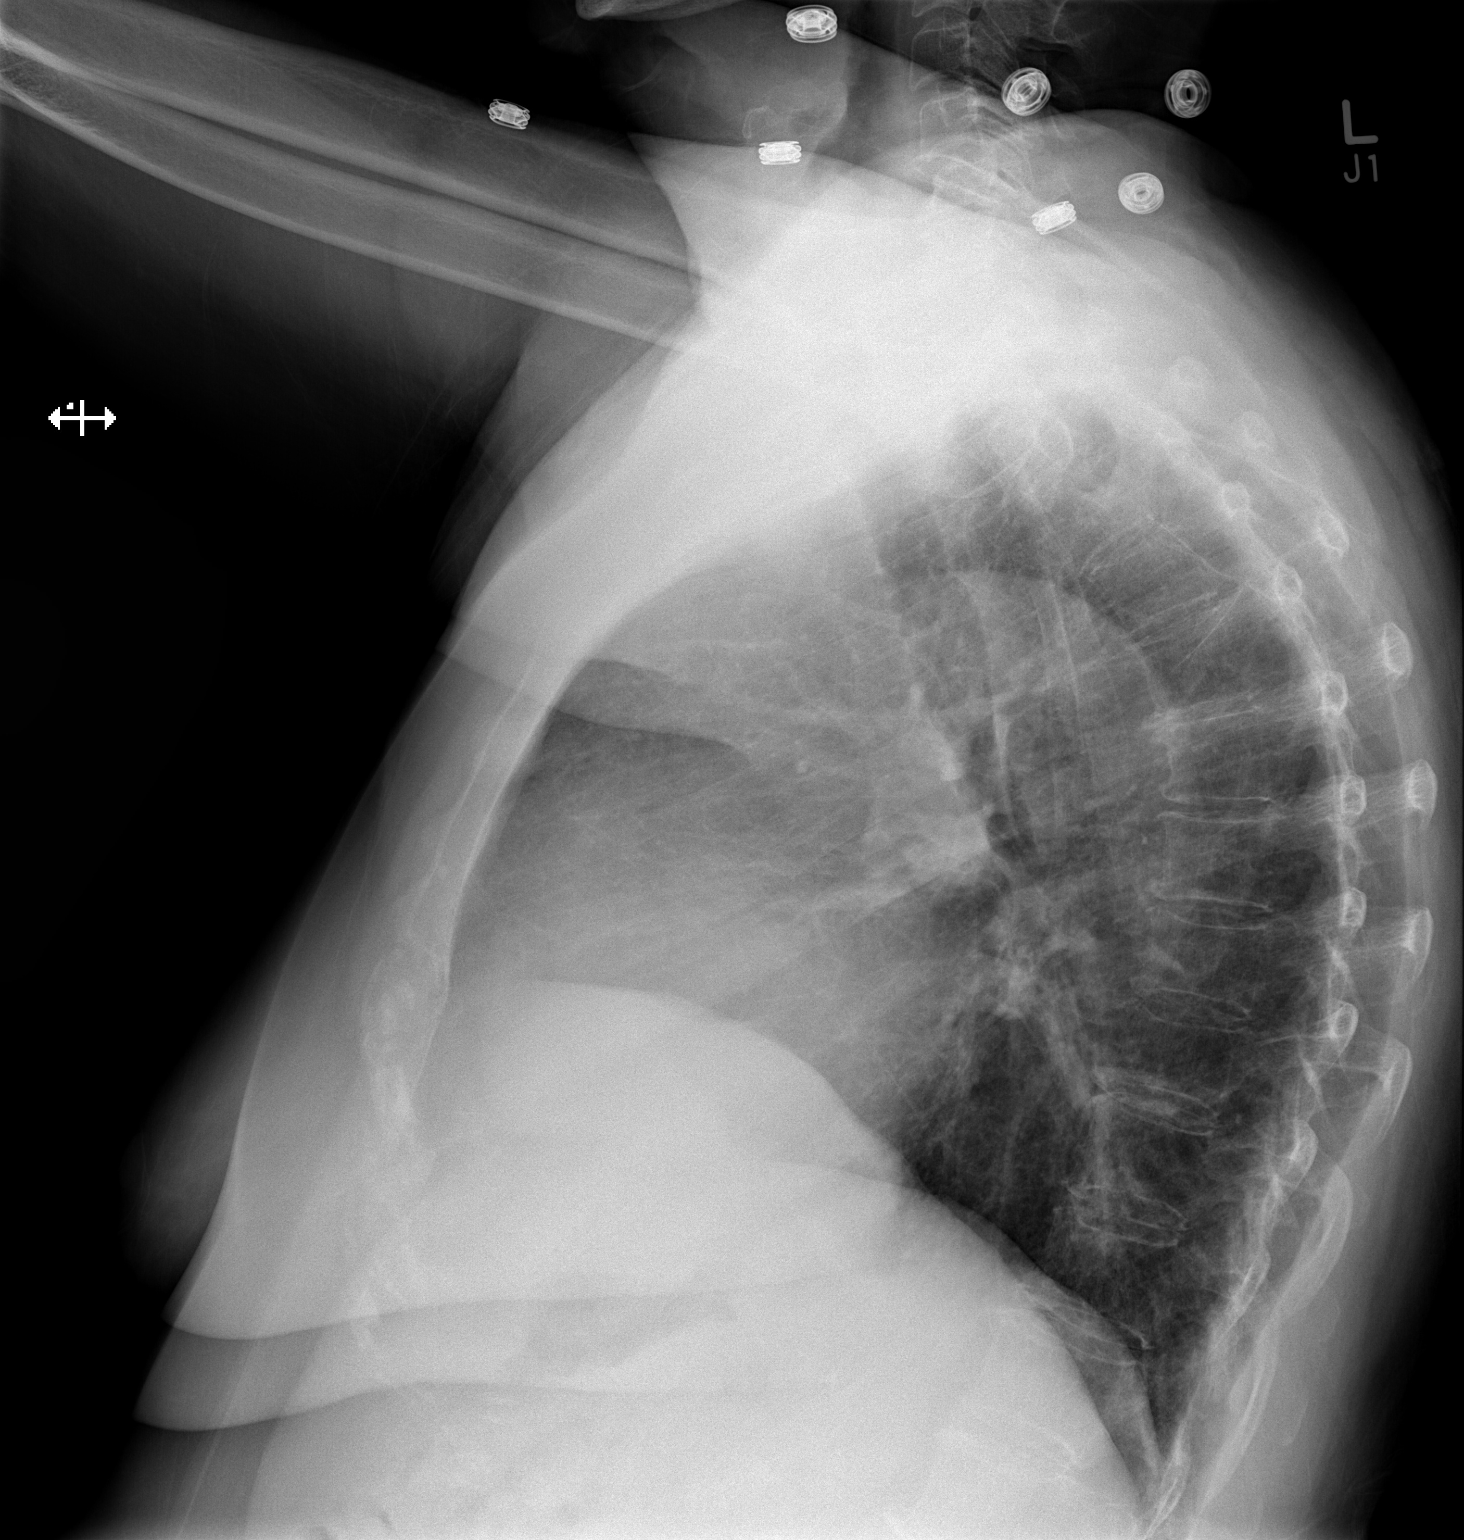

[2 of 2 positions shown; findings below may reference images not displayed]

FINDINGS: The lungs are clear. Heart size is normal. No pneumothorax or
pleural effusion. Mild elevation of the right hemidiaphragm is
noted, unchanged.
IMPRESSION: No acute finding.

## 2013-12-03 IMAGING — DX DG HIP 1V PORT*L*
1 series · 1 of 1 positions shown · non-contrast
Comparison: None.

CLINICAL DATA: postop

EXAM:
PORTABLE LEFT HIP - 1 VIEW

[ap]
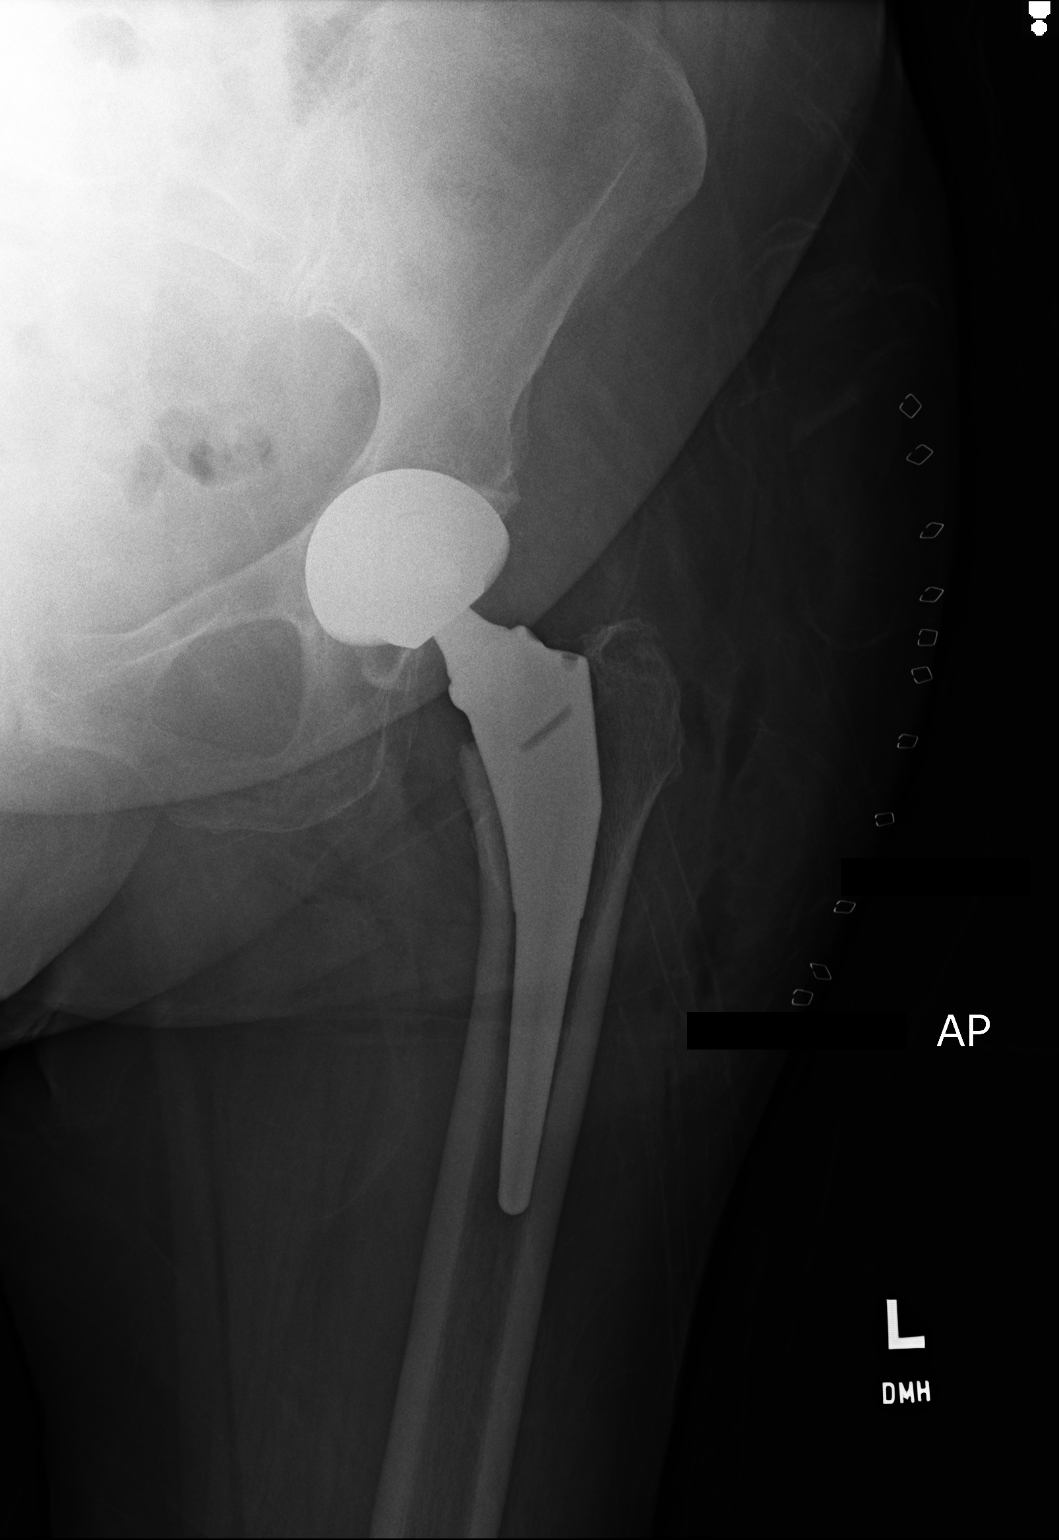

[1 of 1 positions shown; findings below may reference images not displayed]

FINDINGS: Patient is status post total left hip arthroplasty. Hardware appears
intact without evidence of loosening or failure. Native osseous
structures appear unremarkable. Skin staples are identified along
the lateral aspect of the soft tissues of the hip is well as
postsurgical changes within the soft tissues.
IMPRESSION: Patient is status post total left hip arthroplasty.

## 2013-12-03 SURGERY — ARTHROPLASTY, HIP, TOTAL,POSTERIOR APPROACH
Anesthesia: General | Site: Hip | Laterality: Left

## 2013-12-03 MED ORDER — HYDROCHLOROTHIAZIDE 12.5 MG PO CAPS
12.5000 mg | ORAL_CAPSULE | Freq: Every morning | ORAL | Status: DC
  Administered 2013-12-04 – 2013-12-05 (×2): 12.5 mg via ORAL
  Filled 2013-12-03 (×3): qty 1

## 2013-12-03 MED ORDER — FENTANYL CITRATE 0.05 MG/ML IJ SOLN
INTRAMUSCULAR | Status: DC | PRN
  Administered 2013-12-03: 150 ug via INTRAVENOUS
  Administered 2013-12-03 (×3): 25 ug via INTRAVENOUS

## 2013-12-03 MED ORDER — FENTANYL CITRATE 0.05 MG/ML IJ SOLN
INTRAMUSCULAR | Status: AC
  Administered 2013-12-03: 10:00:00
  Filled 2013-12-03: qty 2

## 2013-12-03 MED ORDER — CEFAZOLIN SODIUM-DEXTROSE 2-3 GM-% IV SOLR
2.0000 g | INTRAVENOUS | Status: AC
  Administered 2013-12-03: 2 g via INTRAVENOUS
  Filled 2013-12-03: qty 50

## 2013-12-03 MED ORDER — ROCURONIUM BROMIDE 100 MG/10ML IV SOLN
INTRAVENOUS | Status: DC | PRN
  Administered 2013-12-03: 30 mg via INTRAVENOUS

## 2013-12-03 MED ORDER — ONDANSETRON HCL 4 MG/2ML IJ SOLN
INTRAMUSCULAR | Status: DC | PRN
  Administered 2013-12-03: 4 mg via INTRAVENOUS

## 2013-12-03 MED ORDER — GLYCOPYRROLATE 0.2 MG/ML IJ SOLN
INTRAMUSCULAR | Status: DC | PRN
  Administered 2013-12-03: .6 mg via INTRAVENOUS

## 2013-12-03 MED ORDER — SUCCINYLCHOLINE CHLORIDE 20 MG/ML IJ SOLN
INTRAMUSCULAR | Status: AC
  Filled 2013-12-03: qty 1

## 2013-12-03 MED ORDER — NEOSTIGMINE METHYLSULFATE 1 MG/ML IJ SOLN
INTRAMUSCULAR | Status: DC | PRN
  Administered 2013-12-03: 4 mg via INTRAVENOUS

## 2013-12-03 MED ORDER — MIDAZOLAM HCL 2 MG/2ML IJ SOLN
INTRAMUSCULAR | Status: AC
  Filled 2013-12-03: qty 2

## 2013-12-03 MED ORDER — PROPOFOL 10 MG/ML IV BOLUS
INTRAVENOUS | Status: DC | PRN
  Administered 2013-12-03: 170 mg via INTRAVENOUS

## 2013-12-03 MED ORDER — BUDESONIDE 0.25 MG/2ML IN SUSP
0.2500 mg | Freq: Three times a day (TID) | RESPIRATORY_TRACT | Status: DC | PRN
  Filled 2013-12-03: qty 2

## 2013-12-03 MED ORDER — METOCLOPRAMIDE HCL 5 MG/ML IJ SOLN: 5.0000 mg | Freq: Three times a day (TID) | INTRAMUSCULAR | Status: DC | PRN

## 2013-12-03 MED ORDER — PHENOL 1.4 % MT LIQD: 1.0000 | OROMUCOSAL | Status: DC | PRN

## 2013-12-03 MED ORDER — ONDANSETRON HCL 4 MG/2ML IJ SOLN
4.0000 mg | Freq: Four times a day (QID) | INTRAMUSCULAR | Status: DC | PRN
  Administered 2013-12-03: 4 mg via INTRAVENOUS
  Filled 2013-12-03: qty 2

## 2013-12-03 MED ORDER — LIDOCAINE HCL (CARDIAC) 20 MG/ML IV SOLN
INTRAVENOUS | Status: DC | PRN
  Administered 2013-12-03: 60 mg via INTRAVENOUS

## 2013-12-03 MED ORDER — ONDANSETRON HCL 4 MG/2ML IJ SOLN
INTRAMUSCULAR | Status: AC
  Filled 2013-12-03: qty 2

## 2013-12-03 MED ORDER — SODIUM CHLORIDE 0.9 % IR SOLN
Status: DC | PRN
  Administered 2013-12-03: 1000 mL

## 2013-12-03 MED ORDER — METHOCARBAMOL 500 MG PO TABS
ORAL_TABLET | ORAL | Status: AC
  Administered 2013-12-03: 10:00:00
  Filled 2013-12-03: qty 1

## 2013-12-03 MED ORDER — KETOROLAC TROMETHAMINE 15 MG/ML IJ SOLN
7.5000 mg | Freq: Four times a day (QID) | INTRAMUSCULAR | Status: AC
  Administered 2013-12-03 – 2013-12-04 (×3): 7.5 mg via INTRAVENOUS
  Filled 2013-12-03 (×3): qty 1

## 2013-12-03 MED ORDER — ASPIRIN EC 325 MG PO TBEC
325.0000 mg | DELAYED_RELEASE_TABLET | Freq: Every day | ORAL | Status: DC
  Administered 2013-12-04 – 2013-12-05 (×2): 325 mg via ORAL
  Filled 2013-12-03 (×3): qty 1

## 2013-12-03 MED ORDER — LOSARTAN POTASSIUM 50 MG PO TABS: 100.0000 mg | ORAL_TABLET | Freq: Every morning | ORAL | Status: DC

## 2013-12-03 MED ORDER — LIDOCAINE HCL (CARDIAC) 20 MG/ML IV SOLN
INTRAVENOUS | Status: AC
  Filled 2013-12-03: qty 5

## 2013-12-03 MED ORDER — ROCURONIUM BROMIDE 50 MG/5ML IV SOLN
INTRAVENOUS | Status: AC
  Filled 2013-12-03: qty 1

## 2013-12-03 MED ORDER — ALBUTEROL SULFATE (2.5 MG/3ML) 0.083% IN NEBU: 2.5000 mg | INHALATION_SOLUTION | Freq: Four times a day (QID) | RESPIRATORY_TRACT | Status: DC | PRN

## 2013-12-03 MED ORDER — SODIUM CHLORIDE 0.9 % IV SOLN
INTRAVENOUS | Status: DC
  Administered 2013-12-04: 20 mL/h via INTRAVENOUS

## 2013-12-03 MED ORDER — ALBUTEROL SULFATE HFA 108 (90 BASE) MCG/ACT IN AERS
INHALATION_SPRAY | RESPIRATORY_TRACT | Status: AC
  Filled 2013-12-03: qty 6.7

## 2013-12-03 MED ORDER — CEFAZOLIN SODIUM 1-5 GM-% IV SOLN
1.0000 g | Freq: Four times a day (QID) | INTRAVENOUS | Status: AC
  Administered 2013-12-03 – 2013-12-04 (×2): 1 g via INTRAVENOUS
  Filled 2013-12-03 (×3): qty 50

## 2013-12-03 MED ORDER — FENTANYL CITRATE 0.05 MG/ML IJ SOLN
25.0000 ug | INTRAMUSCULAR | Status: DC | PRN
  Administered 2013-12-03 (×4): 25 ug via INTRAVENOUS

## 2013-12-03 MED ORDER — STERILE WATER FOR INJECTION IJ SOLN
INTRAMUSCULAR | Status: AC
  Filled 2013-12-03: qty 10

## 2013-12-03 MED ORDER — METOCLOPRAMIDE HCL 5 MG PO TABS
5.0000 mg | ORAL_TABLET | Freq: Three times a day (TID) | ORAL | Status: DC | PRN
  Filled 2013-12-03: qty 2

## 2013-12-03 MED ORDER — ACETAMINOPHEN 325 MG PO TABS: 650.0000 mg | ORAL_TABLET | Freq: Four times a day (QID) | ORAL | Status: DC | PRN

## 2013-12-03 MED ORDER — FERROUS SULFATE 325 (65 FE) MG PO TABS
325.0000 mg | ORAL_TABLET | Freq: Three times a day (TID) | ORAL | Status: DC
  Administered 2013-12-03 – 2013-12-04 (×2): 325 mg via ORAL
  Filled 2013-12-03 (×9): qty 1

## 2013-12-03 MED ORDER — LOSARTAN POTASSIUM 50 MG PO TABS
100.0000 mg | ORAL_TABLET | Freq: Every day | ORAL | Status: DC
  Administered 2013-12-04 – 2013-12-05 (×2): 100 mg via ORAL
  Filled 2013-12-03 (×2): qty 2

## 2013-12-03 MED ORDER — ONDANSETRON HCL 4 MG PO TABS: 4.0000 mg | ORAL_TABLET | Freq: Four times a day (QID) | ORAL | Status: DC | PRN

## 2013-12-03 MED ORDER — ALBUTEROL SULFATE HFA 108 (90 BASE) MCG/ACT IN AERS
INHALATION_SPRAY | RESPIRATORY_TRACT | Status: DC | PRN
  Administered 2013-12-03: 4 via RESPIRATORY_TRACT
  Administered 2013-12-03: 2 via RESPIRATORY_TRACT

## 2013-12-03 MED ORDER — TRAMADOL HCL 50 MG PO TABS
100.0000 mg | ORAL_TABLET | Freq: Four times a day (QID) | ORAL | Status: DC
  Administered 2013-12-03 – 2013-12-05 (×8): 100 mg via ORAL
  Filled 2013-12-03 (×9): qty 2

## 2013-12-03 MED ORDER — GLYCOPYRROLATE 0.2 MG/ML IJ SOLN
INTRAMUSCULAR | Status: AC
  Filled 2013-12-03: qty 3

## 2013-12-03 MED ORDER — DIPHENHYDRAMINE HCL 12.5 MG/5ML PO ELIX: 12.5000 mg | ORAL_SOLUTION | ORAL | Status: DC | PRN

## 2013-12-03 MED ORDER — ALUM & MAG HYDROXIDE-SIMETH 200-200-20 MG/5ML PO SUSP: 30.0000 mL | ORAL | Status: DC | PRN

## 2013-12-03 MED ORDER — METHOCARBAMOL 100 MG/ML IJ SOLN
500.0000 mg | Freq: Four times a day (QID) | INTRAMUSCULAR | Status: DC | PRN
  Administered 2013-12-03: 500 mg via INTRAVENOUS
  Filled 2013-12-03 (×2): qty 5

## 2013-12-03 MED ORDER — PHENYLEPHRINE HCL 10 MG/ML IJ SOLN
INTRAMUSCULAR | Status: DC | PRN
  Administered 2013-12-03: 40 ug via INTRAVENOUS
  Administered 2013-12-03: 80 ug via INTRAVENOUS
  Administered 2013-12-03: 40 ug via INTRAVENOUS

## 2013-12-03 MED ORDER — EPHEDRINE SULFATE 50 MG/ML IJ SOLN
INTRAMUSCULAR | Status: AC
  Filled 2013-12-03: qty 1

## 2013-12-03 MED ORDER — HYDROMORPHONE HCL PF 1 MG/ML IJ SOLN: 1.0000 mg | INTRAMUSCULAR | Status: DC | PRN

## 2013-12-03 MED ORDER — FENTANYL CITRATE 0.05 MG/ML IJ SOLN
INTRAMUSCULAR | Status: AC
  Filled 2013-12-03: qty 5

## 2013-12-03 MED ORDER — ATORVASTATIN CALCIUM 40 MG PO TABS
40.0000 mg | ORAL_TABLET | Freq: Every day | ORAL | Status: DC
  Administered 2013-12-03 – 2013-12-04 (×2): 40 mg via ORAL
  Filled 2013-12-03 (×3): qty 1

## 2013-12-03 MED ORDER — ACETAMINOPHEN 650 MG RE SUPP: 650.0000 mg | Freq: Four times a day (QID) | RECTAL | Status: DC | PRN

## 2013-12-03 MED ORDER — EPHEDRINE SULFATE 50 MG/ML IJ SOLN
INTRAMUSCULAR | Status: DC | PRN
  Administered 2013-12-03 (×2): 5 mg via INTRAVENOUS

## 2013-12-03 MED ORDER — NEOSTIGMINE METHYLSULFATE 1 MG/ML IJ SOLN
INTRAMUSCULAR | Status: AC
  Filled 2013-12-03: qty 10

## 2013-12-03 MED ORDER — MENTHOL 3 MG MT LOZG: 1.0000 | LOZENGE | OROMUCOSAL | Status: DC | PRN

## 2013-12-03 MED ORDER — PROPOFOL 10 MG/ML IV BOLUS
INTRAVENOUS | Status: AC
  Filled 2013-12-03: qty 20

## 2013-12-03 MED ORDER — LACTATED RINGERS IV SOLN
INTRAVENOUS | Status: DC | PRN
  Administered 2013-12-03 (×2): via INTRAVENOUS

## 2013-12-03 MED ORDER — METHOCARBAMOL 500 MG PO TABS
500.0000 mg | ORAL_TABLET | Freq: Four times a day (QID) | ORAL | Status: DC | PRN
  Administered 2013-12-03: 500 mg via ORAL
  Filled 2013-12-03 (×2): qty 1

## 2013-12-03 SURGICAL SUPPLY — 45 items
BLADE SAW SAG 73X25 THK (BLADE) ×1
BLADE SAW SGTL 73X25 THK (BLADE) ×1 IMPLANT
BLADE SURG 10 STRL SS (BLADE) IMPLANT
BLADE SURG 21 STRL SS (BLADE) ×2 IMPLANT
BRUSH FEMORAL CANAL (MISCELLANEOUS) IMPLANT
CAP POROUS W/METAL ON POLY ×2 IMPLANT
COVER BACK TABLE 24X17X13 BIG (DRAPES) IMPLANT
COVER SURGICAL LIGHT HANDLE (MISCELLANEOUS) ×2 IMPLANT
DRAPE INCISE IOBAN 85X60 (DRAPES) ×2 IMPLANT
DRAPE ORTHO SPLIT 77X108 STRL (DRAPES) ×4
DRAPE SURG ORHT 6 SPLT 77X108 (DRAPES) ×2 IMPLANT
DRAPE U-SHAPE 47X51 STRL (DRAPES) ×2 IMPLANT
DRSG MEPILEX BORDER 4X12 (GAUZE/BANDAGES/DRESSINGS) ×2 IMPLANT
DRSG MEPILEX BORDER 4X8 (GAUZE/BANDAGES/DRESSINGS) IMPLANT
DURAPREP 26ML APPLICATOR (WOUND CARE) ×2 IMPLANT
ELECT BLADE 6.5 EXT (BLADE) IMPLANT
ELECT CAUTERY BLADE 6.4 (BLADE) ×2 IMPLANT
ELECT REM PT RETURN 9FT ADLT (ELECTROSURGICAL) ×2
ELECTRODE REM PT RTRN 9FT ADLT (ELECTROSURGICAL) ×1 IMPLANT
GLOVE BIOGEL PI IND STRL 9 (GLOVE) ×1 IMPLANT
GLOVE BIOGEL PI INDICATOR 9 (GLOVE) ×1
GLOVE SURG ORTHO 9.0 STRL STRW (GLOVE) ×2 IMPLANT
GOWN STRL REUS W/ TWL XL LVL3 (GOWN DISPOSABLE) ×2 IMPLANT
GOWN STRL REUS W/TWL XL LVL3 (GOWN DISPOSABLE) ×4
HANDPIECE INTERPULSE COAX TIP (DISPOSABLE)
KIT BASIN OR (CUSTOM PROCEDURE TRAY) ×2 IMPLANT
KIT ROOM TURNOVER OR (KITS) ×2 IMPLANT
MANIFOLD NEPTUNE II (INSTRUMENTS) ×2 IMPLANT
NS IRRIG 1000ML POUR BTL (IV SOLUTION) ×2 IMPLANT
PACK TOTAL JOINT (CUSTOM PROCEDURE TRAY) ×2 IMPLANT
PAD ARMBOARD 7.5X6 YLW CONV (MISCELLANEOUS) ×4 IMPLANT
PRESSURIZER FEMORAL UNIV (MISCELLANEOUS) IMPLANT
SET HNDPC FAN SPRY TIP SCT (DISPOSABLE) IMPLANT
STAPLER VISISTAT 35W (STAPLE) ×2 IMPLANT
SUT ETHIBOND NAB CT1 #1 30IN (SUTURE) ×2 IMPLANT
SUT VIC AB 0 CT1 27 (SUTURE) ×4
SUT VIC AB 0 CT1 27XBRD ANBCTR (SUTURE) ×2 IMPLANT
SUT VIC AB 1 CTX 36 (SUTURE) ×2
SUT VIC AB 1 CTX36XBRD ANBCTR (SUTURE) ×1 IMPLANT
SUT VIC AB 2-0 CTB1 (SUTURE) ×4 IMPLANT
TOWEL OR 17X24 6PK STRL BLUE (TOWEL DISPOSABLE) ×2 IMPLANT
TOWEL OR 17X26 10 PK STRL BLUE (TOWEL DISPOSABLE) ×2 IMPLANT
TOWER CARTRIDGE SMART MIX (DISPOSABLE) IMPLANT
TRAY FOLEY CATH 16FRSI W/METER (SET/KITS/TRAYS/PACK) IMPLANT
WATER STERILE IRR 1000ML POUR (IV SOLUTION) ×6 IMPLANT

## 2013-12-03 NOTE — Preoperative (Signed)
Beta Blockers   Reason not to administer Beta Blockers:Not Applicable 

## 2013-12-03 NOTE — Op Note (Signed)
OPERATIVE REPORT  DATE OF SURGERY: 12/03/2013  PATIENT:  [name],  73 y.o. female  PRE-OPERATIVE DIAGNOSIS:  Osteoarthritis Left Hip  POST-OPERATIVE DIAGNOSIS:  Osteoarthritis Left Hip  PROCEDURE:  Procedure(s): TOTAL HIP ARTHROPLASTY Zimmer components. Size 50 mm acetabulum. 0 polyethylene liner. Size 9 femur. Anteverted +0 neck.  SURGEON:  Surgeon(s): Nadara MustardMarcus V Alaynna Kerwood, MD  ANESTHESIA:   general  EBL:  Minimal ML  SPECIMEN:  No Specimen  TOURNIQUET:  * No tourniquets in log *  PROCEDURE DETAILS: Patient is a 73 year old woman with progressive osteoarthritis of her left hip. She has pain with activities of daily living has failed conservative care and presents at this time for surgical intervention. Risks and benefits were discussed including infection neurovascular injury pain DVT pulmonary embolus dislocation and need for additional surgery. Patient states she understands and wishes to proceed at this time. Description of procedure patient was brought to the operating room and underwent a general anesthetic. After adequate levels of anesthesia were obtained patient was placed in the right lateral to this position with the left side up and the left lower extremity was prepped using DuraPrep draped into a sterile field. Collier Flowersoban was used to cover all exposed skin. A posterior lateral incision was made this was carried down to the tensor fascia lata which was split. The piriformis short external rotators and capsule were incised off the femoral neck and everted. The hip was dislocated the femoral neck cut was made 1 cm proximal to the calcar. The acetabulum was sequentially reamed to 50 mm and a 50 mm cup was placed 45 of abduction and 20 of anteversion. The 0 polyethylene liner was placed. The femur was sequentially broached to a size 9 the size 9 femoral stem was placed and trial neck components were placed until the hip was stable. The +0 anteverted neck was stable. This was  reamed reduced with the final hip the hip was stable with full abduction internal rotation of 70 and the hip was stable. The wound is irrigated throughout the case. The capsule short external rotators and performed as were reapproximated using #1 Ethibond. The tensor fascia lata was closed using #1 Vicryl. Subcutaneous was closed using 0 Vicryl skin was closed using staples. The wound was covered with a Mepilex dressing. Patient was extubated taken to the PACU in stable condition.  PLAN OF CARE: Admit to inpatient   PATIENT DISPOSITION:  PACU - hemodynamically stable.   Nadara MustardUDA,Najmo Pardue V, MD 12/03/2013 9:39 AM

## 2013-12-03 NOTE — Progress Notes (Signed)
Orthopedic Tech Progress Note Patient Details:  Cristino Martesancy L Bednarczyk 12-02-1940 [id number]  Ortho Devices Ortho Device/Splint Location: put ohf on bed Ortho Device/Splint Interventions: Ordered;Application   Jennye MoccasinHughes, Khamya Topp Craig 12/03/2013, 6:05 PM

## 2013-12-03 NOTE — Anesthesia Postprocedure Evaluation (Signed)
  Anesthesia Post-op Note  Patient: [name]  Procedure(s) Performed: Procedure(s) with comments: TOTAL HIP ARTHROPLASTY (Left) - Left Total Hip Arthroplasty  Patient Location: PACU  Anesthesia Type:General  Level of Consciousness: awake  Airway and Oxygen Therapy: Patient Spontanous Breathing  Post-op Pain: mild  Post-op Assessment: Post-op Vital signs reviewed  Post-op Vital Signs: Reviewed  Complications: No apparent anesthesia complications

## 2013-12-03 NOTE — Transfer of Care (Signed)
Immediate Anesthesia Transfer of Care Note  Patient: [name]  Procedure(s) Performed: Procedure(s) with comments: TOTAL HIP ARTHROPLASTY (Left) - Left Total Hip Arthroplasty  Patient Location: PACU  Anesthesia Type:General  Level of Consciousness: awake and alert   Airway & Oxygen Therapy: Patient Spontanous Breathing and Patient connected to nasal cannula oxygen  Post-op Assessment: Report given to PACU RN, Post -op Vital signs reviewed and stable and Patient moving all extremities X 4  Post vital signs: Reviewed and stable  Complications: No apparent anesthesia complications

## 2013-12-03 NOTE — Progress Notes (Signed)
Utilization review completed.  

## 2013-12-03 NOTE — H&P (Signed)
TOTAL HIP ADMISSION H&P  Patient is admitted for left total hip arthroplasty.  Subjective:  Chief Complaint: left hip pain  HPI: Julia MartesNancy L Glaus, 73 y.o. female, has a history of pain and functional disability in the left hip(s) due to arthritis and patient has failed non-surgical conservative treatments for greater than 12 weeks to include NSAID's and/or analgesics, use of assistive devices, weight reduction as appropriate and activity modification.  Onset of symptoms was gradual starting 8 years ago with gradually worsening course since that time.The patient noted no past surgery on the left hip(s).  Patient currently rates pain in the left hip at 8 out of 10 with activity. Patient has night pain, worsening of pain with activity and weight bearing, trendelenberg gait, pain that interfers with activities of daily living and pain with passive range of motion. Patient has evidence of subchondral cysts, subchondral sclerosis and joint space narrowing by imaging studies. This condition presents safety issues increasing the risk of falls. This patient has had Progressive osteoarthritis.  There is no current active infection.  Patient Active Problem List   Diagnosis Date Noted  . DOE (dyspnea on exertion) 07/16/2013  . Chest pressure 07/16/2013  . Unspecified anemia 09/14/2009  . GERD 09/14/2009  . VITAMIN D DEFICIENCY 09/02/2009  . HYPERLIPIDEMIA 09/02/2009  . HYPERTENSION 09/02/2009  . ALLERGIC RHINITIS 09/02/2009  . ASTHMA 09/02/2009  . REFLUX ESOPHAGITIS 09/02/2009  . OSTEOPENIA 09/02/2009  . 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 Asthma   . Arthritis   . Hypertension     pcp   [employer]ning     . GERD (gastroesophageal reflux disease)   . History of bronchitis   . Pneumonia     as a child    Past Surgical History  Procedure Laterality Date  . Rotator cuff repair    . Abdominal hysterectomy    . 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 Total knee arthroplasty Right 12/04/2012    Procedure: TOTAL KNEE ARTHROPLASTY;  Surgeon: Nadara MustardMarcus V Ilma Achee, MD;  Location: [facility] OR;  Service: Orthopedics;  Laterality: Right;  Right Total Knee Arthroplasty  . Joint replacement    . Bladder tack    . Colonoscopy      Prescriptions prior to admission  Medication Sig Dispense Refill  . budesonide (PULMICORT) 0.25 MG/2ML nebulizer solution Take 0.25 mg by nebulization 3 (three) times daily as needed (asthma, shortness of breath).       . calcium carbonate (TUMS EX) 750 MG chewable tablet Chew 1 tablet by mouth daily as needed.       . Cholecalciferol (VITAMIN D-3) 1000 UNITS CAPS Take 1,000 Units by mouth 2 (two) times daily.       . hydrochlorothiazide (MICROZIDE) 12.5 MG capsule Take 12.5 mg by mouth every morning.       Marland Kitchen. losartan (COZAAR) 100 MG tablet Take 100 mg by mouth every morning.       . naproxen sodium (ALEVE) 220 MG tablet Take 220 mg by mouth 2 (two) times daily with a meal.      . simvastatin (ZOCOR) 80 MG tablet Take 80 mg by mouth at bedtime.      Marland Kitchen. albuterol (PROVENTIL HFA;VENTOLIN HFA) 108 (90 BASE) MCG/ACT inhaler Inhale 2 puffs into the lungs every 6 (six) hours as needed for wheezing.       Allergies  Allergen Reactions  . Biaxin [Clarithromycin] Other (See Comments)  Elevated heart rate  . Oxycontin [Oxycodone]     Lips swell  . Vicodin [Hydrocodone-Acetaminophen]     swelling    History  Substance Use Topics  . Smoking status: Never Smoker   . Smokeless tobacco: Never Used  . Alcohol Use: No    Family History  Problem Relation Age of Onset  . Hypertension Mother   . Heart disease Mother   . Transient ischemic attack Mother   . CAD Father   . Cancer - Ovarian Sister      Review of Systems  All other systems reviewed and are negative.    Objective:  Physical Exam  Vital signs in last 24 hours:    Labs:   Estimated body mass index is 27.74 kg/(m^2) as calculated from the  following:   Height as of 09/25/13: 5\' 5"  (1.651 m).   Weight as of 09/25/13: 75.615 kg (166 lb 11.2 oz).   Imaging Review Plain radiographs demonstrate moderate degenerative joint disease of the left hip(s). The bone quality appears to be adequate for age and reported activity level.  Assessment/Plan:  End stage arthritis, left hip(s)  The patient history, physical examination, clinical judgement of the provider and imaging studies are consistent with end stage degenerative joint disease of the left hip(s) and total hip arthroplasty is deemed medically necessary. The treatment options including medical management, injection therapy, arthroscopy and arthroplasty were discussed at length. The risks and benefits of total hip arthroplasty were presented and reviewed. The risks due to aseptic loosening, infection, stiffness, dislocation/subluxation,  thromboembolic complications and other imponderables were discussed.  The patient acknowledged the explanation, agreed to proceed with the plan and consent was signed. Patient is being admitted for inpatient treatment for surgery, pain control, PT, OT, prophylactic antibiotics, VTE prophylaxis, progressive ambulation and ADL's and discharge planning.The patient is planning to be discharged home with home health services

## 2013-12-03 NOTE — Anesthesia Procedure Notes (Signed)
Procedure Name: Intubation Date/Time: 12/03/2013 8:44 AM Performed by: Vita BarleyURNER, Greta Yung E Pre-anesthesia Checklist: Patient identified, Emergency Drugs available, Suction available and Patient being monitored Patient Re-evaluated:Patient Re-evaluated prior to inductionOxygen Delivery Method: Circle system utilized Preoxygenation: Pre-oxygenation with 100% oxygen Intubation Type: IV induction Ventilation: Mask ventilation without difficulty and Oral airway inserted - appropriate to patient size Laryngoscope Size: Hyacinth MeekerMiller and 2 Grade View: Grade I Tube type: Oral Tube size: 7.5 mm Airway Equipment and Method: Stylet and Oral airway Placement Confirmation: ETT inserted through vocal cords under direct vision,  positive ETCO2 and breath sounds checked- equal and bilateral Secured at: 22 cm Tube secured with: Tape Dental Injury: Teeth and Oropharynx as per pre-operative assessment

## 2013-12-03 NOTE — Anesthesia Preprocedure Evaluation (Addendum)
Anesthesia Evaluation  Patient identified by MRN, date of birth, ID band Patient awake    Reviewed: Allergy & Precautions, H&P , NPO status , Patient's Chart, lab work & pertinent test results  Airway Mallampati: II TM Distance: <3 FB Neck ROM: Full    Dental  (+) Dental Advisory Given, Teeth Intact, Missing   Pulmonary shortness of breath and with exertion, asthma , pneumonia -,  breath sounds clear to auscultation        Cardiovascular hypertension, Pt. on medications + DOE Rhythm:Regular Rate:Normal     Neuro/Psych    GI/Hepatic Neg liver ROS, GERD-  Medicated,  Endo/Other  negative endocrine ROS  Renal/GU negative Renal ROS     Musculoskeletal  (+) Arthritis -,   Abdominal   Peds  Hematology   Anesthesia Other Findings   Reproductive/Obstetrics                        Anesthesia Physical Anesthesia Plan  ASA: III  Anesthesia Plan: General   Post-op Pain Management:    Induction: Intravenous  Airway Management Planned: Oral ETT  Additional Equipment:   Intra-op Plan:   Post-operative Plan: Extubation in OR  Informed Consent: I have reviewed the patients History and Physical, chart, labs and discussed the procedure including the risks, benefits and alternatives for the proposed anesthesia with the patient or authorized representative who has indicated his/her understanding and acceptance.   Dental advisory given  Plan Discussed with: CRNA, Anesthesiologist and Surgeon  Anesthesia Plan Comments:        Anesthesia Quick Evaluation

## 2013-12-03 NOTE — Evaluation (Signed)
Physical Therapy Evaluation Patient Details Name: [name] MRN: [id number] DOB: [birth year] Today's Date: 12/03/2013 Time: 7829-5621 PT Time Calculation (min): 16 min  PT Assessment / Plan / Recommendation History of Present Illness  Pt is a 73 y/o female admitted s/p L THA posterior approach. Waiting on WB status from MD.   Clinical Impression  This patient presents with acute pain and decreased functional independence following the above mentioned procedure. At the time of PT eval, pt limited to SPT as continue to await WB status clarification from MD - RN aware. This patient is appropriate for skilled PT interventions to address functional limitations, improve safety and independence with functional mobility, and return to PLOF.     PT Assessment  Patient needs continued PT services    Follow Up Recommendations  Home health PT    Does the patient have the potential to tolerate intense rehabilitation      Barriers to Discharge        Equipment Recommendations       Recommendations for Other Services     Frequency 7X/week    Precautions / Restrictions Precautions Precautions: Fall;Posterior Hip Precaution Booklet Issued: Yes (comment) Precaution Comments: Discussed 3/3 hip precautions with pt and husband.  Restrictions Other Position/Activity Restrictions: Waiting for clarification of WB status from MD.   Pertinent Vitals/Pain Pt reports minimal pain throughout session.       Mobility  Bed Mobility Overal bed mobility: Needs Assistance Bed Mobility: Supine to Sit Supine to sit: Min assist General bed mobility comments: Increased time to complete transfer to EOB, however required assist for movement and support of LLE. VC's for sequencing and technique.  Transfers Overall transfer level: Needs assistance Equipment used: Rolling walker (2 wheeled) Transfers: Sit to/from [employer] Sit to Stand: Min assist Stand pivot transfers: Min  guard General transfer comment: VC's for sequencing and safety awareness with the RW. Also cued for hand placement on seated surface for safety.     Exercises Total Joint Exercises Ankle Circles/Pumps: 10 reps Quad Sets: 10 reps   PT Diagnosis: Difficulty walking;Acute pain  PT Problem List: Decreased strength;Decreased range of motion;Decreased activity tolerance;Decreased balance;Decreased mobility;Decreased knowledge of use of DME;Decreased safety awareness;Decreased knowledge of precautions;Pain PT Treatment Interventions: DME instruction;Gait training;Stair training;Functional mobility training;Therapeutic activities;Therapeutic exercise;Neuromuscular re-education;Patient/family education     PT Goals(Current goals can be found in the care plan section) Acute Rehab PT Goals Patient Stated Goal: To return home with husband PT Goal Formulation: With patient/family Time For Goal Achievement: 12/10/13 Potential to Achieve Goals: Good  Visit Information  Last PT Received On: 12/03/13 Assistance Needed: +1 History of Present Illness: Pt is a 73 y/o female admitted s/p L THA posterior approach. Waiting on WB status from MD.        Prior Functioning  Home Living Family/patient expects to be discharged to:: Private residence Living Arrangements: Spouse/significant other Available Help at Discharge: Family;Available 24 hours/day Type of Home: House Home Access: Ramped entrance Home Layout: One level Home Equipment: Walker - 2 wheels;Cane - single point;Bedside commode Prior Function Level of Independence: Independent with assistive device(s) Comments: Using cane all the time. Still driving, doing grocery shopping, modified cleaning, and cooking Communication Communication: No difficulties Dominant Hand: Right    Cognition  Cognition Arousal/Alertness: Awake/alert Behavior During Therapy: WFL for tasks assessed/performed Overall Cognitive Status: Within Functional Limits for  tasks assessed    Extremity/Trunk Assessment Upper Extremity Assessment Upper Extremity Assessment: Defer to OT evaluation Lower Extremity Assessment Lower  Extremity Assessment: LLE deficits/detail LLE Deficits / Details: Decreased strength and AROM consistent with THA. LLE: Unable to fully assess due to pain Cervical / Trunk Assessment Cervical / Trunk Assessment: Kyphotic   Balance Balance Overall balance assessment: Needs assistance Sitting-balance support: Feet supported;Bilateral upper extremity supported Sitting balance-Leahy Scale: Fair Standing balance support: Bilateral upper extremity supported Standing balance-Leahy Scale: Poor  End of Session PT - End of Session Equipment Utilized During Treatment: Gait belt Activity Tolerance: Patient tolerated treatment well Patient left: in chair;with call bell/phone within reach;with family/visitor present Nurse Communication: Mobility status  GP     Ruthann CancerHamilton, Traniyah Hallett 12/03/2013, 4:55 PM  Ruthann CancerLaura Hamilton, PT, DPT Acute Rehabilitation Services Pager: [phone]

## 2013-12-03 NOTE — Progress Notes (Signed)
Patient only voided 50 ml since surgery. Bladder scan over 400 ml. Dr. August Saucerean paged and order received. Reported to oncoming nurse.

## 2013-12-04 ENCOUNTER — Encounter (HOSPITAL_COMMUNITY): Payer: Self-pay | Admitting: General Practice

## 2013-12-04 LAB — BASIC METABOLIC PANEL
BUN: 22 mg/dL (ref 6–23)
CO2: 27 mEq/L (ref 19–32)
Calcium: 8.3 mg/dL — ABNORMAL LOW (ref 8.4–10.5)
Chloride: 96 mEq/L (ref 96–112)
Creatinine, Ser: 0.97 mg/dL (ref 0.50–1.10)
GFR calc Af Amer: 65 mL/min — ABNORMAL LOW (ref >90)
GFR calc non Af Amer: 56 mL/min — ABNORMAL LOW (ref >90)
Glucose, Bld: 155 mg/dL — ABNORMAL HIGH (ref 70–99)
Potassium: 4 mEq/L (ref 3.7–5.3)
Sodium: 135 mEq/L — ABNORMAL LOW (ref 137–147)

## 2013-12-04 LAB — CBC
HCT: 25.8 % — ABNORMAL LOW (ref 36.0–46.0)
Hemoglobin: 8.7 g/dL — ABNORMAL LOW (ref 12.0–15.0)
MCH: 28.9 pg (ref 26.0–34.0)
MCHC: 33.7 g/dL (ref 30.0–36.0)
MCV: 85.7 fL (ref 78.0–100.0)
Platelets: 230 10*3/uL (ref 150–400)
RBC: 3.01 MIL/uL — ABNORMAL LOW (ref 3.87–5.11)
RDW: 14.2 % (ref 11.5–15.5)
WBC: 10.9 10*3/uL — ABNORMAL HIGH (ref 4.0–10.5)

## 2013-12-04 NOTE — Care Management Note (Signed)
CARE MANAGEMENT NOTE 12/04/2013  Patient:  [name],[name]   Account Number:  [id number]  Date Initiated:  12/04/2013  Documentation initiated by:  Vance PeperBRADY,Oren Barella  Subjective/Objective Assessment:   73 yr old female s/p left total knee arthroplasty.     Action/Plan:   Case manager spoke with patient concerning home health and DME needs at discharge. Choice offered. Patient states she has used [facility] in past and will now. [facility]   Anticipated DC Date:  12/05/2013   Anticipated DC Plan:  HOME W HOME HEALTH SERVICES      DC Planning Services  CM consult      [facility]  HOME HEALTH  DURABLE MEDICAL EQUIPMENT   Choice offered to / List presented to:  C-1 Patient        HH arranged  HH-2 PT      [facility]  [facility]   Status of service:  Completed, signed off Medicare Important Message given?   (If response is "NO", the following Medicare IM given date fields will be blank) Date Medicare IM given:   Date Additional Medicare IM given:    Discharge Disposition:  HOME W HOME HEALTH SERVICES  Per UR Regulation:    If discussed at Long Length of Stay Meetings, dates discussed:    Comments:  12/04/13 3:37pm Vance PeperSusan Zedric Deroy, RN BSN Case manager [phone] patient has rolling walker, 3in1 and a cane at home. Will have family support at discharge.

## 2013-12-04 NOTE — Evaluation (Signed)
Occupational Therapy Evaluation Patient Details Name: [name] MRN: [id number] DOB: 15-Dec-1940 Today's Date: 12/04/2013 Time: [id number] OT Time Calculation (min): 20 min  OT Assessment / Plan / Recommendation History of present illness Pt is a 73 y/o female admitted s/p L THA posterior approach.   Clinical Impression   Pt requires minimum assistance for toilet transfer [facility].  She is aware of adaptive equipment for LB ADL, but plans to rely on her husband's assist.  Pt reports feeling weak, limiting ability to perform standing activities for grooming.  Anticipate pt will progress well and be able to discharge home with her supportive husband.  Will follow to address toileting, shower transfers, and standing activities at the sink.    OT Assessment  Patient needs continued OT Services    Follow Up Recommendations  No OT follow up;Supervision/Assistance - 24 hour    Barriers to Discharge      Equipment Recommendations  None recommended by OT    Recommendations for Other Services    Frequency  Min 2X/week    Precautions / Restrictions Precautions Precautions: Fall;Posterior Hip Precaution Comments: educated pt in hip precautions related to ADL Restrictions Weight Bearing Restrictions: Yes LLE Weight Bearing: Weight bearing as tolerated Other Position/Activity Restrictions: WBAT on L   Pertinent Vitals/Pain 100% 02 on 2L, HR 79, no pain reported   ADL  Eating/Feeding: Independent Where Assessed - Eating/Feeding: Chair Grooming: Wash/dry hands;Set up Where Assessed - Grooming: Unsupported sitting Upper Body Bathing: Set up Where Assessed - Upper Body Bathing: Unsupported sitting Lower Body Bathing: Maximal assistance Where Assessed - Lower Body Bathing: Unsupported sitting;Supported sit to stand Upper Body Dressing: Set up Where Assessed - Upper Body Dressing: Unsupported sitting Lower Body Dressing: Maximal assistance Where Assessed - Lower Body Dressing:  Unsupported sitting;Supported sit to stand Toilet Transfer: Minimal assistance Toilet Transfer Method: Sit to stand Toilet Transfer Equipment: Bedside commode Toileting - Clothing Manipulation and Hygiene: Minimal assistance Where Assessed - Engineer, miningToileting Clothing Manipulation and Hygiene: Sit to stand from 3-in-1 or toilet Equipment Used: Rolling walker;Gait belt ADL Comments: Pt is familiar with post hip precautions from husband's previous hip replacements and pt has had a R TKA.  She will rely on her husband for LB ADL, did not express interest in using AE.    OT Diagnosis: Generalized weakness  OT Problem List: Decreased strength;Decreased activity tolerance;Impaired balance (sitting and/or standing);Obesity OT Treatment Interventions: Self-care/ADL training;DME and/or AE instruction;Patient/family education;Balance training   OT Goals(Current goals can be found in the care plan section) Acute Rehab OT Goals Patient Stated Goal: To return home with husband OT Goal Formulation: With patient Time For Goal Achievement: 12/11/13 Potential to Achieve Goals: Good ADL Goals Pt Will Perform Grooming: with supervision;standing (2 activities) Pt Will Transfer to Toilet: with supervision;ambulating;bedside commode (over toilet) Pt Will Perform Toileting - Clothing Manipulation and hygiene: with supervision;sit to/from stand Pt Will Perform Tub/Shower Transfer: with supervision;ambulating;3 in 1;rolling walker;Shower transfer Additional ADL Goal #1: Pt will state and adhere to posterior hip precautions during mobility and ADL independently.  Visit Information  Last OT Received On: 12/04/13 Assistance Needed: +1 History of Present Illness: Pt is a 73 y/o female admitted s/p L THA posterior approach. Waiting on WB status from MD.        Prior Functioning     Home Living Family/patient expects to be discharged to:: Private residence Living Arrangements: Spouse/significant other Available Help  at Discharge: Family;Available 24 hours/day Type of Home: House Home Access: Ramped entrance  Home Layout: One level Home Equipment: Walker - 2 wheels;Cane - single point;Bedside commode Prior Function Level of Independence: Independent with assistive device(s) Comments: Using cane all the time. Still driving, doing grocery shopping, modified cleaning, and cooking Communication Communication: No difficulties Dominant Hand: Right         Vision/Perception Vision - History Baseline Vision: Wears glasses all the time   Cognition  Cognition Arousal/Alertness: Awake/alert Behavior During Therapy: WFL for tasks assessed/performed Overall Cognitive Status: Within Functional Limits for tasks assessed    Extremity/Trunk Assessment Upper Extremity Assessment Upper Extremity Assessment: Overall WFL for tasks assessed Lower Extremity Assessment Lower Extremity Assessment: Defer to PT evaluation     Mobility Bed Mobility General bed mobility comments: Pt received sitting up in recliner. Transfers Overall transfer level: Needs assistance Equipment used: Rolling walker (2 wheeled) Transfers: Sit to/from Stand Sit to Stand: Min assist General transfer comment: VC's for hand placement on seated surface for safety, as well as for LLE extension to maintain hip precautions.      Exercise    Balance Balance Overall balance assessment: Needs assistance Sitting-balance support: Feet supported Sitting balance-Leahy Scale: Fair Standing balance support: Bilateral upper extremity supported Standing balance-Leahy Scale: Poor   End of Session OT - End of Session Activity Tolerance: Patient tolerated treatment well Patient left: in chair;with call bell/phone within reach  GO     Evern Bio 12/04/2013, 11:47 AM [phone]

## 2013-12-04 NOTE — Progress Notes (Signed)
Physical Therapy Treatment Patient Details Name: [name] MRN: [id number] DOB: [birth year] Today's Date: 12/04/2013 Time: 4098-1191 PT Time Calculation (min): 32 min  PT Assessment / Plan / Recommendation  History of Present Illness Pt is a 73 y/o female admitted s/p L THA posterior approach. WB status confirmed as WBAT.    PT Comments   Pt progressing towards physical therapy goals. Pt able to ambulate into bathroom to void, and perform hygiene with min assist for balance. Improving slowly with gait training and limited by decreased tolerance for functional activity.   Follow Up Recommendations  Home health PT     Does the patient have the potential to tolerate intense rehabilitation     Barriers to Discharge        Equipment Recommendations  None recommended by PT    Recommendations for Other Services    Frequency 7X/week   Progress towards PT Goals Progress towards PT goals: Progressing toward goals  Plan Current plan remains appropriate    Precautions / Restrictions Precautions Precautions: Fall;Posterior Hip Precaution Comments: Discussed 3/3 hip precautions with pt Restrictions Weight Bearing Restrictions: Yes LLE Weight Bearing: Weight bearing as tolerated   Pertinent Vitals/Pain Pt reports minimal pain throughout session.    Mobility  Bed Mobility Overal bed mobility: Needs Assistance Bed Mobility: Supine to Sit Supine to sit: Min assist General bed mobility comments: Increased time required to reach EOB. Pt required assist to support LLE as she scoots to EOB.  Transfers Overall transfer level: Needs assistance Equipment used: Rolling walker (2 wheeled) Transfers: Sit to/from Stand Sit to Stand: Min assist General transfer comment: VC's for hand placement on seated surface for safety, as well as for LLE extension to maintain hip precautions.  Ambulation/Gait Ambulation/Gait assistance: Min guard Ambulation Distance (Feet): 50 Feet Assistive device:  Rolling walker (2 wheeled) Gait Pattern/deviations: Step-to pattern;Step-through pattern;Decreased stride length Gait velocity: Decreased Gait velocity interpretation: Below normal speed for age/gender General Gait Details: Smoother ambulation with improved technique. Continues to be limited by fatigue with 3 standing rest breaks required.     Exercises Total Joint Exercises Ankle Circles/Pumps: 15 reps Quad Sets: 15 reps Short Arc Quad: 15 reps Heel Slides: 15 reps Hip ABduction/ADduction: 15 reps   PT Diagnosis:    PT Problem List:   PT Treatment Interventions:     PT Goals (current goals can now be found in the care plan section) Acute Rehab PT Goals Patient Stated Goal: To return home with husband PT Goal Formulation: With patient/family Time For Goal Achievement: 12/10/13 Potential to Achieve Goals: Good  Visit Information  Last PT Received On: 12/04/13 Assistance Needed: +1 History of Present Illness: Pt is a 73 y/o female admitted s/p L THA posterior approach. Waiting on WB status from MD.     Subjective Data  Subjective: Pt agreeable to OOB Patient Stated Goal: To return home with husband   Cognition  Cognition Arousal/Alertness: Awake/alert Behavior During Therapy: WFL for tasks assessed/performed Overall Cognitive Status: Within Functional Limits for tasks assessed    Balance  Balance Overall balance assessment: Needs assistance Sitting-balance support: No upper extremity supported Sitting balance-Leahy Scale: Fair Standing balance support: Bilateral upper extremity supported Standing balance-Leahy Scale: Poor  End of Session PT - End of Session Equipment Utilized During Treatment: Gait belt Activity Tolerance: Patient tolerated treatment well Patient left: in chair;with call bell/phone within reach;with family/visitor present Nurse Communication: Mobility status   GP     [name] 12/04/2013, 4:10 PM  [name],  PT, DPT Acute  Rehabilitation Services Pager: [phone]

## 2013-12-04 NOTE — Progress Notes (Signed)
Physical Therapy Treatment Patient Details Name: [name] MRN: [id number] DOB: [birth year] Today's Date: 12/04/2013 Time: 1012-1032 PT Time Calculation (min): 20 min  PT Assessment / Plan / Recommendation  History of Present Illness Pt is a 73 y/o female admitted s/p L THA posterior approach. Waiting on WB status from MD.    PT Comments   Pt progressing towards physical therapy goals. Was able to ambulate to hallway but was limited by lightheadedness. Pt states she feels weak as she still has not had solid food yet. Will continue to follow.   Follow Up Recommendations  Home health PT     Does the patient have the potential to tolerate intense rehabilitation     Barriers to Discharge        Equipment Recommendations  None recommended by PT    Recommendations for Other Services    Frequency 7X/week   Progress towards PT Goals Progress towards PT goals: Progressing toward goals  Plan Current plan remains appropriate    Precautions / Restrictions Precautions Precautions: Fall;Posterior Hip Precaution Comments: Discussed 3/3 hip precautions with pt Restrictions Weight Bearing Restrictions: Yes LLE Weight Bearing: Weight bearing as tolerated   Pertinent Vitals/Pain Pt reports minimal pain throughout session.  "I'm not sure how it's supposed to feel but for now it's not bad."    Mobility  Bed Mobility General bed mobility comments: Pt received sitting up in recliner. Pt states she had been sitting up for about 10 minutes prior to PT arrival.  Transfers Overall transfer level: Needs assistance Equipment used: Rolling walker (2 wheeled) Transfers: Sit to/from Stand Sit to Stand: Min assist General transfer comment: VC's for hand placement on seated surface for safety, as well as for LLE extension to maintain hip precautions.  Ambulation/Gait Ambulation/Gait assistance: Min guard Ambulation Distance (Feet): 40 Feet Assistive device: Rolling walker (2 wheeled) Gait  Pattern/deviations: Step-to pattern;Decreased stride length;Trunk flexed Gait velocity: Decreased Gait velocity interpretation: Below normal speed for age/gender General Gait Details: Distance limited by lightheadedness and weakness per pt report. VC's given for sequencing and safety awareness, and pt took 3 standing rest breaks throughout gait training.     Exercises Total Joint Exercises Ankle Circles/Pumps: 10 reps Quad Sets: 15 reps Hip ABduction/ADduction: 10 reps   PT Diagnosis:    PT Problem List:   PT Treatment Interventions:     PT Goals (current goals can now be found in the care plan section) Acute Rehab PT Goals Patient Stated Goal: To return home with husband PT Goal Formulation: With patient/family Time For Goal Achievement: 12/10/13 Potential to Achieve Goals: Good  Visit Information  Last PT Received On: 12/04/13 Assistance Needed: +1 History of Present Illness: Pt is a 73 y/o female admitted s/p L THA posterior approach. Waiting on WB status from MD.     Subjective Data  Subjective: "I feel dizzy." Patient Stated Goal: To return home with husband   Cognition  Cognition Arousal/Alertness: Awake/alert Behavior During Therapy: WFL for tasks assessed/performed Overall Cognitive Status: Within Functional Limits for tasks assessed    Balance  Balance Overall balance assessment: Needs assistance Sitting-balance support: Feet supported Sitting balance-Leahy Scale: Fair Standing balance support: Bilateral upper extremity supported Standing balance-Leahy Scale: Poor  End of Session PT - End of Session Equipment Utilized During Treatment: Gait belt Activity Tolerance: Patient tolerated treatment well Patient left: in chair;with call bell/phone within reach;with family/visitor present Nurse Communication: Mobility status   GP     Ruthann CancerHamilton, [place] 12/04/2013, 10:40 AM  Jolyn Lent, PT, DPT Acute Rehabilitation Services Pager: [phone]

## 2013-12-04 NOTE — Progress Notes (Signed)
Patient ID: Julia MartesNancy L Mullen, female   DOB: Jun 11, 1941, 73 y.o.   MRN: [id number] Postoperative day 1 left total hip arthroplasty. Patient was out of bed to a chair yesterday. Pain controlled well with Ultram and Toradol.  catheterization last night for urinary retention patient may require Foley placement if she still has urinary retention. Anticipate discharge on Friday..Marland Kitchen

## 2013-12-05 ENCOUNTER — Encounter (HOSPITAL_COMMUNITY): Payer: Self-pay | Admitting: Orthopedic Surgery

## 2013-12-05 LAB — BASIC METABOLIC PANEL
BUN: 21 mg/dL (ref 6–23)
CO2: 27 mEq/L (ref 19–32)
Calcium: 9 mg/dL (ref 8.4–10.5)
Chloride: 95 mEq/L — ABNORMAL LOW (ref 96–112)
Creatinine, Ser: 1.03 mg/dL (ref 0.50–1.10)
GFR calc Af Amer: 60 mL/min — ABNORMAL LOW (ref >90)
GFR calc non Af Amer: 52 mL/min — ABNORMAL LOW (ref >90)
Glucose, Bld: 158 mg/dL — ABNORMAL HIGH (ref 70–99)
Potassium: 4.6 mEq/L (ref 3.7–5.3)
Sodium: 134 mEq/L — ABNORMAL LOW (ref 137–147)

## 2013-12-05 LAB — CBC
HCT: 27.1 % — ABNORMAL LOW (ref 36.0–46.0)
Hemoglobin: 9.3 g/dL — ABNORMAL LOW (ref 12.0–15.0)
MCH: 29.3 pg (ref 26.0–34.0)
MCHC: 34.3 g/dL (ref 30.0–36.0)
MCV: 85.5 fL (ref 78.0–100.0)
Platelets: 247 10*3/uL (ref 150–400)
RBC: 3.17 MIL/uL — ABNORMAL LOW (ref 3.87–5.11)
RDW: 14.1 % (ref 11.5–15.5)
WBC: 13.5 10*3/uL — ABNORMAL HIGH (ref 4.0–10.5)

## 2013-12-05 MED ORDER — TRAMADOL HCL 50 MG PO TABS: 50.0000 mg | ORAL_TABLET | Freq: Four times a day (QID) | ORAL | Status: DC | PRN

## 2013-12-05 NOTE — Progress Notes (Signed)
Physical Therapy Treatment Patient Details Name: [name] MRN: [id number] DOB: 1941/02/13 Today's Date: 12/05/2013 Time: 4098-11911315-1344 PT Time Calculation (min): 29 min  PT Assessment / Plan / Recommendation  History of Present Illness Pt is a 73 y/o female admitted s/p L THA posterior approach. Waiting on WB status from MD.    PT Comments   Pt progressing towards physical therapy goals. Discussed HEP and car transfer as pt is to d/c this afternoon. Pt and husband report understanding of safety and precautions and have no further questions.   Follow Up Recommendations  Home health PT     Does the patient have the potential to tolerate intense rehabilitation     Barriers to Discharge        Equipment Recommendations  None recommended by PT    Recommendations for Other Services    Frequency 7X/week   Progress towards PT Goals Progress towards PT goals: Progressing toward goals  Plan Current plan remains appropriate    Precautions / Restrictions Precautions Precautions: Fall;Posterior Hip Precaution Comments: Discussed 3/3 hip precautions with pt Restrictions Weight Bearing Restrictions: Yes LLE Weight Bearing: Weight bearing as tolerated   Pertinent Vitals/Pain Pt reports minimal pain throughout session.     Mobility  Bed Mobility Overal bed mobility: Needs Assistance Bed Mobility: Supine to Sit;Sit to Supine Supine to sit: Supervision General bed mobility comments: Pt received already seated in bathroom.  Transfers Overall transfer level: Needs assistance Equipment used: Rolling walker (2 wheeled) Transfers: Sit to/from Stand Sit to Stand: Min guard General transfer comment: Pt demonstrated proper hand placement and safety awareness. Increased time needed to power-up to full stand.  Ambulation/Gait Ambulation/Gait assistance: Min guard Ambulation Distance (Feet): 25 Feet Assistive device: Rolling walker (2 wheeled) Gait Pattern/deviations: Step-through  pattern;Decreased stride length Gait velocity: Decreased Gait velocity interpretation: Below normal speed for age/gender General Gait Details: Increased distance this session. Pt complains of sporatic lightheadedness and takes frequent rest breaks throughout gait training.  Stairs:  (Pt has ramp to enter)    Exercises     PT Diagnosis:    PT Problem List:   PT Treatment Interventions:     PT Goals (current goals can now be found in the care plan section) Acute Rehab PT Goals Patient Stated Goal: To return home with husband PT Goal Formulation: With patient/family Time For Goal Achievement: 12/10/13 Potential to Achieve Goals: Good  Visit Information  Last PT Received On: 12/05/13 Assistance Needed: +1 History of Present Illness: Pt is a 73 y/o female admitted s/p L THA posterior approach. Waiting on WB status from MD.     Subjective Data  Subjective: "I am ready to go home." Patient Stated Goal: To return home with husband   Cognition  Cognition Arousal/Alertness: Awake/alert Behavior During Therapy: WFL for tasks assessed/performed Overall Cognitive Status: Within Functional Limits for tasks assessed    Balance  Balance Overall balance assessment: Needs assistance Sitting-balance support: Feet supported Sitting balance-Leahy Scale: Fair Standing balance support: Bilateral upper extremity supported Standing balance-Leahy Scale: Fair General Comments General comments (skin integrity, edema, etc.): Issued HEP. pt education on reps/sets/frequency and technique.  Heel slides x15, Hip abd/add x15, LAQ x15, Quad sets x15   End of Session PT - End of Session Equipment Utilized During Treatment: Gait belt Activity Tolerance: Patient tolerated treatment well Patient left: in bed;with call bell/phone within reach Nurse Communication: Mobility status   GP     [name], [name] 12/05/2013, 3:32 PM  Julia CancerLaura Mullen, PT, DPT Acute  Rehabilitation Services Pager:  [phone]

## 2013-12-05 NOTE — Discharge Summary (Signed)
Physician Discharge Summary  Patient ID: [name] L Braziel MRN: [id number] DOB/AGE: [birth year] 73 y.o.  Admit date: 12/03/2013 Discharge date: 12/05/2013  Admission Diagnoses: Osteoarthritis left hip  Discharge Diagnoses: Osteoarthritis left hip Active Problems:   S/P total hip arthroplasty   Discharged Condition: stable  Hospital Course: Patient's hospital course was essentially unremarkable. She underwent total hip arthroplasty postoperatively she progressed well and was discharged to home in stable condition.  Consults: None  Significant Diagnostic Studies: labs: Routine labs  Treatments: surgery: See operative note  Discharge Exam: Blood pressure 114/62, pulse 80, temperature 99 F (37.2 C), temperature source Oral, resp. rate 16, height 5\' 5"  (1.651 m), weight 80.287 kg (177 lb), SpO2 95.00%. Incision/Wound: incision clean and dry  Disposition: 06-Home-Health Care Svc  Discharge Orders   Future Orders Complete By Expires   Call MD / Call 911  As directed    Comments:     If you experience chest pain or shortness of breath, CALL 911 and be transported to the hospital emergency room.  If you develope a fever above 101 F, pus (white drainage) or increased drainage or redness at the wound, or calf pain, call your surgeon's office.   Constipation Prevention  As directed    Comments:     Drink plenty of fluids.  Prune juice may be helpful.  You may use a stool softener, such as Colace (over the counter) 100 mg twice a day.  Use MiraLax (over the counter) for constipation as needed.   Diet - low sodium heart healthy  As directed    Increase activity slowly as tolerated  As directed        Medication List         albuterol 108 (90 BASE) MCG/ACT inhaler  Commonly known as:  PROVENTIL HFA;VENTOLIN HFA  Inhale 2 puffs into the lungs every 6 (six) hours as needed for wheezing.     ALEVE 220 MG tablet  Generic drug:  naproxen sodium  Take 220 mg by mouth 2 (two) times daily  with a meal.     budesonide 0.25 MG/2ML nebulizer solution  Commonly known as:  PULMICORT  Take 0.25 mg by nebulization 3 (three) times daily as needed (asthma, shortness of breath).     calcium carbonate 750 MG chewable tablet  Commonly known as:  TUMS EX  Chew 1 tablet by mouth daily as needed.     hydrochlorothiazide 12.5 MG capsule  Commonly known as:  MICROZIDE  Take 12.5 mg by mouth every morning.     losartan 100 MG tablet  Commonly known as:  COZAAR  Take 100 mg by mouth every morning.     simvastatin 80 MG tablet  Commonly known as:  ZOCOR  Take 80 mg by mouth at bedtime.     traMADol 50 MG tablet  Commonly known as:  ULTRAM  Take 50 mg by mouth every 6 (six) hours as needed (pain).     traMADol 50 MG tablet  Commonly known as:  ULTRAM  Take 1 tablet (50 mg total) by mouth every 6 (six) hours as needed. Maximum dose= 8 tablets per day     Vitamin D-3 1000 UNITS Caps  Take 1,000 Units by mouth 2 (two) times daily.           Follow-up Information   Follow up with Amarys Sliwinski V, MD In 1 week.   Specialty:  Orthopedic Surgery   Contact information:   [address] [place] [address] [phone]  SignedNadara Mustard 12/05/2013, 6:41 AM

## 2013-12-05 NOTE — Progress Notes (Signed)
Occupational Therapy Treatment Patient Details Name: Cristino Martesancy L Gahan MRN: [id number] DOB: 02/10/1941 Today's Date: 12/05/2013 Time: 4098-11910650-0730 OT Time Calculation (min): 40 min  OT Assessment / Plan / Recommendation  History of present illness Pt is a 73 y/o female admitted s/p L THA posterior approach. Waiting on WB status from MD.    OT comments  Pt. Did well with transfers to commode with use of walker. Pt. Did not want to use AE for LE dressing and states husband to A. Pt. Able to stand at sink at S level for 3 min. For grooming tasks. Pt. [facility]. Pt. States she feels ready to leave and d/c home with husband.   Follow Up Recommendations       Barriers to Discharge       Equipment Recommendations       Recommendations for Other Services    Frequency     Progress towards OT Goals Progress towards OT goals: Progressing toward goals  Plan      Precautions / Restrictions Precautions Precautions: Fall;Posterior Hip Precaution Comments: Discussed 3/3 hip precautions with pt Restrictions Weight Bearing Restrictions: Yes LLE Weight Bearing: Weight bearing as tolerated   Pertinent Vitals/Pain Pt. Has no c/o pain   ADL  Toilet Transfer: Supervision/safety;Performed Toilet Transfer Method: Surveyor, mineralstand pivot Toilet Transfer Equipment: Materials engineerBedside commode Toileting - Clothing Manipulation and Hygiene: Performed;Supervision/safety Where Assessed - Engineer, miningToileting Clothing Manipulation and Hygiene: Standing Tub/Shower Transfer: Landscape architectimulated;Min guard Tub/Shower Transfer Method: Stand pivot Equipment Used: Rolling walker Transfers/Ambulation Related to ADLs: Pt. AMB at S level in room. ADL Comments:  (Pt. does not want AE for LE dressing states husband to A.)    OT Diagnosis:    OT Problem List:   OT Treatment Interventions:     OT Goals(current goals can now be found in the care plan section) Acute Rehab OT Goals Patient Stated Goal: to go  home today.  Visit Information  Last OT Received On: 12/05/13 Assistance Needed: +1 History of Present Illness: Pt is a 73 y/o female admitted s/p L THA posterior approach. Waiting on WB status from MD.     Subjective Data      Prior Functioning       Cognition  Cognition Arousal/Alertness: Awake/alert Behavior During Therapy: WFL for tasks assessed/performed Overall Cognitive Status: Within Functional Limits for tasks assessed    Mobility  Bed Mobility Overal bed mobility: Needs Assistance Bed Mobility: Supine to Sit Supine to sit: Min assist General bed mobility comments: Increased time required to reach EOB. Pt required assist to support LLE as she scoots to EOB.  Transfers Sit to Stand: Supervision General transfer comment: does well with hip precuations.    Exercises      Balance    End of Session    GO     Marcelene Weidemann 12/05/2013, 7:29 AM

## 2013-12-05 NOTE — Progress Notes (Signed)
Physical Therapy Treatment Patient Details Name: [name] MRN: [id number] DOB: 06/21/1941 Today's Date: 12/05/2013 Time: 9562-13081046-1113 PT Time Calculation (min): 27 min  PT Assessment / Plan / Recommendation  History of Present Illness Pt is a 73 y/o female admitted s/p L THA posterior approach. Received clarification that pt is WBAT.    PT Comments   Pt progressing towards physical therapy goals. Spoke with pt and RN regarding one more PT session prior to d/c. Pt agreeable. Continues to fatigue quickly throughout session.   Follow Up Recommendations  Home health PT     Does the patient have the potential to tolerate intense rehabilitation     Barriers to Discharge        Equipment Recommendations  None recommended by PT    Recommendations for Other Services    Frequency 7X/week   Progress towards PT Goals Progress towards PT goals: Progressing toward goals  Plan Current plan remains appropriate    Precautions / Restrictions Precautions Precautions: Fall;Posterior Hip Precaution Comments: Discussed 3/3 hip precautions with pt Restrictions Weight Bearing Restrictions: Yes LLE Weight Bearing: Weight bearing as tolerated   Pertinent Vitals/Pain Pt reports overall improved dizziness during PT/OT, however states she still gets slightly dizzy during longer ambulation distances with PT.     Mobility  Bed Mobility Overal bed mobility: Needs Assistance Bed Mobility: Supine to Sit;Sit to Supine Supine to sit: Supervision General bed mobility comments: Increased time required to reach EOB. No physical assist required for LLE, however increased time needed to complete transfer.  Transfers Overall transfer level: Needs assistance Equipment used: Rolling walker (2 wheeled) Transfers: Sit to/from Stand Sit to Stand: Min guard General transfer comment: Pt demonstrated proper hand placement and safety awareness. Increased time needed to power-up to full stand.   Ambulation/Gait Ambulation/Gait assistance: Min guard Ambulation Distance (Feet): 100 Feet Assistive device: Rolling walker (2 wheeled) Gait Pattern/deviations: Step-through pattern;Decreased stride length;Trunk flexed Gait velocity: Decreased Gait velocity interpretation: Below normal speed for age/gender General Gait Details: Increased distance this session. Pt complains of sporatic lightheadedness and takes frequent rest breaks throughout gait training.  Stairs:  (Pt has ramp to enter)    Exercises     PT Diagnosis:    PT Problem List:   PT Treatment Interventions:     PT Goals (current goals can now be found in the care plan section) Acute Rehab PT Goals Patient Stated Goal: To return home with husband PT Goal Formulation: With patient/family Time For Goal Achievement: 12/10/13 Potential to Achieve Goals: Good  Visit Information  Last PT Received On: 12/05/13 Assistance Needed: +1 History of Present Illness: Pt is a 73 y/o female admitted s/p L THA posterior approach. Waiting on WB status from MD.     Subjective Data  Subjective: Pt agreeable to OOB Patient Stated Goal: To return home with husband   Cognition  Cognition Arousal/Alertness: Awake/alert Behavior During Therapy: WFL for tasks assessed/performed Overall Cognitive Status: Within Functional Limits for tasks assessed    Balance  Balance Overall balance assessment: Needs assistance Sitting-balance support: Feet supported Sitting balance-Leahy Scale: Fair Standing balance support: Bilateral upper extremity supported Standing balance-Leahy Scale: Fair  End of Session PT - End of Session Equipment Utilized During Treatment: Gait belt Activity Tolerance: Patient tolerated treatment well Patient left: in bed;with call bell/phone within reach Nurse Communication: Mobility status   GP     Ruthann CancerHamilton, Theta Leaf 12/05/2013, 12:08 PM  Ruthann CancerLaura Hamilton, PT, DPT Acute Rehabilitation Services Pager: [phone]

## 2013-12-29 ENCOUNTER — Ambulatory Visit: Payer: Medicare PPO | Attending: Orthopedic Surgery | Admitting: Rehabilitation

## 2013-12-29 DIAGNOSIS — IMO0001 Reserved for inherently not codable concepts without codable children: Secondary | ICD-10-CM | POA: Insufficient documentation

## 2013-12-29 DIAGNOSIS — M25669 Stiffness of unspecified knee, not elsewhere classified: Secondary | ICD-10-CM | POA: Insufficient documentation

## 2013-12-29 DIAGNOSIS — M25559 Pain in unspecified hip: Secondary | ICD-10-CM | POA: Insufficient documentation

## 2014-01-01 ENCOUNTER — Ambulatory Visit: Payer: Medicare PPO | Admitting: Rehabilitation

## 2014-01-06 ENCOUNTER — Ambulatory Visit: Payer: Medicare PPO | Admitting: Rehabilitation

## 2014-01-08 ENCOUNTER — Ambulatory Visit: Payer: Medicare PPO | Admitting: Rehabilitation

## 2014-01-13 ENCOUNTER — Ambulatory Visit: Payer: Medicare PPO | Admitting: Rehabilitation

## 2014-01-15 ENCOUNTER — Ambulatory Visit: Payer: Medicare PPO | Admitting: Rehabilitation

## 2014-01-20 ENCOUNTER — Ambulatory Visit: Payer: Medicare PPO | Attending: Orthopedic Surgery | Admitting: Rehabilitation

## 2014-01-20 DIAGNOSIS — IMO0001 Reserved for inherently not codable concepts without codable children: Secondary | ICD-10-CM | POA: Diagnosis present

## 2014-01-20 DIAGNOSIS — M25559 Pain in unspecified hip: Secondary | ICD-10-CM | POA: Insufficient documentation

## 2014-01-20 DIAGNOSIS — M25669 Stiffness of unspecified knee, not elsewhere classified: Secondary | ICD-10-CM | POA: Diagnosis not present

## 2014-01-22 ENCOUNTER — Ambulatory Visit: Payer: Medicare PPO | Admitting: Rehabilitation

## 2014-01-26 ENCOUNTER — Ambulatory Visit: Payer: Medicare PPO | Admitting: Rehabilitation

## 2014-01-26 DIAGNOSIS — IMO0001 Reserved for inherently not codable concepts without codable children: Secondary | ICD-10-CM | POA: Diagnosis not present

## 2014-01-29 ENCOUNTER — Encounter: Payer: Medicare PPO | Admitting: Rehabilitation

## 2014-02-02 ENCOUNTER — Ambulatory Visit: Payer: Medicare PPO | Admitting: Rehabilitation

## 2014-02-02 DIAGNOSIS — IMO0001 Reserved for inherently not codable concepts without codable children: Secondary | ICD-10-CM | POA: Diagnosis not present

## 2014-02-04 ENCOUNTER — Encounter: Payer: Medicare PPO | Admitting: Rehabilitation

## 2014-02-11 ENCOUNTER — Encounter: Payer: Medicare PPO | Admitting: Rehabilitation

## 2014-12-31 DIAGNOSIS — H25811 Combined forms of age-related cataract, right eye: Secondary | ICD-10-CM | POA: Insufficient documentation

## 2018-11-05 DIAGNOSIS — H401121 Primary open-angle glaucoma, left eye, mild stage: Secondary | ICD-10-CM | POA: Insufficient documentation

## 2019-03-15 ENCOUNTER — Emergency Department
Admission: EM | Admit: 2019-03-15 | Discharge: 2019-03-15 | Disposition: A | Discharge status: Home / Self Care | Payer: Medicare Other | Source: Home / Self Care | Attending: Emergency Medicine | Admitting: Emergency Medicine

## 2019-03-15 ENCOUNTER — Emergency Department (INDEPENDENT_AMBULATORY_CARE_PROVIDER_SITE_OTHER): Payer: Medicare Other

## 2019-03-15 ENCOUNTER — Encounter: Payer: Self-pay | Admitting: Emergency Medicine

## 2019-03-15 ENCOUNTER — Other Ambulatory Visit: Payer: Self-pay

## 2019-03-15 DIAGNOSIS — W228XXA Striking against or struck by other objects, initial encounter: Secondary | ICD-10-CM | POA: Diagnosis not present

## 2019-03-15 DIAGNOSIS — S92512A Displaced fracture of proximal phalanx of left lesser toe(s), initial encounter for closed fracture: Secondary | ICD-10-CM | POA: Diagnosis not present

## 2019-03-15 DIAGNOSIS — S92502A Displaced unspecified fracture of left lesser toe(s), initial encounter for closed fracture: Secondary | ICD-10-CM | POA: Diagnosis not present

## 2019-03-15 HISTORY — DX: Other allergic rhinitis: J30.89

## 2019-03-15 HISTORY — DX: Type 2 diabetes mellitus without complications: E11.9

## 2019-03-15 IMAGING — DX LEFT FOOT - COMPLETE 3+ VIEW
3 series · 3 of 3 positions shown · non-contrast
Comparison: None

CLINICAL DATA: Left seen toe pain after blunt trauma 2 days ago.

EXAM:
LEFT FOOT - COMPLETE 3+ VIEW

[foot ap]
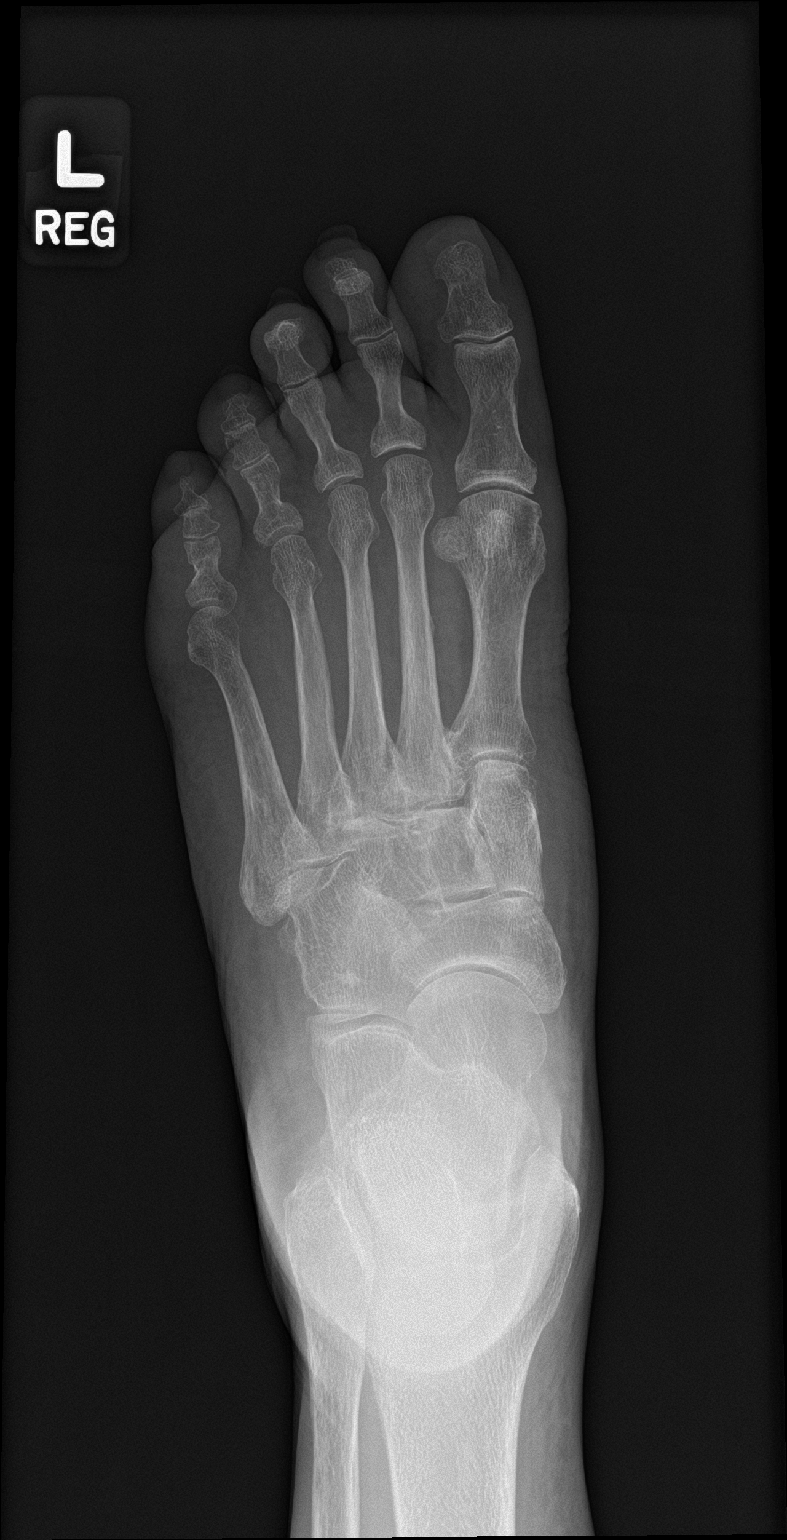

[foot obl]
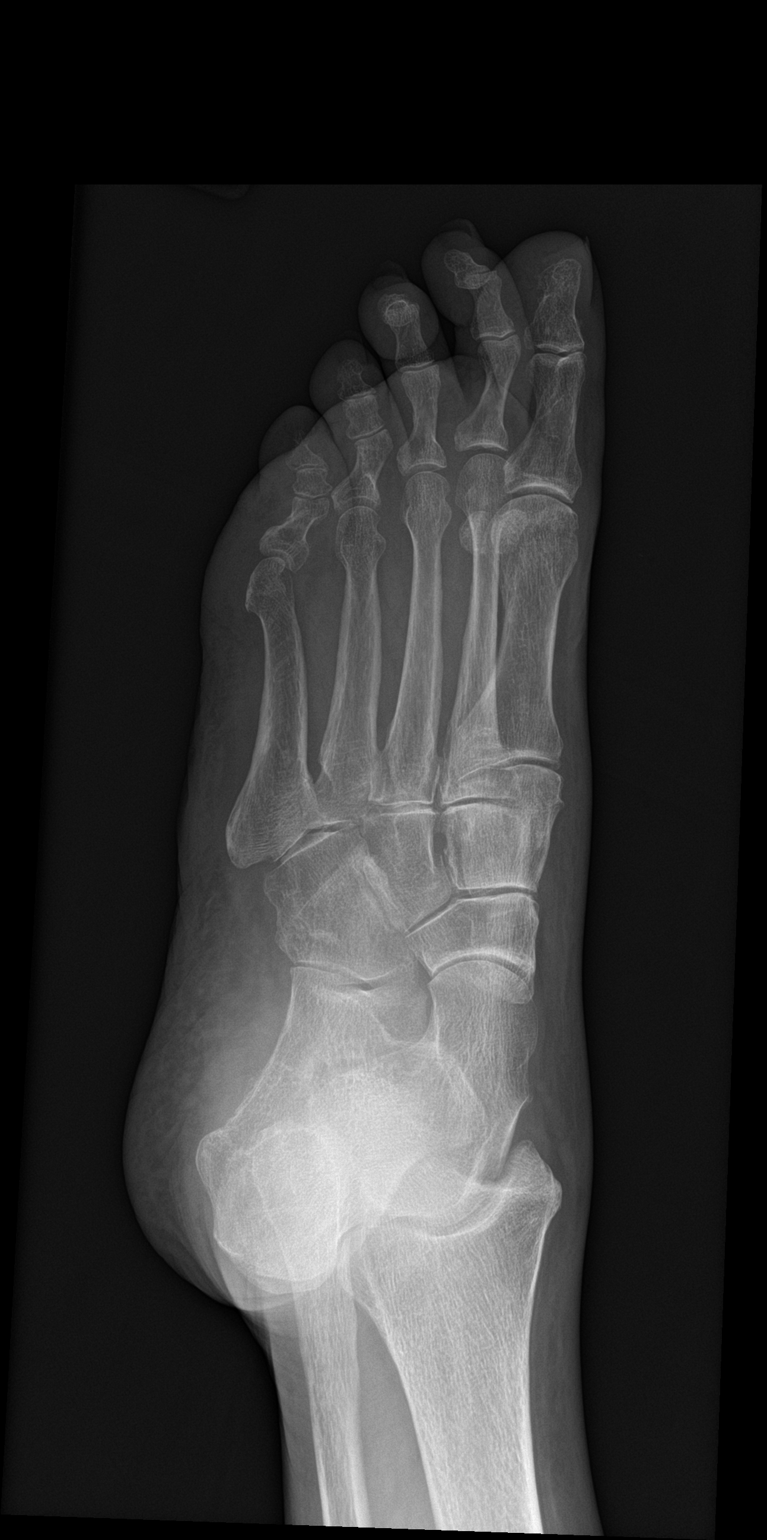

[foot lat]
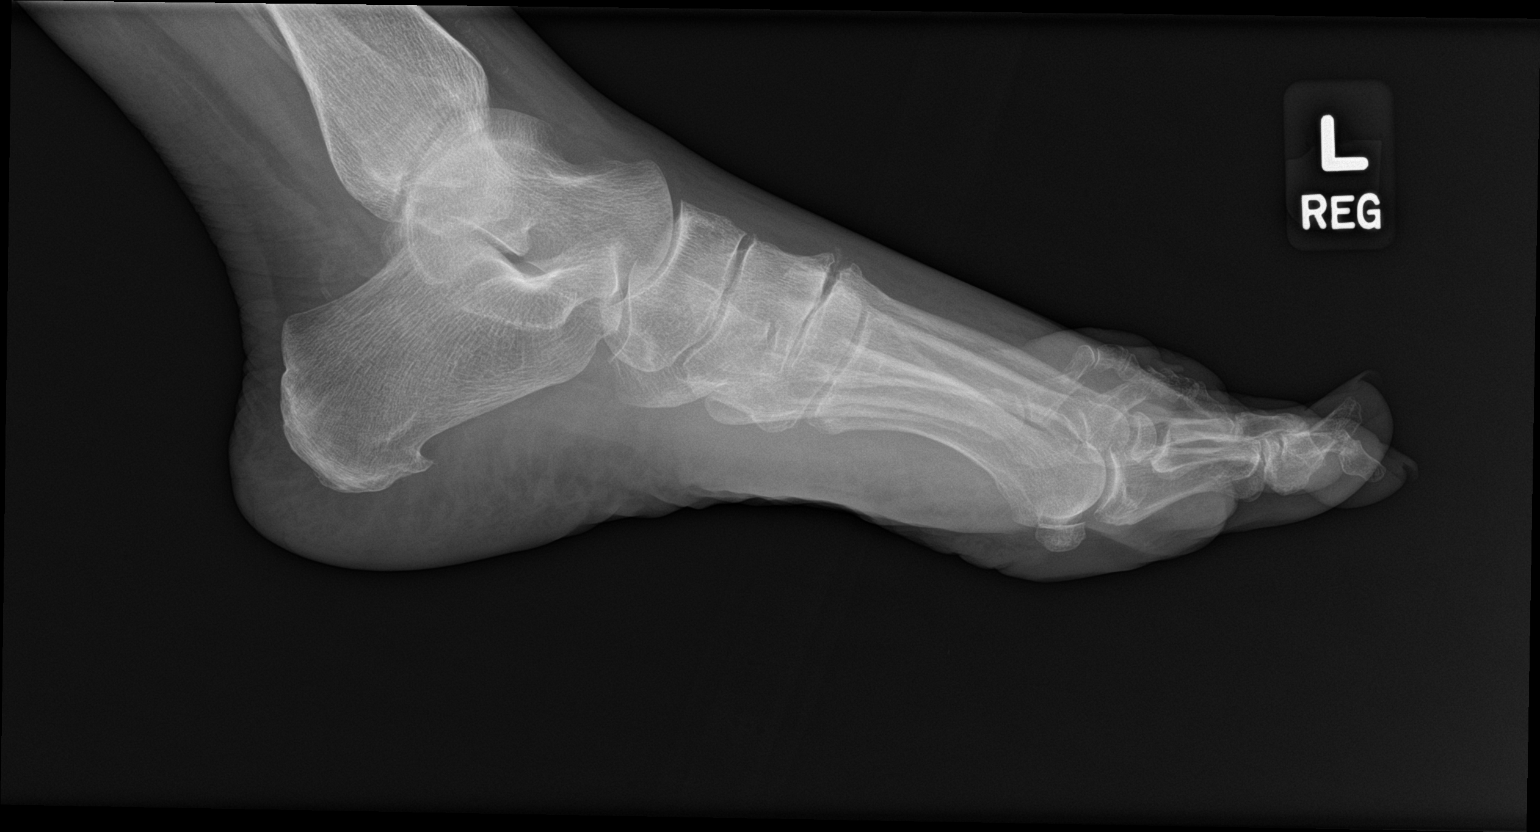

[3 of 3 positions shown; findings below may reference images not displayed]

FINDINGS: There is a slightly angulated slightly displaced fracture of the
midshaft of the proximal phalanx of the little toe.

The other bones of the foot demonstrate no acute abnormality. Dorsal
spurring in the midfoot. Minimal arthritis of the first MTP joint.
IMPRESSION: Slightly angulated slightly displaced fracture of the proximal
phalanx of the little toe.

## 2019-03-15 NOTE — ED Provider Notes (Signed)
[facility]    CSN: [id number] Arrival date & time: 03/15/19  1551     History   Chief Complaint Chief Complaint  Patient presents with  . Toe Pain    HPI [name] is a 78 y.o. female.   HPI Patient reports stubbing fourth and fifth toe of left foot 2 nights ago;  daughter encouraged her to be evaluated.   [facility], requesting evaluation and x-ray for to rule out fracture, and because pain and swelling left fourth and fifth left toe.  Pain is 3 out of 10 at rest, 4 or 5 out of 10 when she walks, 5 or 6 when pressing on left fourth and fifth toes. Left fifth toe especially hurts when she walks, but she is able to walk without assistance. She tried Tylenol which helped the pain somewhat. Of note, she is allergic to NSAIDs and codeine derivatives which have caused swelling in the past. She denies ankle pain, calf swelling or redness or cords.  She has not travelled past 4 weeks. Denies fever, chills, acute myalgias, cough, syncope, acute fall.  Denies chest pain or shortness of breath.  Denies focal neurologic symptoms. Past Medical History:  Diagnosis Date  . Arthritis    "back; left hip; knees" (12/04/2013)  . Asthma   . Chronic bronchitis (HCC)    "usually get it twice/yr" (12/04/2013)  . Diabetes mellitus without complication (HCC)   . Environmental and seasonal allergies   . GERD (gastroesophageal reflux disease)   . High cholesterol   . Hypertension    pcp   [employer]     . Pneumonia    as a child  Reviewed her electronic chart, in care everywhere, she sees Dr. Salvadore Farberorrington, her PCP regularly.  Last A1c 7.3 about 4 months ago.  Diabetes controlled.  CMP and CBC has otherwise been within normal limits.  Patient Active Problem List   Diagnosis Date Noted  . 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 DOE (dyspnea on exertion) 07/16/2013  . Chest pressure 07/16/2013  . Anemia, unspecified 09/14/2009  . GERD 09/14/2009   . VITAMIN D DEFICIENCY 09/02/2009  . HYPERLIPIDEMIA 09/02/2009  . HYPERTENSION 09/02/2009  . ALLERGIC RHINITIS 09/02/2009  . ASTHMA 09/02/2009  . REFLUX ESOPHAGITIS 09/02/2009  . OSTEOPENIA 09/02/2009  . 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 ABDOMINAL HYSTERECTOMY    . BLADDER SUSPENSION    . COLONOSCOPY    . JOINT REPLACEMENT    . SHOULDER ARTHROSCOPY W/ ROTATOR CUFF REPAIR Right   . 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   Procedure: TOTAL HIP ARTHROPLASTY;  Surgeon: Nadara MustardMarcus V Duda, MD;  Location: [facility] OR;  Service: Orthopedics;  Laterality: Left;  Left Total Hip Arthroplasty  . 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   Procedure: TOTAL KNEE ARTHROPLASTY;  Surgeon: Nadara MustardMarcus V Duda, MD;  Location: [facility] OR;  Service: Orthopedics;  Laterality: Right;  Right Total Knee Arthroplasty    OB History   No obstetric history on file.      Home Medications    Prior to Admission medications   Medication Sig Start Date End Date Taking? Authorizing Provider  carvedilol (COREG) 3.125 MG tablet Take 3.125 mg by mouth 2 (two) times daily with a meal.   Yes [provider]  cetirizine (ZYRTEC) 10 MG tablet Take 10 mg by mouth daily.   Yes [provider]  gabapentin (NEURONTIN) 300 MG capsule Take 300 mg by mouth 2 (two) times daily.   Yes [provider]  metFORMIN (GLUCOPHAGE) 500 MG tablet Take by mouth 3 (three) times daily.   Yes [provider]  albuterol (PROVENTIL HFA;VENTOLIN HFA) 108 (90 BASE) MCG/ACT inhaler Inhale 2 puffs into the lungs every 6 (six) hours as needed for wheezing.    [provider]  budesonide (PULMICORT) 0.25 MG/2ML nebulizer solution Take 0.25 mg by nebulization 3 (three) times daily as needed (asthma, shortness of breath).     [provider]  calcium carbonate (TUMS EX) 750 MG chewable tablet Chew 1 tablet by mouth  daily as needed.     [provider]  Cholecalciferol (VITAMIN D-3) 1000 UNITS CAPS Take 1,000 Units by mouth 2 (two) times daily.     [provider]  hydrochlorothiazide (MICROZIDE) 12.5 MG capsule Take 12.5 mg by mouth every morning.     [provider]  losartan (COZAAR) 100 MG tablet Take 100 mg by mouth every morning.     [provider]  simvastatin (ZOCOR) 80 MG tablet Take 80 mg by mouth at bedtime.    [provider]    Family History Family History  Problem Relation Age of Onset  . Hypertension Mother   . Heart disease Mother   . Transient ischemic attack Mother   . CAD Father   . Cancer - Ovarian Sister   Positive for hypertension, heart disease, ovarian cancer.  Social History Social History   Tobacco Use  . Smoking status: Never Smoker  . Smokeless tobacco: Never Used  Substance Use Topics  . Alcohol use: No  . Drug use: No   Non-smoker  Allergies   Biaxin [clarithromycin], Nsaids, Oxycontin [oxycodone], and Vicodin [hydrocodone-acetaminophen]   Review of Systems Review of Systems  All other systems reviewed and are negative.  Pertinent items noted in HPI and remainder of comprehensive ROS otherwise negative.   Physical Exam Triage Vital Signs ED Triage Vitals  Enc Vitals Group     BP 03/15/19 1617 (!) 168/81     Pulse Rate 03/15/19 1617 67     Resp 03/15/19 1617 16     Temp 03/15/19 1617 97.6 F (36.4 C)     Temp Source 03/15/19 1617 Oral     SpO2 03/15/19 1617 98 %     Weight 03/15/19 1619 172 lb (78 kg)     Height 03/15/19 1619 5\' 8"  (1.727 m)     Head Circumference --      Peak Flow --      Pain Score 03/15/19 1618 3     Pain Loc --      Pain Edu? --      Excl. in [place]? --    No data found.  Updated Vital Signs BP (!) 168/81 (BP Location: Right Arm)   Pulse 67   Temp 97.6 F (36.4 C) (Oral)   Resp 16   Ht 5\' 8"  (1.727 m)   Wt 78 kg   SpO2 98%   BMI 26.15 kg/m   Visual Acuity Right  Eye Distance:   Left Eye Distance:   Bilateral Distance:    Right Eye Near:   Left Eye Near:    Bilateral Near:     Physical Exam Vitals signs reviewed.  Constitutional:      General: She is not in acute distress.    Appearance: She is well-developed.  HENT:  Head: Normocephalic and atraumatic.  Eyes:     General: No scleral icterus.    Pupils: Pupils are equal, round, and reactive to light.  Neck:     Musculoskeletal: Normal range of motion and neck supple.  Cardiovascular:     Rate and Rhythm: Normal rate and regular rhythm.  Pulmonary:     Effort: Pulmonary effort is normal.  Abdominal:     General: There is no distension.  Musculoskeletal:     Left ankle: Normal.     Left lower leg: She exhibits no tenderness. No edema.       Feet:  Skin:    General: Skin is warm and dry.  Neurological:     Mental Status: She is alert and oriented to person, place, and time.     Cranial Nerves: No cranial nerve deficit.  Psychiatric:        Behavior: Behavior normal.      UC Treatments / Results  Labs (all labs ordered are listed, but only abnormal results are displayed) Labs Reviewed - No data to display  EKG None  Radiology Dg Foot Complete Left  Result Date: 03/15/2019 CLINICAL DATA:  Left seen toe pain after blunt trauma 2 days ago. EXAM: LEFT FOOT - COMPLETE 3+ VIEW COMPARISON:  None FINDINGS: There is a slightly angulated slightly displaced fracture of the midshaft of the proximal phalanx of the little toe. The other bones of the foot demonstrate no acute abnormality. Dorsal spurring in the midfoot. Minimal arthritis of the first MTP joint. IMPRESSION: Slightly angulated slightly displaced fracture of the proximal phalanx of the little toe. Electronically Signed   By: Francene BoyersJames  Maxwell M.D.   On: 03/15/2019 16:33    Procedures Procedures (including critical care time)  Medications Ordered in UC Medications - No data to display  Initial Impression / Assessment  and Plan / UC Course  I have reviewed the triage vital signs and the nursing notes.  Pertinent labs & imaging results that were available during my care of the patient were reviewed by me and considered in my medical decision making (see chart for details).     Reviewed x-rays with patient, showing minimally displaced, minimal angulation of hairline fracture left fifth toe proximal phalanx.  See report above.  I gave patient copy of radiology report and 1 view of the x-ray printed for patient.   Final Clinical Impressions(s) / UC Diagnoses   Final diagnoses:  Closed fracture of phalanx of left fifth toe, initial encounter  After risk benefits alternatives of treatment discussed, patient declined a postop shoe for support as she feels she has supportive large shoe to wear. Today, we buddy taped left fourth and fifth toe. AVS printed, see instructions below.  Written and verbal instructions given. Red flags discussed.  She voiced understanding and agreement.  Note, I am avoiding NSAIDs or codeine pain meds as she is allergic to them.   Discharge Instructions     X-ray shows hairline fracture left fifth toe, please see the copy of the x-ray and x-ray report that I printed out for you. Treatment is buddy taping left fourth and fifth toe, which we did today. Continue Tylenol as needed for pain.  We are staying away from NSAIDs and codeine medicines because you are allergic to them. Okay to do ice and elevation if needed. Wear supportive shoe.  (We offered to place a special foot brace/shoe, but you declined that today) Please read attached instruction sheet on toe fracture. I would  anticipate the pain to improve in the next week or 2.  Please follow-up with your PCP or orthopedist if not improved in 2 weeks, or sooner if worse or any concerning foot symptoms.    ED Prescriptions    None     Controlled Substance Prescriptions [place] Controlled Substance Registry consulted? Not Applicable    Lajean ManesMassey, David, MD 03/15/19 1718

## 2019-03-15 NOTE — ED Triage Notes (Signed)
Patient reports stubbing small toe left foot 2 nights ago; daughter encouraged her to be evaluated. Walking without assistance. She has not travelled past 4 weeks.

## 2019-03-15 NOTE — Discharge Instructions (Addendum)
X-ray shows hairline fracture left fifth toe, please see the copy of the x-ray and x-ray report that I printed out for you. Treatment is buddy taping left fourth and fifth toe, which we did today. Continue Tylenol as needed for pain.  We are staying away from NSAIDs and codeine medicines because you are allergic to them. Okay to do ice and elevation if needed. Wear supportive shoe.  (We offered to place a special foot brace/shoe, but you declined that today) Please read attached instruction sheet on toe fracture. I would anticipate the pain to improve in the next week or 2.  Please follow-up with your PCP or orthopedist if not improved in 2 weeks, or sooner if worse or any concerning foot symptoms.

## 2019-03-27 LAB — BASIC METABOLIC PANEL
BUN: 23 — AB (ref 4–21)
Creatinine: 1 (ref 0.5–1.1)
Glucose: 154
Potassium: 4 (ref 3.4–5.3)
Sodium: 139 (ref 137–147)

## 2019-03-27 LAB — HEPATIC FUNCTION PANEL
ALT: 11 (ref 7–35)
AST: 11 — AB (ref 13–35)
Alkaline Phosphatase: 83 (ref 25–125)
Bilirubin, Total: 0.3

## 2019-03-27 LAB — HEMOGLOBIN A1C: Hemoglobin A1C: 7.9

## 2019-03-30 DIAGNOSIS — N1831 Chronic kidney disease, stage 3a: Secondary | ICD-10-CM | POA: Insufficient documentation

## 2019-03-30 DIAGNOSIS — N183 Chronic kidney disease, stage 3 unspecified: Secondary | ICD-10-CM | POA: Insufficient documentation

## 2019-05-01 ENCOUNTER — Encounter: Payer: Self-pay | Admitting: Family Medicine

## 2019-05-01 ENCOUNTER — Ambulatory Visit: Payer: Medicare Other | Admitting: Family Medicine

## 2019-05-01 ENCOUNTER — Other Ambulatory Visit: Payer: Self-pay

## 2019-05-01 VITALS — BP 140/78 | HR 66 | Ht 65.0 in | Wt 174.0 lb

## 2019-05-01 DIAGNOSIS — N183 Chronic kidney disease, stage 3 unspecified: Secondary | ICD-10-CM

## 2019-05-01 DIAGNOSIS — Z6828 Body mass index (BMI) 28.0-28.9, adult: Secondary | ICD-10-CM

## 2019-05-01 DIAGNOSIS — R21 Rash and other nonspecific skin eruption: Secondary | ICD-10-CM | POA: Diagnosis not present

## 2019-05-01 DIAGNOSIS — H401121 Primary open-angle glaucoma, left eye, mild stage: Secondary | ICD-10-CM

## 2019-05-01 DIAGNOSIS — E1149 Type 2 diabetes mellitus with other diabetic neurological complication: Secondary | ICD-10-CM

## 2019-05-01 DIAGNOSIS — I10 Essential (primary) hypertension: Secondary | ICD-10-CM

## 2019-05-01 DIAGNOSIS — Z6826 Body mass index (BMI) 26.0-26.9, adult: Secondary | ICD-10-CM | POA: Insufficient documentation

## 2019-05-01 MED ORDER — TRIAMCINOLONE ACETONIDE 0.5 % EX OINT: 1.0000 "application " | TOPICAL_OINTMENT | Freq: Two times a day (BID) | CUTANEOUS | 0 refills | Status: DC | PRN

## 2019-05-01 MED ORDER — BLOOD GLUCOSE METER KIT: PACK | 0 refills | Status: AC

## 2019-05-01 NOTE — Assessment & Plan Note (Signed)
Discussed working on weight loss to reduce BP and glucose.

## 2019-05-01 NOTE — Assessment & Plan Note (Signed)
Plan to check every 6 months.

## 2019-05-01 NOTE — Assessment & Plan Note (Signed)
Last a1C in July was 7.9. not well controlled. Discussed working on diet and exercise for next 2 months. Keep f/U in October and if not  7.5 or less then increase metformin. Currently takes 1000mg  in AM and 500mg  in PM.

## 2019-05-01 NOTE — Progress Notes (Addendum)
New Patient Office Visit  Subjective:  Patient ID: [name], female    DOB: 1940-10-04  Age: 78 y.o. MRN: [id number]  CC:  Chief Complaint  Patient presents with  . Establish Care    HPI [name] presents to establish care.   She was treated for a sinus infection about 8 days ago. She finished her zpack but says she still has some left sided head and frontal Headache and still some drainage but does feel some better. No fever or chills or sweats.    Says her BP has been high at home and doc office the last few months.  Her husband passed away in 02/09/2023 and has been stressed and thinks her BP went up around that time. She now liver alone.   She takes her medicines regularly. No other recent changes to her regimen.  Home BPS running [phone].   She also has a rash on her forearms and right mid-abdomen. Started about 5 days ago. Very itchy. Using OTC hydrocortisone cream -not helping much. Was pulling some weeds in the yard.    Diabetes - no hypoglycemic events. No wounds or sores that are not healing well. No increased thirst or urination. Checking glucose at home. Taking medications as prescribed without any side effects.Takes metformin. Currently takes 1044m in AM and 504min PM.   Past Medical History:  Diagnosis Date  . Arthritis    "back; left hip; knees" (12/04/2013)  . Asthma   . [place] [facility] [place]   "usually get it twice/yr" (12/04/2013)  . Diabetes mellitus without complication ([facility]  . Environmental and seasonal allergies   . GERD (gastroesophageal reflux disease)   . High cholesterol   . Hypertension    pcp   [facility]   . Pneumonia    as a child    Past Surgical History:  Procedure Laterality Date  . ABDOMINAL HYSTERECTOMY    . BLADDER SUSPENSION    . COLONOSCOPY    . JOINT REPLACEMENT    . SHOULDER ARTHROSCOPY W/ ROTATOR CUFF REPAIR Right   . 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    Procedure: TOTAL HIP ARTHROPLASTY;  Surgeon: MaNewt MinionMD;  [facility];  Laterality: Left;  Left Total Hip Arthroplasty  . 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   Procedure: TOTAL KNEE ARTHROPLASTY;  Surgeon: MaNewt MinionMD;  [facility];  Laterality: Right;  Right Total Knee Arthroplasty    Family History  Problem Relation Age of Onset  . Hypertension Mother   . Heart disease Mother   . Transient ischemic attack Mother   . CAD Father   . Cancer - Ovarian Sister   . Diabetes Daughter     Social History   Socioeconomic History  . Marital status: Widowed    Spouse name: Not on file  . Number of children: Not on file  . Years of education: Not on file  . Highest education level: Not on file  Occupational History  . Not on file  Social Needs  . Financial resource strain: Not on file  . Food insecurity    Worry: Not on file    Inability: Not on file  . Transportation needs    Medical: Not on file    Non-medical: Not on file  Tobacco Use  . Smoking status: Never Smoker  . Smokeless tobacco: Never Used  Substance and Sexual Activity  . Alcohol use: No  . Drug use: No  . Sexual activity: Yes  Lifestyle  . Physical activity    Days per week: Not on file    Minutes per session: Not on file  . Stress: Not on file  Relationships  . Social Herbalist on phone: Not on file    Gets together: Not on file    Attends religious service: Not on file    Active member of club or organization: Not on file    Attends meetings of clubs or organizations: Not on file    Relationship status: Not on file  . Intimate partner violence    Fear of current or ex partner: Not on file    Emotionally abused: Not on file    Physically abused: Not on file    Forced sexual activity: Not on file  Other Topics Concern  . Not on file  Social History Narrative   Retired. Widowed. Husband died 01/18/2019    ROS Review of  Systems  Constitutional: Negative for diaphoresis, fever and unexpected weight change.  HENT: Positive for sinus pain. Negative for hearing loss, postnasal drip, sneezing and tinnitus.   Eyes: Negative for visual disturbance.  Respiratory: Negative for cough and wheezing.   Cardiovascular: Negative for chest pain and palpitations.  Genitourinary: Negative for vaginal bleeding and vaginal discharge.  Musculoskeletal: Positive for arthralgias and back pain.  Neurological: Positive for headaches.  Hematological: Negative for adenopathy. Does not bruise/bleed easily.    Objective:   Today's Vitals: BP 140/78   Pulse 66   Ht '5\' 5"'  (1.651 m)   Wt 174 lb (78.9 kg)   SpO2 99%   BMI 28.96 kg/m   Physical Exam Constitutional:      Appearance: She is well-developed.  HENT:     Head: Normocephalic and atraumatic.  Cardiovascular:     Rate and Rhythm: Normal rate and regular rhythm.     Heart sounds: Normal heart sounds.  Pulmonary:     Effort: Pulmonary effort is normal.     Breath sounds: Normal breath sounds.  Skin:    General: Skin is warm and dry.     Findings: Rash present.     Comments: Erythematous papules in linear fashion on both inner forearms and right mid abdomen.   Neurological:     Mental Status: She is alert and oriented to person, place, and time.  Psychiatric:        Behavior: Behavior normal.     Assessment & Plan:   Problem List Items Addressed This Visit      Cardiovascular and Mediastinum   Essential hypertension    Not well controlled. Inc carvedilol to 6.25 mg. F/U in 3 weeks for nurse visit. Check BPs at home as well. Recommend low salt diet.       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 not well controlled. Discussed working on diet and exercise for next 2  months. Keep f/U in October and if not  7.5 or less then increase metformin. Currently takes 1067m in AM and 5050min PM.       Relevant Medications   aspirin 81 MG chewable tablet   blood glucose meter kit and supplies  Genitourinary   Chronic kidney disease (CKD), stage III (moderate) (HCC)    Plan to check every 6 months.         Other   Primary open angle glaucoma (POAG) of left eye, mild stage   Relevant Medications   dorzolamide (TRUSOPT) 2 % ophthalmic solution   BMI 28.0-28.9,adult    Discussed working on weight loss to reduce BP and glucose.         Other Visit Diagnoses    Rash       Relevant Medications   triamcinolone ointment (KENALOG) 0.5 %     Rash - likley contact dermatiti like poison ivy. Will tx with topical steroid cream.    Outpatient Encounter Medications as of 05/01/2019  Medication Sig  . aspirin 81 MG chewable tablet Chew 1 tablet by mouth daily.  . carvedilol (COREG) 3.125 MG tablet Take 3.125 mg by mouth 2 (two) times daily with a meal.  . cetirizine (ZYRTEC) 10 MG tablet Take 10 mg by mouth daily.  . Cholecalciferol (VITAMIN D-3) 1000 UNITS CAPS Take 1,000 Units by mouth 2 (two) times daily.   . dorzolamide (TRUSOPT) 2 % ophthalmic solution Place 1 drop into both eyes 2 (two) times daily.  Marland Kitchen gabapentin (NEURONTIN) 300 MG capsule Take 300 mg by mouth 2 (two) times daily.  . hydrochlorothiazide (HYDRODIURIL) 25 MG tablet Take 0.5 tablets by mouth 2 (two) times daily.   . metFORMIN (GLUCOPHAGE) 500 MG tablet Take by mouth 3 (three) times daily.  . simvastatin (ZOCOR) 80 MG tablet Take 80 mg by mouth at bedtime.  . [DISCONTINUED] fluticasone (FLONASE) 50 MCG/ACT nasal spray SHAKE LQ AND U 1 SPR IEN D  . [DISCONTINUED] ipratropium-albuterol (DUONEB) 0.5-2.5 (3) MG/3ML SOLN Inhale 3 mLs into the lungs 4 (four) times daily as needed.  . [DISCONTINUED] ketorolac (ACULAR) 0.4 % SOLN Place 1 drop into the left eye 4 (four) times daily.  .  [DISCONTINUED] moxifloxacin (VIGAMOX) 0.5 % ophthalmic solution Place 1 drop into the left eye 4 (four) times daily.  . [DISCONTINUED] prednisoLONE acetate (PRED FORTE) 1 % ophthalmic suspension Place 1 drop into the left eye 4 (four) times daily.  . [DISCONTINUED] verapamil (CALAN-SR) 120 MG CR tablet Take 1 tablet by mouth daily.  . blood glucose meter kit and supplies Dispense based on patient and insurance preference. For testing up to twice daily. E11.49  . triamcinolone ointment (KENALOG) 0.5 % Apply 1 application topically 2 (two) times daily as needed. For rash and itching.  . [DISCONTINUED] albuterol (PROVENTIL HFA;VENTOLIN HFA) 108 (90 BASE) MCG/ACT inhaler Inhale 2 puffs into the lungs every 6 (six) hours as needed for wheezing.  . [DISCONTINUED] budesonide (PULMICORT) 0.25 MG/2ML nebulizer solution Take 0.25 mg by nebulization 3 (three) times daily as needed (asthma, shortness of breath).   . [DISCONTINUED] calcium carbonate (TUMS EX) 750 MG chewable tablet Chew 1 tablet by mouth daily as needed.   . [DISCONTINUED] hydrochlorothiazide (MICROZIDE) 12.5 MG capsule Take 12.5 mg by mouth every morning.   . [DISCONTINUED] losartan (COZAAR) 100 MG tablet Take 100 mg by mouth every morning.    No facility-administered encounter medications on file as of 05/01/2019.     Follow-up: Return in about 3 weeks (around 05/22/2019) for nurse visit.   F/U in after 10/9 for Diabetes.   Beatrice Lecher, MD

## 2019-05-01 NOTE — Assessment & Plan Note (Addendum)
Not well controlled. Inc carvedilol to 6.25 mg. F/U in 3 weeks for nurse visit. Check BPs at home as well. Recommend low salt diet.

## 2019-05-01 NOTE — Patient Instructions (Addendum)
Increase your carvedilol to two tabs by mouth twice a day.   Work on diet and exercise.

## 2019-05-06 ENCOUNTER — Other Ambulatory Visit: Payer: Self-pay | Admitting: *Deleted

## 2019-05-06 MED ORDER — ACCU-CHEK FASTCLIX LANCETS MISC: 4 refills | Status: DC

## 2019-05-12 ENCOUNTER — Ambulatory Visit (INDEPENDENT_AMBULATORY_CARE_PROVIDER_SITE_OTHER): Payer: Medicare Other | Admitting: Physician Assistant

## 2019-05-12 ENCOUNTER — Encounter: Payer: Self-pay | Admitting: Physician Assistant

## 2019-05-12 VITALS — BP 149/79 | Temp 97.9°F | Ht 65.0 in | Wt 172.0 lb

## 2019-05-12 DIAGNOSIS — J0140 Acute pansinusitis, unspecified: Secondary | ICD-10-CM

## 2019-05-12 MED ORDER — AMOXICILLIN-POT CLAVULANATE 875-125 MG PO TABS: 1.0000 | ORAL_TABLET | Freq: Two times a day (BID) | ORAL | 0 refills | Status: DC

## 2019-05-12 NOTE — Progress Notes (Signed)
Patient ID: Julia MartesNancy L Valladares, female   DOB: [birth year], 78 y.o.   MRN: [id number]  .Marland Kitchen.Virtual Visit via Telephone Note  I connected with Julia MartesNancy L Mullen on 05/12/19 at  9:50 AM EDT by telephone and verified that I am speaking with the correct person using two identifiers.  Location: Patient: home Provider: clinic   I discussed the limitations, risks, security and privacy concerns of performing an evaluation and management service by telephone and the availability of in person appointments. I also discussed with the patient that there may be a patient responsible charge related to this service. The patient expressed understanding and agreed to proceed.   History of Present Illness: Pt is a 78 yo female who calls into the clinic with two weeks of sinus symptoms. She reports she has had no covid exposures and only goes to the grocery store. She has very little traffic through her home. Symptoms have gradually worsened and start out more like her average "cold". She has a lot of sinus pressure and blowing out yellow snot. She has headache and cough. She denies any SOB, loss of smell or taste, GI symptoms, weakness, fatigue. Her ears do feel a little "tight". She has hx of sinus infections. She has taken some OTC alka seltzer which helps some.   .. Active Ambulatory Problems    Diagnosis Date Noted  . VITAMIN D DEFICIENCY 09/02/2009  . HYPERLIPIDEMIA 09/02/2009  . Anemia, unspecified 09/14/2009  . Essential hypertension 09/02/2009  . ALLERGIC RHINITIS 09/02/2009  . REFLUX ESOPHAGITIS 09/02/2009  . URINARY INCONTINENCE, 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  . 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 Primary open angle glaucoma (POAG) of left eye, mild stage 11/05/2018  . Presence of orthopedic joint implant 12/03/2013  . Osteopenia 07/18/2010  . DDD (degenerative disc disease), lumbar 01/05/2012  . Combined forms of age-related cataract  of right eye 12/31/2014  . Backache 11/01/2009  . Asthma in adult 07/18/2010  . Chronic kidney disease (CKD), stage III (moderate) (HCC) 03/30/2019  . BMI 28.0-28.9,adult 05/01/2019   Resolved Ambulatory Problems    Diagnosis Date Noted  . ASTHMA 09/02/2009  . GERD 09/14/2009  . OSTEOPENIA 09/02/2009  . DOE (dyspnea on exertion) 07/16/2013  . Chest pressure 07/16/2013   Past Medical History:  Diagnosis Date  . Arthritis   . Asthma   . Chronic bronchitis (HCC)   . Diabetes mellitus without complication (HCC)   . Environmental and seasonal allergies   . GERD (gastroesophageal reflux disease)   . High cholesterol   . Hypertension   . Pneumonia    Reviewed med, allergy, problem list.     Observations/Objective: No acute distress.  No labored breathing.  Normal mood.   .. Today's Vitals   05/12/19 0952  BP: (!) 149/79  Temp: 97.9 F (36.6 C)  TempSrc: Oral  Weight: 172 lb (78 kg)  Height: 5\' 5"  (1.651 m)   Body mass index is 28.62 kg/m.   Assessment and Plan: Marland Kitchen.Marland Kitchen.Diagnoses and all orders for this visit:  Acute non-recurrent pansinusitis -     amoxicillin-clavulanate (AUGMENTIN) 875-125 MG tablet; Take 1 tablet by mouth 2 (two) times daily.   Due to length of symptoms and no other COVID symptoms likely not covid. Pt did not want to be tested and agreed to self isolate until at least 3 days of being symptom free. Treated for sinusitis today. Follow up as needed or with any worsening symptoms.  Follow Up Instructions:    I discussed the assessment and treatment plan with the patient. The patient was provided an opportunity to ask questions and all were answered. The patient agreed with the plan and demonstrated an understanding of the instructions.   The patient was advised to call back or seek an in-person evaluation if the symptoms worsen or if the condition fails to improve as anticipated.  I provided 10 minutes of non-face-to-face time during this  encounter.   Iran Planas, PA-C

## 2019-05-12 NOTE — Progress Notes (Deleted)
Was in office two weeks ago - sinus problems No better Headache, sinus pressure, drainage in back of throat all the time. Hurts for glasses to be on nose, coughing up drainage - clear/yellow tint No fevers Taking tylenol for headaches, afraid to take allergy meds due to blood pressure

## 2019-05-22 ENCOUNTER — Ambulatory Visit (INDEPENDENT_AMBULATORY_CARE_PROVIDER_SITE_OTHER): Payer: Medicare Other | Admitting: Family Medicine

## 2019-05-22 ENCOUNTER — Other Ambulatory Visit: Payer: Self-pay

## 2019-05-22 VITALS — BP 121/62 | HR 57 | Ht 65.0 in | Wt 171.0 lb

## 2019-05-22 DIAGNOSIS — I10 Essential (primary) hypertension: Secondary | ICD-10-CM | POA: Diagnosis not present

## 2019-05-22 NOTE — Progress Notes (Signed)
Agree with documentation as above. Bp looks great!   Beatrice Lecher, MD

## 2019-05-22 NOTE — Progress Notes (Signed)
   Subjective:    Patient ID: [name], female    DOB: 02-12-1941, 78 y.o.   MRN: [id number]  HPI Patient is here for blood pressure check. Recently had Carvedilol was increased to 2 tablets BID.   Review of Systems     Objective:   Physical Exam        Assessment & Plan:  Blood pressure today was great at 121/62 pulse 57. Patient has been taking increased dose and denies any side effects. States she check BP at home but cannot recall readings. Patient did state she has noticed a lot of her headaches have resolved.   Patient advised to keep on same dose and let her know I would call her if provider had any additional recommendations.

## 2019-05-28 ENCOUNTER — Other Ambulatory Visit: Payer: Self-pay | Admitting: *Deleted

## 2019-05-28 DIAGNOSIS — E1149 Type 2 diabetes mellitus with other diabetic neurological complication: Secondary | ICD-10-CM

## 2019-05-28 MED ORDER — ACCU-CHEK GUIDE VI STRP: ORAL_STRIP | 6 refills | Status: DC

## 2019-06-19 ENCOUNTER — Other Ambulatory Visit: Payer: Self-pay

## 2019-06-19 ENCOUNTER — Encounter: Payer: Self-pay | Admitting: Family Medicine

## 2019-06-19 ENCOUNTER — Ambulatory Visit: Payer: Medicare Other | Admitting: Family Medicine

## 2019-06-19 VITALS — BP 139/69 | HR 53 | Ht 65.0 in | Wt 173.0 lb

## 2019-06-19 DIAGNOSIS — J0100 Acute maxillary sinusitis, unspecified: Secondary | ICD-10-CM

## 2019-06-19 DIAGNOSIS — I10 Essential (primary) hypertension: Secondary | ICD-10-CM | POA: Diagnosis not present

## 2019-06-19 DIAGNOSIS — Z23 Encounter for immunization: Secondary | ICD-10-CM

## 2019-06-19 DIAGNOSIS — E1149 Type 2 diabetes mellitus with other diabetic neurological complication: Secondary | ICD-10-CM | POA: Diagnosis not present

## 2019-06-19 LAB — POCT GLYCOSYLATED HEMOGLOBIN (HGB A1C): Hemoglobin A1C: 7.5 % — AB (ref 4.0–5.6)

## 2019-06-19 LAB — POCT UA - MICROALBUMIN
Creatinine, POC: 100 mg/dL
Microalbumin Ur, POC: 30 mg/L

## 2019-06-19 MED ORDER — AMOXICILLIN-POT CLAVULANATE 875-125 MG PO TABS: 1.0000 | ORAL_TABLET | Freq: Two times a day (BID) | ORAL | 0 refills | Status: DC

## 2019-06-19 MED ORDER — GABAPENTIN 300 MG PO CAPS: 300.0000 mg | ORAL_CAPSULE | Freq: Two times a day (BID) | ORAL | 1 refills | Status: DC

## 2019-06-19 MED ORDER — CARVEDILOL 6.25 MG PO TABS: 6.2500 mg | ORAL_TABLET | Freq: Two times a day (BID) | ORAL | 1 refills | Status: DC

## 2019-06-19 MED ORDER — METFORMIN HCL 500 MG PO TABS: 500.0000 mg | ORAL_TABLET | Freq: Three times a day (TID) | ORAL | 1 refills | Status: DC

## 2019-06-19 NOTE — Assessment & Plan Note (Addendum)
Doing great on increased dose of carvedilol.  She has been taking 2 tabs what and switch her to the 6.25 mg twice a day.  New prescription sent to pharmacy.  Well controlled. Continue current regimen. Follow up in  3-4 mo.

## 2019-06-19 NOTE — Progress Notes (Signed)
Established Patient Office Visit  Subjective:  Patient ID: [name], female    DOB: Apr 15, 1941  Age: 78 y.o. MRN: [id number]  CC:  Chief Complaint  Patient presents with  . Diabetes    HPI ARLYCE Mullen presents for   Diabetes - no hypoglycemic events. No wounds or sores that are not healing well. No increased thirst or urination. Checking glucose at home. Taking medications as prescribed without any side effects.  She is trying to cut back on things like potatoes and carbs.  Hypertension- Pt denies chest pain, SOB, dizziness, or heart palpitations.  Taking meds as directed w/o problems.  Denies medication side effects.    She is also having problems with her sinuses.  She had a sinus infection in early August.  She says she completed her antibiotic and felt better for about 2 weeks and then felt like she was gradually starting to get some sinus symptoms again.  She mostly complains of pressure over the left frontal area and left maxillary area.  She says she just feels congested.  She has been taking her Zyrtec daily and has been using her Flonase.  No fevers chills or sweats.  She says mostly she is blowing out clear mucus out of her nose and occasionally will cough up something yellow.  But otherwise no significant cough or shortness of breath.   Past Medical History:  Diagnosis Date  . Arthritis    "back; left hip; knees" (12/04/2013)  . Asthma   . Chronic bronchitis ([place])    "usually get it twice/yr" (12/04/2013)  . Diabetes mellitus without complication ([place])   . Environmental and seasonal allergies   . GERD (gastroesophageal reflux disease)   . High cholesterol   . Hypertension    pcp   [place]     . Pneumonia    as a child    Past Surgical History:  Procedure Laterality Date  . ABDOMINAL HYSTERECTOMY    . BLADDER SUSPENSION    . COLONOSCOPY    . JOINT REPLACEMENT    . SHOULDER ARTHROSCOPY W/ ROTATOR CUFF REPAIR Right   . TOTAL HIP ARTHROPLASTY Left  12/03/2013  . 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   Procedure: TOTAL HIP ARTHROPLASTY;  Surgeon: Newt Minion, MD;  Location: [facility];  Service: Orthopedics;  Laterality: Left;  Left Total Hip Arthroplasty  . 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   Procedure: TOTAL KNEE ARTHROPLASTY;  Surgeon: Newt Minion, MD;  Location: [facility];  Service: Orthopedics;  Laterality: Right;  Right Total Knee Arthroplasty    Family History  Problem Relation Age of Onset  . Hypertension Mother   . Heart disease Mother   . Transient ischemic attack Mother   . CAD Father   . Cancer - Ovarian Sister   . Diabetes Daughter     Social History   Socioeconomic History  . Marital status: Widowed    Spouse name: Not on file  . Number of children: Not on file  . Years of education: Not on file  . Highest education level: Not on file  Occupational History  . Not on file  Social Needs  . Financial resource strain: Not on file  . Food insecurity    Worry: Not on file    Inability: Not on file  . Transportation needs    Medical: Not on file    Non-medical: Not on file  Tobacco Use  . Smoking status: Never Smoker  . Smokeless  tobacco: Never Used  Substance and Sexual Activity  . Alcohol use: No  . Drug use: No  . Sexual activity: Yes  Lifestyle  . Physical activity    Days per week: Not on file    Minutes per session: Not on file  . Stress: Not on file  Relationships  . Social Herbalist on phone: Not on file    Gets together: Not on file    Attends religious service: Not on file    Active member of club or organization: Not on file    Attends meetings of clubs or organizations: Not on file    Relationship status: Not on file  . Intimate partner violence    Fear of current or ex partner: Not on file    Emotionally abused: Not on file    Physically abused: Not on file    Forced sexual activity: Not on file  Other Topics Concern  . Not on file  Social History Narrative   Retired.  Widowed. Husband died 2019-01-21    Outpatient Medications Prior to Visit  Medication Sig Dispense Refill  . Accu-Chek FastClix Lancets MISC For checking blood sugars up to 3 times daily. E11.49 306 each 4  . ACCU-CHEK GUIDE test strip Test twice daily.  DX: E11.49 200 each 6  . aspirin 81 MG chewable tablet Chew 1 tablet by mouth daily.    . blood glucose meter kit and supplies Dispense based on patient and insurance preference. For testing up to twice daily. E11.49 1 each 0  . cetirizine (ZYRTEC) 10 MG tablet Take 10 mg by mouth daily.    . Cholecalciferol (VITAMIN D-3) 1000 UNITS CAPS Take 1,000 Units by mouth 2 (two) times daily.     . dorzolamide (TRUSOPT) 2 % ophthalmic solution Place 1 drop into both eyes 2 (two) times daily.    . hydrochlorothiazide (HYDRODIURIL) 25 MG tablet Take 0.5 tablets by mouth 2 (two) times daily.     . simvastatin (ZOCOR) 80 MG tablet Take 80 mg by mouth at bedtime.    . triamcinolone ointment (KENALOG) 0.5 % Apply 1 application topically 2 (two) times daily as needed. For rash and itching. 30 g 0  . carvedilol (COREG) 3.125 MG tablet Take 3.125 mg by mouth 2 (two) times daily with a meal.    . gabapentin (NEURONTIN) 300 MG capsule Take 300 mg by mouth 2 (two) times daily.    . metFORMIN (GLUCOPHAGE) 500 MG tablet Take by mouth 3 (three) times daily.     No facility-administered medications prior to visit.     Allergies  Allergen Reactions  . Losartan Swelling    Angioedema  . Nsaids Swelling    IBU - tongue swlling.   . Levofloxacin Swelling  . Oxycodone Hcl Swelling    Lip started to swell up  . Verapamil Swelling  . Biaxin [Clarithromycin] Other (See Comments)    Elevated heart rate  . Oxycontin [Oxycodone]     Lips swell  . Vicodin [Hydrocodone-Acetaminophen]     swelling    ROS Review of Systems    Objective:    Physical Exam  Constitutional: She is oriented to person, place, and time. She appears well-developed and well-nourished.   HENT:  Head: Normocephalic and atraumatic.  Right Ear: External ear normal.  Left Ear: External ear normal.  Nose: Nose normal.  Mouth/Throat: Oropharynx is clear and moist.  TMs and canals are clear.   Eyes: Pupils are equal,  round, and reactive to light. Conjunctivae and EOM are normal.  Neck: Neck supple. No thyromegaly present.  Cardiovascular: Normal rate, regular rhythm and normal heart sounds.  Pulmonary/Chest: Effort normal and breath sounds normal. She has no wheezes.  Lymphadenopathy:    She has no cervical adenopathy.  Neurological: She is alert and oriented to person, place, and time.  Skin: Skin is warm and dry.  Psychiatric: She has a normal mood and affect.    BP 139/69   Pulse (!) 53   Ht '5\' 5"'  (1.651 m)   Wt 173 lb (78.5 kg)   SpO2 98%   BMI 28.79 kg/m  Wt Readings from Last 3 Encounters:  06/19/19 173 lb (78.5 kg)  05/22/19 171 lb (77.6 kg)  05/12/19 172 lb (78 kg)     There are no preventive care reminders to display for this patient.  There are no preventive care reminders to display for this patient.  No results found for: TSH Lab Results  Component Value Date   WBC 13.5 (H) 12/05/2013   HGB 9.3 (L) 12/05/2013   HCT 27.1 (L) 12/05/2013   MCV 85.5 12/05/2013   PLT 247 12/05/2013   Lab Results  Component Value Date   NA 138 06/19/2019   K 4.6 06/19/2019   CO2 30 06/19/2019   GLUCOSE 165 (H) 06/19/2019   BUN 23 06/19/2019   CREATININE 1.06 (H) 06/19/2019   BILITOT 0.5 06/19/2019   ALKPHOS 83 03/27/2019   AST 11 06/19/2019   ALT 10 06/19/2019   PROT 7.4 06/19/2019   ALBUMIN 3.7 09/25/2013   CALCIUM 9.7 06/19/2019   Lab Results  Component Value Date   CHOL 120 06/19/2019   Lab Results  Component Value Date   HDL 28 (L) 06/19/2019   Lab Results  Component Value Date   LDLCALC 63 06/19/2019   Lab Results  Component Value Date   TRIG 220 (H) 06/19/2019   Lab Results  Component Value Date   CHOLHDL 4.3 06/19/2019   Lab  Results  Component Value Date   HGBA1C 7.5 (A) 06/19/2019      Assessment & Plan:   Problem List Items Addressed This Visit      Cardiovascular and Mediastinum   Essential hypertension    Doing great on increased dose of carvedilol.  She has been taking 2 tabs what and switch her to the 6.25 mg twice a day.  New prescription sent to pharmacy.  Well controlled. Continue current regimen. Follow up in  3-4 mo.       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  It was 7.9 and she is now down to 7.5.  Just encouraged her to continue to work on some the dietary changes that she is made and try to really work on increasing her activity level.      Relevant Medications   metFORMIN (GLUCOPHAGE) 500 MG tablet   Other Relevant Orders   POCT glycosylated hemoglobin (Hb A1C) (Completed)   POCT UA - Microalbumin (Completed)   COMPLETE METABOLIC PANEL WITH GFR (Completed)   Lipid panel (Completed)    Other Visit Diagnoses    Need for immunization against influenza       Relevant Orders   Flu Vaccine QUAD High Dose(Fluad) (Completed)   Acute non-recurrent maxillary sinusitis       Relevant Medications  amoxicillin-clavulanate (AUGMENTIN) 875-125 MG tablet      Acute sinusitis-we will treat with Augmentin if not better in 1 week and please give [place] call back.  Okay to continue symptomatic care.  Meds ordered this encounter  Medications  . amoxicillin-clavulanate (AUGMENTIN) 875-125 MG tablet    Sig: Take 1 tablet by mouth 2 (two) times daily.    Dispense:  20 tablet    Refill:  0  . gabapentin (NEURONTIN) 300 MG capsule    Sig: Take 1 capsule (300 mg total) by mouth 2 (two) times daily.    Dispense:  180 capsule    Refill:  1  . metFORMIN (GLUCOPHAGE) 500 MG tablet    Sig: Take 1 tablet (500 mg total) by mouth 3 (three) times daily.    Dispense:  270 tablet     Refill:  1  . carvedilol (COREG) 6.25 MG tablet    Sig: Take 1 tablet (6.25 mg total) by mouth 2 (two) times daily with a meal.    Dispense:  180 tablet    Refill:  1    Follow-up: Return in about 3 months (around 09/19/2019) for Hypertension, Diabetes follow-up.    Julia Lecher, MD

## 2019-06-19 NOTE — Assessment & Plan Note (Signed)
A1c is improved.  It was 7.9 and she is now down to 7.5.  Just encouraged her to continue to work on some the dietary changes that she is made and try to really work on increasing her activity level.

## 2019-06-20 ENCOUNTER — Encounter: Payer: Self-pay | Admitting: Family Medicine

## 2019-06-20 LAB — COMPLETE METABOLIC PANEL WITH GFR
AG Ratio: 1.4 (calc) (ref 1.0–2.5)
ALT: 10 U/L (ref 6–29)
AST: 11 U/L (ref 10–35)
Albumin: 4.3 g/dL (ref 3.6–5.1)
Alkaline phosphatase (APISO): 89 U/L (ref 37–153)
BUN/Creatinine Ratio: 22 (calc) (ref 6–22)
BUN: 23 mg/dL (ref 7–25)
CO2: 30 mmol/L (ref 20–32)
Calcium: 9.7 mg/dL (ref 8.6–10.4)
Chloride: 100 mmol/L (ref 98–110)
Creat: 1.06 mg/dL — ABNORMAL HIGH (ref 0.60–0.88)
GFR, Est African American: 57 mL/min/{1.73_m2} — ABNORMAL LOW (ref >=60)
GFR, Est Non African American: 50 mL/min/{1.73_m2} — ABNORMAL LOW (ref >=60)
Globulin: 3.1 g/dL (calc) (ref 1.9–3.7)
Glucose, Bld: 165 mg/dL — ABNORMAL HIGH (ref 65–99)
Potassium: 4.6 mmol/L (ref 3.5–5.3)
Sodium: 138 mmol/L (ref 135–146)
Total Bilirubin: 0.5 mg/dL (ref 0.2–1.2)
Total Protein: 7.4 g/dL (ref 6.1–8.1)

## 2019-06-20 LAB — LIPID PANEL
Cholesterol: 120 mg/dL (ref <200)
HDL: 28 mg/dL — ABNORMAL LOW (ref >=50)
LDL Cholesterol (Calc): 63 mg/dL (calc)
Non-HDL Cholesterol (Calc): 92 mg/dL (calc) (ref <130)
Total CHOL/HDL Ratio: 4.3 (calc) (ref <5.0)
Triglycerides: 220 mg/dL — ABNORMAL HIGH (ref <150)

## 2019-08-05 ENCOUNTER — Other Ambulatory Visit: Payer: Self-pay | Admitting: Family Medicine

## 2019-08-21 ENCOUNTER — Other Ambulatory Visit: Payer: Self-pay

## 2019-08-21 ENCOUNTER — Emergency Department (INDEPENDENT_AMBULATORY_CARE_PROVIDER_SITE_OTHER)
Admission: EM | Admit: 2019-08-21 | Discharge: 2019-08-21 | Disposition: A | Discharge status: Home / Self Care | Payer: Medicare Other | Source: Home / Self Care

## 2019-08-21 DIAGNOSIS — L309 Dermatitis, unspecified: Secondary | ICD-10-CM

## 2019-08-21 MED ORDER — AQUAPHOR EX OINT: TOPICAL_OINTMENT | CUTANEOUS | 0 refills | Status: DC | PRN

## 2019-08-21 MED ORDER — DESOXIMETASONE 0.05 % EX OINT: TOPICAL_OINTMENT | CUTANEOUS | 0 refills | Status: DC

## 2019-08-21 NOTE — ED Provider Notes (Signed)
[name]    CSN: [id number] Arrival date & time: 08/21/19  1320      History   Chief Complaint Chief Complaint  Patient presents with  . Rash    RT hand    HPI [name] is a 78 y.o. female.   Patient complains of a rash on both hands and open sores on her right forearm patient reports she has been using triamcinolone cream with no relief she has tried to get an appointment with the dermatologist but is unable to be seen until March.  Patient states her hands seem to be getting worse with using alcohol gel sanitizer.  Patient has a history of eczema.  The history is provided by the patient.  Rash   Past Medical History:  Diagnosis Date  . Arthritis    "back; left hip; knees" (12/04/2013)  . Asthma   . Chronic bronchitis ([place])    "usually get it twice/yr" (12/04/2013)  . Diabetes mellitus without complication ([place])   . Environmental and seasonal allergies   . GERD (gastroesophageal reflux disease)   . High cholesterol   . Hypertension    pcp   [place]     . Pneumonia    as a child    Patient Active Problem List   Diagnosis Date Noted  . BMI 28.0-28.9,adult 05/01/2019  . Chronic kidney disease (CKD), stage III (moderate) 03/30/2019  . Primary open angle glaucoma (POAG) of left eye, mild stage 11/05/2018  . 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 Presence of orthopedic joint implant 12/03/2013  . 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[place]) 01/18/2012  . DDD (degenerative disc disease), lumbar 01/05/2012  . Osteopenia 07/18/2010  . Asthma in adult 07/18/2010  . Backache 11/01/2009  . Anemia, unspecified 09/14/2009  . VITAMIN D DEFICIENCY 09/02/2009  . HYPERLIPIDEMIA 09/02/2009  . Essential hypertension 09/02/2009  . ALLERGIC RHINITIS 09/02/2009  . REFLUX ESOPHAGITIS 09/02/2009  . URINARY INCONTINENCE, 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    Past Surgical History:   Procedure Laterality Date  . ABDOMINAL HYSTERECTOMY    . BLADDER SUSPENSION    . COLONOSCOPY    . JOINT REPLACEMENT    . SHOULDER ARTHROSCOPY W/ ROTATOR CUFF REPAIR Right   . 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   Procedure: TOTAL HIP ARTHROPLASTY;  Surgeon: Newt Minion, MD;  Location: [facility];  Service: Orthopedics;  Laterality: Left;  Left Total Hip Arthroplasty  . 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   Procedure: TOTAL KNEE ARTHROPLASTY;  Surgeon: Newt Minion, MD;  Location: [facility];  Service: Orthopedics;  Laterality: Right;  Right Total Knee Arthroplasty    OB History   No obstetric history on file.      Home Medications    Prior to Admission medications   Medication Sig Start Date End Date Taking? Authorizing Provider  Accu-Chek FastClix Lancets MISC For checking blood sugars up to 3 times daily. E11.49 05/06/19   Hali Marry, MD  ACCU-CHEK GUIDE test strip Test twice daily.  DX: E11.49 05/28/19   Hali Marry, MD  amoxicillin-clavulanate (AUGMENTIN) 875-125 MG tablet Take 1 tablet by mouth 2 (two) times daily. 06/19/19   Hali Marry, MD  aspirin 81 MG chewable tablet Chew 1 tablet by mouth daily. 07/03/18   [provider]  blood glucose meter kit and supplies Dispense based on patient and insurance preference.  For testing up to twice daily. E11.49 05/01/19   Hali Marry, MD  carvedilol (COREG) 6.25 MG tablet Take 1 tablet (6.25 mg total) by mouth 2 (two) times daily with a meal. 06/19/19   Hali Marry, MD  cetirizine (ZYRTEC) 10 MG tablet Take 10 mg by mouth daily.    [provider]  Cholecalciferol (VITAMIN D-3) 1000 UNITS CAPS Take 1,000 Units by mouth 2 (two) times daily.     [provider]  Desoximetasone (TOPICORT) 0.05 % OINT Apply to hands and arm twice a day 08/21/19   Fransico Meadow, PA-C  dorzolamide (TRUSOPT) 2 % ophthalmic solution Place 1 drop into  both eyes 2 (two) times daily. 03/10/19   [provider]  gabapentin (NEURONTIN) 300 MG capsule Take 1 capsule (300 mg total) by mouth 2 (two) times daily. 06/19/19   Hali Marry, MD  hydrochlorothiazide (HYDRODIURIL) 25 MG tablet TAKE 1 TABLET(25 MG) BY MOUTH DAILY 08/05/19   Hali Marry, MD  metFORMIN (GLUCOPHAGE) 500 MG tablet Take 1 tablet (500 mg total) by mouth 3 (three) times daily. 06/19/19   Hali Marry, MD  mineral oil-hydrophilic petrolatum (AQUAPHOR) ointment Apply topically as needed for dry skin. Apply to hands and arms 4 times a day 08/21/19   Fransico Meadow, PA-C  simvastatin (ZOCOR) 80 MG tablet Take 80 mg by mouth at bedtime.    [provider]  triamcinolone ointment (KENALOG) 0.5 % Apply 1 application topically 2 (two) times daily as needed. For rash and itching. 05/01/19   Hali Marry, MD    Family History Family History  Problem Relation Age of Onset  . Hypertension Mother   . Heart disease Mother   . Transient ischemic attack Mother   . CAD Father   . Cancer - Ovarian Sister   . Diabetes Daughter     Social History Social History   Tobacco Use  . Smoking status: Never Smoker  . Smokeless tobacco: Never Used  Substance Use Topics  . Alcohol use: No  . Drug use: No     Allergies   Losartan, Nsaids, Levofloxacin, Oxycodone hcl, Verapamil, Biaxin [clarithromycin], Oxycontin [oxycodone], and Vicodin [hydrocodone-acetaminophen]   Review of Systems Review of Systems  Skin: Positive for rash.  All other systems reviewed and are negative.    Physical Exam Triage Vital Signs ED Triage Vitals [08/21/19 1359]  Enc Vitals Group     BP 121/75     Pulse Rate 66     Resp 18     Temp 98.6 F (37 C)     Temp Source Oral     SpO2 98 %     Weight      Height      Head Circumference      Peak Flow      Pain Score 0     Pain Loc      Pain Edu?      Excl. in [place]?    No data found.  Updated Vital  Signs BP 121/75 (BP Location: Left Arm)   Pulse 66   Temp 98.6 F (37 C) (Oral)   Resp 18   SpO2 98%   Visual Acuity Right Eye Distance:   Left Eye Distance:   Bilateral Distance:    Right Eye Near:   Left Eye Near:    Bilateral Near:     Physical Exam Vitals signs and nursing note reviewed.  Constitutional:  Appearance: She is well-developed.  HENT:     Head: Normocephalic.  Neck:     Musculoskeletal: Normal range of motion.  Pulmonary:     Effort: Pulmonary effort is normal.  Abdominal:     General: There is no distension.  Musculoskeletal: Normal range of motion.  Skin:    Comments: Bilateral hands erythematous cracked dry skin, cracked dry skin forearms.  Neurological:     Mental Status: She is alert and oriented to person, place, and time.      UC Treatments / Results  Labs (all labs ordered are listed, but only abnormal results are displayed) Labs Reviewed - No data to display  EKG   Radiology No results found.  Procedures Procedures (including critical care time)  Medications Ordered in UC Medications - No data to display  Initial Impression / Assessment and Plan / UC Course  I have reviewed the triage vital signs and the nursing notes.  Pertinent labs & imaging results that were available during my care of the patient were reviewed by me and considered in my medical decision making (see chart for details).     MDM: I will try patient on a stronger topical steroid.  Patient is given a prescription for Topicort and Aquaphor.  Patient is counseled on the importance of moisturizing.  She is advised to follow-up with her primary care physician for recheck. Final Clinical Impressions(s) / UC Diagnoses   Final diagnoses:  Eczema, unspecified type   Discharge Instructions   None    ED Prescriptions    Medication Sig Dispense Auth. Provider   Desoximetasone (TOPICORT) 0.05 % OINT Apply to hands and arm twice a day 60 g Rayni Nemitz K, PA-C    mineral oil-hydrophilic petrolatum (AQUAPHOR) ointment Apply topically as needed for dry skin. Apply to hands and arms 4 times a day 420 g Fransico Meadow, [place]     PDMP not reviewed this encounter.  An After Visit Summary was printed and given to the patient.   Fransico Meadow, [place] 08/21/19 1907

## 2019-08-21 NOTE — ED Triage Notes (Signed)
Pt c/o peeling rash on RT hand x 1 mos. Has tried triamcinalone cream, neosporin and peroxide with no relief. Was previously given the triamcinolone for a rash on her RT elbow from her PCP.

## 2019-09-17 ENCOUNTER — Telehealth: Payer: Self-pay | Admitting: Family Medicine

## 2019-09-17 ENCOUNTER — Ambulatory Visit (INDEPENDENT_AMBULATORY_CARE_PROVIDER_SITE_OTHER): Payer: Medicare Other | Admitting: Nurse Practitioner

## 2019-09-17 ENCOUNTER — Encounter: Payer: Self-pay | Admitting: Nurse Practitioner

## 2019-09-17 DIAGNOSIS — B9689 Other specified bacterial agents as the cause of diseases classified elsewhere: Secondary | ICD-10-CM | POA: Diagnosis not present

## 2019-09-17 DIAGNOSIS — J019 Acute sinusitis, unspecified: Secondary | ICD-10-CM | POA: Diagnosis not present

## 2019-09-17 MED ORDER — AMOXICILLIN-POT CLAVULANATE 875-125 MG PO TABS: 1.0000 | ORAL_TABLET | Freq: Two times a day (BID) | ORAL | 0 refills | Status: DC

## 2019-09-17 MED ORDER — BENZONATATE 200 MG PO CAPS: 200.0000 mg | ORAL_CAPSULE | Freq: Three times a day (TID) | ORAL | 0 refills | Status: DC | PRN

## 2019-09-17 NOTE — Telephone Encounter (Signed)
Please schedule virtual appt with Joy or Sarabeth.

## 2019-09-17 NOTE — Telephone Encounter (Signed)
She will need to be seen. Can offer a virtual w/another provider if she cant come in

## 2019-09-17 NOTE — Patient Instructions (Signed)
I have called in an antibiotic for you. Please complete all of the prescription, even if you feel better after a few doses. You may take this medicine with food if it upsets your stomach.   You can continue taking the tylenol for pain and the delsym for cough. Mucinex may help with your congestion if you would like to try that.   I have also called in the tessalon pearls for your cough.  Please call if you are not feeling better in the next 3 days or if you start running a fever or have worsening symptoms.

## 2019-09-17 NOTE — Telephone Encounter (Signed)
Miss [name] called around 1130 this morning wondering if Dr.Metheney could call her in a script for  her sinus infection without coming in to the office. The number to reach her at is [phone].

## 2019-09-17 NOTE — Progress Notes (Signed)
Virtual Visit via Telephone Note  I connected with [name] on 09/17/19 at  1:00 PM EST by telephone and verified that I am speaking with the correct person using two identifiers.   I discussed the limitations, risks, security and privacy concerns of performing an evaluation and management service by telephone and the availability of in person appointments. I also discussed with the patient that there may be a patient responsible charge related to this service. The patient expressed understanding and agreed to proceed.   Subjective:    CC:  Chief Complaint  Patient presents with  . Sinusitis   HPI: [name] is a pleasant 78 year old female presenting via telephone visit today for sinus pain and pressure for about 2 weeks. She reports increased pressure started in the maxillary sinuses, but improved a little and is now worse in the frontal sinuses. She also reports congestion, rhinitis, and productive cough with yellow mucous. She has been taking tylenol for pain and delsym for cough, which have both been helpful. She has also been taking her regular Flonase at night to help with congestion. Her cough does keep her up some at night. She reports having similar symptoms in September and she was given Augmentin, which cleared the infection.  Past medical history, Surgical history, Family history not pertinant except as noted below, Social history, Allergies, and medications have been entered into the medical record, reviewed, and corrections made.   Review of Systems:  No fevers, chills, night sweats, weight loss, chest pain, or shortness of breath.  No sore throat or difficulty swallowing. No nausea, vomiting, or diarrhea. No loss of taste or smell.   Objective:    General: Speaking clearly in complete sentences without any shortness of breath.  Alert and oriented x3.  Normal judgment. No apparent acute distress. Pt sounds congested.   Telephone visit- pt not  visualized   Impression and Recommendations:    1. Acute bacterial sinusitis Continue tylenol as needed for pain and delsym as needed for cough.   - benzonatate (TESSALON) 200 MG capsule; Take 1 capsule (200 mg total) by mouth 3 (three) times daily as needed for cough.  Dispense: 30 capsule; Refill: 0 - amoxicillin-clavulanate (AUGMENTIN) 875-125 MG tablet; Take 1 tablet by mouth 2 (two) times daily.  Dispense: 20 tablet; Refill: 0    I discussed the assessment and treatment plan with the patient. The patient was provided an opportunity to ask questions and all were answered. The patient agreed with the plan and demonstrated an understanding of the instructions.   The patient was advised to call back or seek an in-person evaluation if the symptoms worsen or if the condition fails to improve as anticipated.  I provided 25 minutes of non-face-to-face time during this encounter.   Orma Render, NP

## 2019-09-22 ENCOUNTER — Other Ambulatory Visit: Payer: Self-pay

## 2019-09-22 ENCOUNTER — Encounter: Payer: Self-pay | Admitting: Family Medicine

## 2019-09-22 ENCOUNTER — Ambulatory Visit (INDEPENDENT_AMBULATORY_CARE_PROVIDER_SITE_OTHER): Payer: Medicare PPO | Admitting: Family Medicine

## 2019-09-22 VITALS — BP 117/62 | HR 57 | Ht 65.0 in | Wt 169.0 lb

## 2019-09-22 DIAGNOSIS — E1149 Type 2 diabetes mellitus with other diabetic neurological complication: Secondary | ICD-10-CM

## 2019-09-22 DIAGNOSIS — I10 Essential (primary) hypertension: Secondary | ICD-10-CM | POA: Diagnosis not present

## 2019-09-22 DIAGNOSIS — J019 Acute sinusitis, unspecified: Secondary | ICD-10-CM | POA: Diagnosis not present

## 2019-09-22 DIAGNOSIS — B9689 Other specified bacterial agents as the cause of diseases classified elsewhere: Secondary | ICD-10-CM | POA: Diagnosis not present

## 2019-09-22 LAB — POCT GLYCOSYLATED HEMOGLOBIN (HGB A1C): Hemoglobin A1C: 7.6 % — AB (ref 4.0–5.6)

## 2019-09-22 MED ORDER — METFORMIN HCL 1000 MG PO TABS: 1000.0000 mg | ORAL_TABLET | Freq: Two times a day (BID) | ORAL | 1 refills | Status: DC

## 2019-09-22 NOTE — Assessment & Plan Note (Signed)
Well controlled. Continue current regimen. Follow up in  3 mo .  

## 2019-09-22 NOTE — Assessment & Plan Note (Signed)
Uncontrolled.  Will increase Metformin to 1000 mg twice a day.  Plan to follow-up in 3 months.  Continue to work on [employer].

## 2019-09-22 NOTE — Progress Notes (Signed)
Established Patient Office Visit  Subjective:  Patient ID: [name], female    DOB: 05-Jan-1941  Age: 79 y.o. MRN: [id number]  CC:  Chief Complaint  Patient presents with  . Diabetes  . Hypertension    HPI STEPHANINE REAS presents for   Hypertension- Pt denies chest pain, SOB, dizziness, or heart palpitations.  Taking meds as directed w/o problems.  Denies medication side effects.    Diabetes - no hypoglycemic events. No wounds or sores that are not healing well. No increased thirst or urination. Checking glucose at home. Taking medications as prescribed without any side effects.  Been taking at 1000 mg in the morning and 500 in the afternoon.  She says her sugars have been running in the 130s and 140s.  With the highest being 145 fasting in the morning.  Sinus sxs - she is on day 6 of her antibiotics and is feeling some better but still a little congested.  She says her cough is actually gotten much better but is not completely resolved.  No fevers.   Past Medical History:  Diagnosis Date  . Arthritis    "back; left hip; knees" (12/04/2013)  . Asthma   . Chronic bronchitis ([place])    "usually get it twice/yr" (12/04/2013)  . Diabetes mellitus without complication ([place])   . Environmental and seasonal allergies   . GERD (gastroesophageal reflux disease)   . High cholesterol   . Hypertension    pcp   [place]     . Pneumonia    as a child    Past Surgical History:  Procedure Laterality Date  . ABDOMINAL HYSTERECTOMY    . BLADDER SUSPENSION    . COLONOSCOPY    . JOINT REPLACEMENT    . SHOULDER ARTHROSCOPY W/ ROTATOR CUFF REPAIR Right   . 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 Hypertension Mother   . Heart disease Mother   . Transient ischemic attack Mother   . CAD Father   . Cancer - Ovarian Sister   . Diabetes Daughter     Social History   Socioeconomic History  . Marital status: Widowed    Spouse name: Not on file  . Number of children: Not on file  . Years of education: Not on file  . Highest education level: Not on file  Occupational History  . Not on file  Tobacco Use  . Smoking status: Never Smoker  . Smokeless tobacco: Never Used  Substance and Sexual Activity  . Alcohol use: No  . Drug use: No  . Sexual activity: Yes  Other Topics Concern  . Not on file  Social History Narrative   Retired. Widowed. Husband died 2019/02/10   Social Determinants of Health   Financial Resource Strain:   . Difficulty of Paying Living Expenses: Not on file  Food Insecurity:   . Worried About Charity fundraiser in the Last Year: Not on file  . Ran Out of Food in the Last Year: Not on file  Transportation Needs:   . Film/video editor (Medical): Not on file  . Lack of Transportation (Non-Medical): Not on file  Physical Activity:   . Days of Exercise per Week: Not on file  . Minutes of Exercise per Session: Not on file  Stress:   . Feeling of Stress : Not on file  Social Connections:   . Frequency of Communication with Friends and Family: Not on file  . Frequency of Social Gatherings with Friends and Family: Not on file  . Attends Religious Services: Not on file  . Active Member of Clubs or Organizations: Not on file  . Attends Archivist Meetings: Not on file  . Marital Status: Not on file  Intimate Partner Violence:   . Fear of Current or Ex-Partner: Not on file  . Emotionally Abused: Not on file  . Physically Abused: Not on file  . Sexually Abused: Not on file     Outpatient Medications Prior to Visit  Medication Sig Dispense Refill  . Accu-Chek FastClix Lancets MISC For checking blood sugars up to 3 times daily. E11.49 306 each 4  . ACCU-CHEK GUIDE test strip Test twice daily.  DX: E11.49 200 each 6  . amoxicillin-clavulanate (AUGMENTIN) 875-125 MG tablet Take 1 tablet by mouth 2 (two) times daily. 20 tablet 0  . aspirin 81 MG chewable tablet Chew 1 tablet by mouth daily.    . benzonatate (TESSALON) 200 MG capsule Take 1 capsule (200 mg total) by mouth 3 (three) times daily as needed for cough. 30 capsule 0  . blood glucose meter kit and supplies Dispense based on patient and insurance preference. For testing up to twice daily. E11.49 1 each 0  . carvedilol (COREG) 6.25 MG tablet Take 1 tablet (6.25 mg total) by mouth 2 (two) times daily with a meal. 180 tablet 1  . cetirizine (ZYRTEC) 10 MG tablet Take 10 mg by mouth daily.    . Cholecalciferol (VITAMIN D-3) 1000 UNITS CAPS Take 1,000 Units by mouth 2 (two) times daily.     . Desoximetasone (TOPICORT) 0.05 % OINT Apply to hands and arm twice a day 60 g 0  . dorzolamide (TRUSOPT) 2 % ophthalmic solution Place 1 drop into both eyes 2 (two) times daily.    Marland Kitchen gabapentin (NEURONTIN) 300 MG capsule Take 1 capsule (300 mg total) by mouth 2 (two) times daily. 180 capsule 1  . hydrochlorothiazide (HYDRODIURIL) 25 MG tablet TAKE 1 TABLET(25 MG) BY MOUTH DAILY 90 tablet 1  . mineral oil-hydrophilic petrolatum (AQUAPHOR) ointment Apply topically as needed for dry skin. Apply to hands and arms 4 times a day 420 g 0  . simvastatin (ZOCOR) 80 MG tablet Take 80 mg by mouth at bedtime.    . triamcinolone ointment (KENALOG) 0.5 % Apply 1 application topically 2 (two) times daily as needed. For rash and itching. 30 g 0  . metFORMIN (GLUCOPHAGE) 500 MG tablet Take 1 tablet (500 mg total) by mouth 3 (three) times daily. 270 tablet 1   No facility-administered medications prior to visit.    Allergies  Allergen  Reactions  . Losartan Swelling    Angioedema  . Nsaids Swelling    IBU - tongue swlling.   . Levofloxacin Swelling  . Oxycodone Hcl Swelling    Lip started to swell up  . Verapamil Swelling  . Biaxin [Clarithromycin] Other (See Comments)    Elevated heart rate  . Oxycontin [Oxycodone]     Lips swell  . Vicodin [Hydrocodone-Acetaminophen]  swelling    ROS Review of Systems    Objective:    Physical Exam  Constitutional: She is oriented to person, place, and time. She appears well-developed and well-nourished.  HENT:  Head: Normocephalic and atraumatic.  Cardiovascular: Normal rate, regular rhythm and normal heart sounds.  Pulmonary/Chest: Effort normal and breath sounds normal.  Neurological: She is alert and oriented to person, place, and time.  Skin: Skin is warm and dry.  Psychiatric: She has a normal mood and affect. Her behavior is normal.    BP 117/62   Pulse (!) 57   Ht '5\' 5"'  (1.651 m)   Wt 169 lb (76.7 kg)   SpO2 100%   BMI 28.12 kg/m  Wt Readings from Last 3 Encounters:  09/22/19 169 lb (76.7 kg)  06/19/19 173 lb (78.5 kg)  05/22/19 171 lb (77.6 kg)     Health Maintenance Due  Topic Date Due  . OPHTHALMOLOGY EXAM  10/10/1948    There are no preventive care reminders to display for this patient.  No results found for: TSH Lab Results  Component Value Date   WBC 13.5 (H) 12/05/2013   HGB 9.3 (L) 12/05/2013   HCT 27.1 (L) 12/05/2013   MCV 85.5 12/05/2013   PLT 247 12/05/2013   Lab Results  Component Value Date   NA 138 06/19/2019   K 4.6 06/19/2019   CO2 30 06/19/2019   GLUCOSE 165 (H) 06/19/2019   BUN 23 06/19/2019   CREATININE 1.06 (H) 06/19/2019   BILITOT 0.5 06/19/2019   ALKPHOS 83 03/27/2019   AST 11 06/19/2019   ALT 10 06/19/2019   PROT 7.4 06/19/2019   ALBUMIN 3.7 09/25/2013   CALCIUM 9.7 06/19/2019   Lab Results  Component Value Date   CHOL 120 06/19/2019   Lab Results  Component Value Date   HDL 28 (L) 06/19/2019    Lab Results  Component Value Date   LDLCALC 63 06/19/2019   Lab Results  Component Value Date   TRIG 220 (H) 06/19/2019   Lab Results  Component Value Date   CHOLHDL 4.3 06/19/2019   Lab Results  Component Value Date   HGBA1C 7.6 (A) 09/22/2019      Assessment & Plan:   Problem List Items Addressed This Visit      Cardiovascular and Mediastinum   Essential hypertension - Primary    Well controlled. Continue current regimen. 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  Will increase Metformin to 1000 mg twice a day.  Plan to follow-up in 3 months.  Continue to work on [facility].      Relevant Medications   metFORMIN (GLUCOPHAGE) 1000 MG tablet   Other Relevant Orders   POCT HgB A1C (Completed)    Other Visit Diagnoses    Acute bacterial sinusitis          Sinusitis - she is improving some.  Still has a few more days antibiotics to complete.  If she is medically steady percent better by then please give [place] a call back.  Meds ordered this encounter  Medications  . metFORMIN (GLUCOPHAGE) 1000 MG tablet    Sig: Take 1 tablet (1,000 mg total) by mouth 2 (two) times daily with a meal.    Dispense:  180 tablet    Refill:  1    Follow-up: Return in about 3 months (around 12/21/2019),  or if symptoms worsen or fail to improve, for Diabetes follow-up.    Beatrice Lecher, MD

## 2019-10-13 ENCOUNTER — Ambulatory Visit: Payer: Medicare PPO | Attending: Internal Medicine

## 2019-10-13 DIAGNOSIS — Z23 Encounter for immunization: Secondary | ICD-10-CM

## 2019-10-13 NOTE — Progress Notes (Signed)
   Covid-19 Vaccination Clinic  Name:  [name]    MRN: [id number] DOB: 1941/08/25  10/13/2019  Ms. Laubscher was observed post Covid-19 immunization for 15 minutes without incidence. She was provided with Vaccine Information Sheet and instruction to access the V-Safe system.   Ms. Humes was instructed to call 911 with any severe reactions post vaccine: Marland Kitchen Difficulty breathing  . Swelling of your face and throat  . A fast heartbeat  . A bad rash all over your body  . Dizziness and weakness    Immunizations Administered    Name Date Dose VIS Date Route   Pfizer COVID-19 Vaccine 10/13/2019  9:41 AM 0.3 mL 08/29/2019 Intramuscular   Manufacturer: [employer], [employer]   Lot: [id number]   NDC: [id number]

## 2019-11-03 ENCOUNTER — Ambulatory Visit: Payer: Medicare PPO | Attending: Internal Medicine

## 2019-11-03 DIAGNOSIS — Z23 Encounter for immunization: Secondary | ICD-10-CM

## 2019-11-03 NOTE — Progress Notes (Signed)
   Covid-19 Vaccination Clinic  Name:  [name]    MRN: [id number] DOB: 03/09/1941  11/03/2019  [name] was observed post Covid-19 immunization for 15 minutes without incidence. She was provided with Vaccine Information Sheet and instruction to access the V-Safe system.   [name] was instructed to call 911 with any severe reactions post vaccine: Marland Kitchen Difficulty breathing  . Swelling of your face and throat  . A fast heartbeat  . A bad rash all over your body  . Dizziness and weakness    Immunizations Administered    Name Date Dose VIS Date Route   Pfizer COVID-19 Vaccine 11/03/2019 10:15 AM 0.3 mL 08/29/2019 Intramuscular   Manufacturer: [employer], [employer]   Lot: [id number]   NDC: [address]-9

## 2019-12-15 ENCOUNTER — Telehealth (INDEPENDENT_AMBULATORY_CARE_PROVIDER_SITE_OTHER): Payer: Medicare PPO | Admitting: Family Medicine

## 2019-12-15 ENCOUNTER — Other Ambulatory Visit: Payer: Self-pay

## 2019-12-15 ENCOUNTER — Encounter: Payer: Self-pay | Admitting: Family Medicine

## 2019-12-15 VITALS — BP 143/71 | Temp 97.0°F | Wt 170.0 lb

## 2019-12-15 DIAGNOSIS — J0130 Acute sphenoidal sinusitis, unspecified: Secondary | ICD-10-CM

## 2019-12-15 MED ORDER — SULFAMETHOXAZOLE-TRIMETHOPRIM 800-160 MG PO TABS: 1.0000 | ORAL_TABLET | Freq: Two times a day (BID) | ORAL | 0 refills | Status: DC

## 2019-12-15 NOTE — Progress Notes (Signed)
Sinus issues since Friday taking tylenol and Flonase nasal spray.  Headache over her eyes that began 2-3 days before the sinus issues began  Drainage to the back of her throat.  Denies any f/s/c/n/v/d.

## 2019-12-15 NOTE — Progress Notes (Signed)
Virtual Visit via Telephone Note  I connected with [name] on 12/15/19 at 11:30 AM EDT by a  telemedicine application and verified that I am speaking with the correct person using two identifiers.   I discussed the limitations of evaluation and management by telemedicine and the availability of in person appointments. The patient expressed understanding and agreed to proceed.  Subjective:    CC: sinus sxs.    HPI:  79 year old female with a history of asthma and diabetes reports that she has not been feeling well since Friday she has had mostly a lot of sinus congestion headache and has started develop a cough.  She feels like it is mostly in the upper throat area she denies any shortness of breath or tightness in the chest.  No loss of taste or smell.  No fevers or chills.  She is using her Flonase regularly which she uses every night.  Has been using a little bit of Tylenol.  She denies any known exposures to Covid.  She said she has received both vaccines.  She has a lot of pain and pressure over her nasal bridge.    Past medical history, Surgical history, Family history not pertinant except as noted below, Social history, Allergies, and medications have been entered into the medical record, reviewed, and corrections made.   Review of Systems: No fevers, chills, night sweats, weight loss, chest pain, or shortness of breath.   Objective:    General: Speaking clearly in complete sentences without any shortness of breath.  Alert and oriented x3.  Normal judgment. No apparent acute distress.    Impression and Recommendations:    No problem-specific Assessment & Plan notes found for this encounter.  Acute sinusitis, sphenoidal-discussed that it still little bit early as she is only had symptoms about 4 days.  She has no known contacts with Covid.  But we did discuss going ahead and putting her on antibiotic if she is not feeling better.  If any point she feels like it is moving  more into her chest.  She is getting more cough or more shortness of breath or chest tightness then please let us know.  Use albuterol as needed since she does have a history of asthma as well.    Time spent in encounter 20 minutes  I discussed the assessment and treatment plan with the patient. The patient was provided an opportunity to ask questions and all were answered. The patient agreed with the plan and demonstrated an understanding of the instructions.   The patient was advised to call back or seek an in-person evaluation if the symptoms worsen or if the condition fails to improve as anticipated.   Nani Gasser, MD

## 2019-12-17 ENCOUNTER — Other Ambulatory Visit: Payer: Self-pay

## 2019-12-17 MED ORDER — GABAPENTIN 300 MG PO CAPS: 300.0000 mg | ORAL_CAPSULE | Freq: Two times a day (BID) | ORAL | 1 refills | Status: DC

## 2019-12-17 MED ORDER — IPRATROPIUM-ALBUTEROL 0.5-2.5 (3) MG/3ML IN SOLN: 3.0000 mL | Freq: Four times a day (QID) | RESPIRATORY_TRACT | 1 refills | Status: DC | PRN

## 2019-12-17 NOTE — Telephone Encounter (Signed)
Patient called requesting refill be sent to [facility] for Duoned.   States this was previously written by Dr Lisa Roca. Use PRN up to QID.  This is not on med list and patient says pharmacy does not have it on file. I have pended RX, please advise.

## 2019-12-22 ENCOUNTER — Ambulatory Visit (INDEPENDENT_AMBULATORY_CARE_PROVIDER_SITE_OTHER): Payer: Medicare PPO | Admitting: Family Medicine

## 2019-12-22 ENCOUNTER — Other Ambulatory Visit: Payer: Self-pay

## 2019-12-22 ENCOUNTER — Encounter: Payer: Self-pay | Admitting: Family Medicine

## 2019-12-22 VITALS — BP 113/56 | HR 53 | Ht 65.0 in | Wt 168.0 lb

## 2019-12-22 DIAGNOSIS — J40 Bronchitis, not specified as acute or chronic: Secondary | ICD-10-CM

## 2019-12-22 DIAGNOSIS — I10 Essential (primary) hypertension: Secondary | ICD-10-CM

## 2019-12-22 DIAGNOSIS — R7989 Other specified abnormal findings of blood chemistry: Secondary | ICD-10-CM

## 2019-12-22 DIAGNOSIS — N1831 Chronic kidney disease, stage 3a: Secondary | ICD-10-CM | POA: Diagnosis not present

## 2019-12-22 DIAGNOSIS — E1149 Type 2 diabetes mellitus with other diabetic neurological complication: Secondary | ICD-10-CM | POA: Diagnosis not present

## 2019-12-22 DIAGNOSIS — J4541 Moderate persistent asthma with (acute) exacerbation: Secondary | ICD-10-CM

## 2019-12-22 DIAGNOSIS — J329 Chronic sinusitis, unspecified: Secondary | ICD-10-CM

## 2019-12-22 LAB — BASIC METABOLIC PANEL WITH GFR
BUN/Creatinine Ratio: 20 (calc) (ref 6–22)
BUN: 28 mg/dL — ABNORMAL HIGH (ref 7–25)
CO2: 25 mmol/L (ref 20–32)
Calcium: 9.8 mg/dL (ref 8.6–10.4)
Chloride: 101 mmol/L (ref 98–110)
Creat: 1.41 mg/dL — ABNORMAL HIGH (ref 0.60–0.88)
GFR, Est African American: 40 mL/min/{1.73_m2} — ABNORMAL LOW (ref >=60)
GFR, Est Non African American: 35 mL/min/{1.73_m2} — ABNORMAL LOW (ref >=60)
Glucose, Bld: 119 mg/dL — ABNORMAL HIGH (ref 65–99)
Potassium: 4.8 mmol/L (ref 3.5–5.3)
Sodium: 136 mmol/L (ref 135–146)

## 2019-12-22 LAB — POCT GLYCOSYLATED HEMOGLOBIN (HGB A1C): Hemoglobin A1C: 7.3 % — AB (ref 4.0–5.6)

## 2019-12-22 MED ORDER — SITAGLIPTIN PHOSPHATE 25 MG PO TABS: 25.0000 mg | ORAL_TABLET | Freq: Every day | ORAL | 2 refills | Status: DC

## 2019-12-22 MED ORDER — AZITHROMYCIN 250 MG PO TABS: ORAL_TABLET | ORAL | 0 refills | Status: AC

## 2019-12-22 MED ORDER — PREDNISONE 20 MG PO TABS: 40.0000 mg | ORAL_TABLET | Freq: Every day | ORAL | 0 refills | Status: DC

## 2019-12-22 NOTE — Assessment & Plan Note (Signed)
Due to recheck renal function. 

## 2019-12-22 NOTE — Assessment & Plan Note (Signed)
Well controlled. Continue current regimen. Follow up in  3 months.  

## 2019-12-22 NOTE — Progress Notes (Signed)
Established Patient Office Visit  Subjective:  Patient ID: [name], female    DOB: [birth year]  Age: 79 y.o. MRN: [id number]  CC:  Chief Complaint  Patient presents with  . Diabetes  . Hypertension    HPI2 [name] presents for   Diabetes - no hypoglycemic events. No wounds or sores that are not healing well. No increased thirst or urination. Checking glucose at home. Taking medications as prescribed without any side effects.  Hypertension- Pt denies chest pain, SOB, dizziness, or heart palpitations.  Taking meds as directed w/o problems.  Denies medication side effects.    She was treated 7 days ago for sinusitis.  She says she feels some better but still has some congestion and is still coughing at night.  No fevers or chills.  She uses Flonase regularly.  She was placed on Bactrim for 7 days. Productive cough.  Has been using her nebulizer and says it does give her some relief for short period of time.  She has been using it 3-4 times a day.  Past Medical History:  Diagnosis Date  . Arthritis    "back; left hip; knees" (12/04/2013)  . Asthma   . Chronic bronchitis ([place])    "usually get it twice/yr" (12/04/2013)  . Diabetes mellitus without complication ([place])   . Environmental and seasonal allergies   . GERD (gastroesophageal reflux disease)   . High cholesterol   . Hypertension    pcp   [facility]     . Pneumonia    as a child    Past Surgical History:  Procedure Laterality Date  . ABDOMINAL HYSTERECTOMY    . BLADDER SUSPENSION    . COLONOSCOPY    . JOINT REPLACEMENT    . SHOULDER ARTHROSCOPY W/ ROTATOR CUFF REPAIR Right   . 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   Procedure: TOTAL HIP ARTHROPLASTY;  Surgeon: Newt Minion, MD;  Location: [facility];  Service: Orthopedics;  Laterality: Left;  Left Total Hip Arthroplasty  . 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   Procedure: TOTAL KNEE ARTHROPLASTY;  Surgeon:  Newt Minion, MD;  Location: [facility];  Service: Orthopedics;  Laterality: Right;  Right Total Knee Arthroplasty    Family History  Problem Relation Age of Onset  . Hypertension Mother   . Heart disease Mother   . Transient ischemic attack Mother   . CAD Father   . Cancer - Ovarian Sister   . Diabetes Daughter     Social History   Socioeconomic History  . Marital status: Widowed    Spouse name: Not on file  . Number of children: Not on file  . Years of education: Not on file  . Highest education level: Not on file  Occupational History  . Not on file  Tobacco Use  . Smoking status: Never Smoker  . Smokeless tobacco: Never Used  Substance and Sexual Activity  . Alcohol use: No  . Drug use: No  . Sexual activity: Yes  Other Topics Concern  . Not on file  Social History Narrative   Retired. Widowed. Husband died 01/21/19   Social Determinants of Health   Financial Resource Strain:   . Difficulty of Paying Living Expenses:   Food Insecurity:   . Worried About Charity fundraiser in the Last Year:   . [place] in the Last Year:   Transportation Needs:   . Lack of  Transportation (Medical):   Marland Kitchen Lack of Transportation (Non-Medical):   Physical Activity:   . Days of Exercise per Week:   . Minutes of Exercise per Session:   Stress:   . Feeling of Stress :   Social Connections:   . Frequency of Communication with Friends and Family:   . Frequency of Social Gatherings with Friends and Family:   . Attends Religious Services:   . Active Member of Clubs or Organizations:   . Attends Archivist Meetings:   Marland Kitchen Marital Status:   Intimate Partner Violence:   . Fear of Current or Ex-Partner:   . Emotionally Abused:   Marland Kitchen Physically Abused:   . Sexually Abused:     Outpatient Medications Prior to Visit  Medication Sig Dispense Refill  . Accu-Chek FastClix Lancets MISC For checking blood sugars up to 3 times daily. E11.49 306 each 4  . ACCU-CHEK GUIDE test  strip Test twice daily.  DX: E11.49 200 each 6  . aspirin 81 MG chewable tablet Chew 1 tablet by mouth daily.    . blood glucose meter kit and supplies Dispense based on patient and insurance preference. For testing up to twice daily. E11.49 1 each 0  . carvedilol (COREG) 6.25 MG tablet Take 1 tablet (6.25 mg total) by mouth 2 (two) times daily with a meal. 180 tablet 1  . cetirizine (ZYRTEC) 10 MG tablet Take 10 mg by mouth daily.    . dorzolamide (TRUSOPT) 2 % ophthalmic solution Place 1 drop into both eyes 2 (two) times daily.    Marland Kitchen gabapentin (NEURONTIN) 300 MG capsule Take 1 capsule (300 mg total) by mouth 2 (two) times daily. 180 capsule 1  . hydrochlorothiazide (HYDRODIURIL) 25 MG tablet TAKE 1 TABLET(25 MG) BY MOUTH DAILY 90 tablet 1  . ipratropium-albuterol (DUONEB) 0.5-2.5 (3) MG/3ML SOLN Take 3 mLs by nebulization every 6 (six) hours as needed. 120 mL 1  . metFORMIN (GLUCOPHAGE) 1000 MG tablet Take 1 tablet (1,000 mg total) by mouth 2 (two) times daily with a meal. 180 tablet 1  . mineral oil-hydrophilic petrolatum (AQUAPHOR) ointment Apply topically as needed for dry skin. Apply to hands and arms 4 times a day 420 g 0  . simvastatin (ZOCOR) 80 MG tablet Take 80 mg by mouth at bedtime.    . sulfamethoxazole-trimethoprim (BACTRIM DS) 800-160 MG tablet Take 1 tablet by mouth 2 (two) times daily. 14 tablet 0   No facility-administered medications prior to visit.    Allergies  Allergen Reactions  . Losartan Swelling    Angioedema  . Nsaids Swelling    IBU - tongue swlling.   . Levofloxacin Swelling  . Oxycodone Hcl Swelling    Lip started to swell up  . Verapamil Swelling  . Biaxin [Clarithromycin] Other (See Comments)    Elevated heart rate  . Oxycontin [Oxycodone]     Lips swell  . Vicodin [Hydrocodone-Acetaminophen]     swelling    ROS Review of Systems    Objective:    Physical Exam  Constitutional: She is oriented to person, place, and time. She appears  well-developed and well-nourished.  HENT:  Head: Normocephalic and atraumatic.  Cardiovascular: Normal rate, regular rhythm and normal heart sounds.  Pulmonary/Chest: Effort normal and breath sounds normal.  + cough sounds wet.    Neurological: She is alert and oriented to person, place, and time.  Skin: Skin is warm and dry.  Psychiatric: She has a normal mood and affect. Her behavior is  normal.    BP (!) 113/56   Pulse (!) 53   Ht _0  (1.651 m)   Wt 168 lb (76.2 kg)   SpO2 97%   BMI 27.96 kg/m  Wt Readings from Last 3 Encounters:  12/22/19 168 lb (76.2 kg)  12/15/19 170 lb (77.1 kg)  09/22/19 169 lb (76.7 kg)     Health Maintenance Due  Topic Date Due  . OPHTHALMOLOGY EXAM  Never done    There are no preventive care reminders to display for this patient.  No results found for: TSH Lab Results  Component Value Date   WBC 13.5 (H) 12/05/2013   HGB 9.3 (L) 12/05/2013   HCT 27.1 (L) 12/05/2013   MCV 85.5 12/05/2013   PLT 247 12/05/2013   Lab Results  Component Value Date   NA 138 06/19/2019   K 4.6 06/19/2019   CO2 30 06/19/2019   GLUCOSE 165 (H) 06/19/2019   BUN 23 06/19/2019   CREATININE 1.06 (H) 06/19/2019   BILITOT 0.5 06/19/2019   ALKPHOS 83 03/27/2019   AST 11 06/19/2019   ALT 10 06/19/2019   PROT 7.4 06/19/2019   ALBUMIN 3.7 09/25/2013   CALCIUM 9.7 06/19/2019   Lab Results  Component Value Date   CHOL 120 06/19/2019   Lab Results  Component Value Date   HDL 28 (L) 06/19/2019   Lab Results  Component Value Date   LDLCALC 63 06/19/2019   Lab Results  Component Value Date   TRIG 220 (H) 06/19/2019   Lab Results  Component Value Date   CHOLHDL 4.3 06/19/2019   Lab Results  Component Value Date   HGBA1C 7.3 (A) 12/22/2019      Assessment & Plan:   Problem List Items Addressed This Visit      Cardiovascular and Mediastinum   Essential hypertension - Primary    Well controlled. Continue current regimen. Follow up in  3 months.        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  We will add Januvia.  Follow-up in 3 months.      Relevant Medications   sitaGLIPtin (JANUVIA) 25 MG tablet   Other Relevant Orders   BASIC METABOLIC PANEL WITH GFR   POCT glycosylated hemoglobin (Hb A1C) (Completed)     Genitourinary   Chronic kidney disease (CKD), stage III (moderate)    Due to recheck renal function.      Relevant Orders   BASIC METABOLIC PANEL WITH GFR    Other Visit Diagnoses    Moderate persistent asthma with exacerbation       Relevant Medications   predniSONE (DELTASONE) 20 MG tablet   Sinobronchitis       Relevant Medications   predniSONE (DELTASONE) 20 MG tablet   azithromycin (ZITHROMAX) 250 MG tablet     Sinobronchitis with asthma exacerbation-she just completed 7 days of Bactrim with only a small improvement.  We will switch to azithromycin.  Even though she has clarithromycin listed as an allergy she says she has taken azithromycin without any problems.  We will also add prednisone.  If not better by the end of the week then please give [place] call back.  Meds ordered this encounter  Medications  . predniSONE (DELTASONE) 20 MG tablet    Sig: Take 2 tablets (40 mg total)  by mouth daily with breakfast.    Dispense:  10 tablet    Refill:  0  . azithromycin (ZITHROMAX) 250 MG tablet    Sig: 2 Ttabs PO on Day 1, then one a day x 4 days.    Dispense:  6 tablet    Refill:  0  . sitaGLIPtin (JANUVIA) 25 MG tablet    Sig: Take 1 tablet (25 mg total) by mouth daily.    Dispense:  30 tablet    Refill:  2    Follow-up: Return in about 3 months (around 03/22/2020) for Diabetes follow-up.    Beatrice Lecher, MD

## 2019-12-22 NOTE — Assessment & Plan Note (Signed)
A1c improved down to 7.3 but still not at goal.  We will add Januvia.  Follow-up in 3 months.

## 2019-12-24 ENCOUNTER — Telehealth: Payer: Self-pay

## 2019-12-24 MED ORDER — VIRTUSSIN A/C 100-10 MG/5ML PO SOLN: 10.0000 mL | Freq: Three times a day (TID) | ORAL | 0 refills | Status: DC | PRN

## 2019-12-24 NOTE — Addendum Note (Signed)
Addended by: Nani Gasser D on: 12/24/2019 08:11 AM   Modules accepted: Orders

## 2019-12-24 NOTE — Telephone Encounter (Signed)
Patient called stating she saw Dr Linford Arnold on Monday and she had a cough, but they were not sure what medication she could take.   Patient said she went home and found a bottle of Virtussin AC that she had taken before and it worked well. She has been up at night coughing and wants to know if an RX can be sent in for this.   Did not pend RX because unsure of dose. This is controlled and will have to be sent by provider.

## 2019-12-24 NOTE — Telephone Encounter (Signed)
Sent to pharmacy 

## 2019-12-25 ENCOUNTER — Other Ambulatory Visit: Payer: Self-pay | Admitting: Neurology

## 2019-12-25 DIAGNOSIS — I10 Essential (primary) hypertension: Secondary | ICD-10-CM

## 2019-12-25 MED ORDER — CARVEDILOL 6.25 MG PO TABS: 6.2500 mg | ORAL_TABLET | Freq: Two times a day (BID) | ORAL | 1 refills | Status: DC

## 2019-12-27 ENCOUNTER — Other Ambulatory Visit: Payer: Self-pay | Admitting: *Deleted

## 2019-12-27 DIAGNOSIS — R7989 Other specified abnormal findings of blood chemistry: Secondary | ICD-10-CM

## 2019-12-27 DIAGNOSIS — R748 Abnormal levels of other serum enzymes: Secondary | ICD-10-CM

## 2019-12-29 DIAGNOSIS — R7989 Other specified abnormal findings of blood chemistry: Secondary | ICD-10-CM | POA: Diagnosis not present

## 2019-12-29 LAB — BASIC METABOLIC PANEL WITH GFR
BUN/Creatinine Ratio: 26 (calc) — ABNORMAL HIGH (ref 6–22)
BUN: 24 mg/dL (ref 7–25)
CO2: 28 mmol/L (ref 20–32)
Calcium: 9.7 mg/dL (ref 8.6–10.4)
Chloride: 97 mmol/L — ABNORMAL LOW (ref 98–110)
Creat: 0.91 mg/dL — ABNORMAL HIGH (ref 0.60–0.88)
GFR, Est African American: 69 mL/min/{1.73_m2} (ref >=60)
GFR, Est Non African American: 59 mL/min/{1.73_m2} — ABNORMAL LOW (ref >=60)
Glucose, Bld: 134 mg/dL (ref 65–139)
Potassium: 4 mmol/L (ref 3.5–5.3)
Sodium: 136 mmol/L (ref 135–146)

## 2020-01-24 ENCOUNTER — Other Ambulatory Visit: Payer: Self-pay | Admitting: Family Medicine

## 2020-01-27 ENCOUNTER — Other Ambulatory Visit: Payer: Self-pay | Admitting: *Deleted

## 2020-01-27 DIAGNOSIS — R21 Rash and other nonspecific skin eruption: Secondary | ICD-10-CM

## 2020-01-27 MED ORDER — TRIAMCINOLONE ACETONIDE 0.5 % EX OINT: 1.0000 "application " | TOPICAL_OINTMENT | Freq: Two times a day (BID) | CUTANEOUS | 1 refills | Status: DC | PRN

## 2020-03-03 ENCOUNTER — Encounter: Payer: Self-pay | Admitting: Sports Medicine

## 2020-03-03 ENCOUNTER — Other Ambulatory Visit: Payer: Self-pay

## 2020-03-03 ENCOUNTER — Ambulatory Visit: Payer: Medicare PPO | Admitting: Sports Medicine

## 2020-03-03 ENCOUNTER — Ambulatory Visit (INDEPENDENT_AMBULATORY_CARE_PROVIDER_SITE_OTHER): Payer: Medicare PPO

## 2020-03-03 DIAGNOSIS — M5030 Other cervical disc degeneration, unspecified cervical region: Secondary | ICD-10-CM | POA: Diagnosis not present

## 2020-03-03 DIAGNOSIS — M542 Cervicalgia: Secondary | ICD-10-CM | POA: Diagnosis not present

## 2020-03-03 IMAGING — DX DG CERVICAL SPINE COMPLETE 4+V
7 series · 7 of 7 positions shown · non-contrast
Comparison: None

CLINICAL DATA: Neck pain and left radiculopathy for 2 days, no
known injury

EXAM:
CERVICAL SPINE - COMPLETE 4+ VIEW

[c-spine lat]
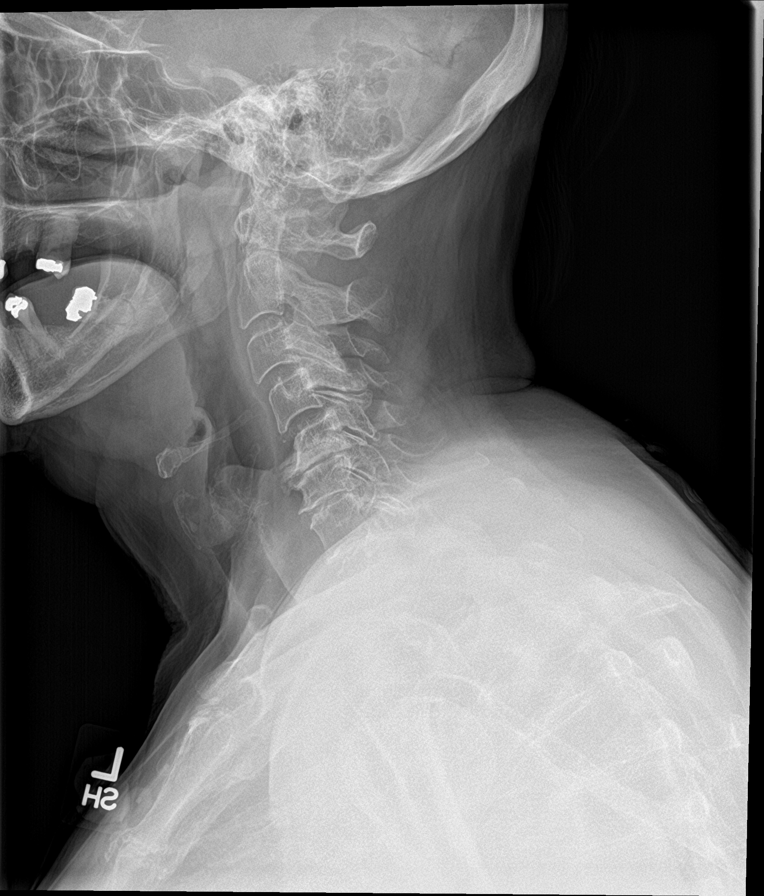

[c-spine obl (1 of 2)]
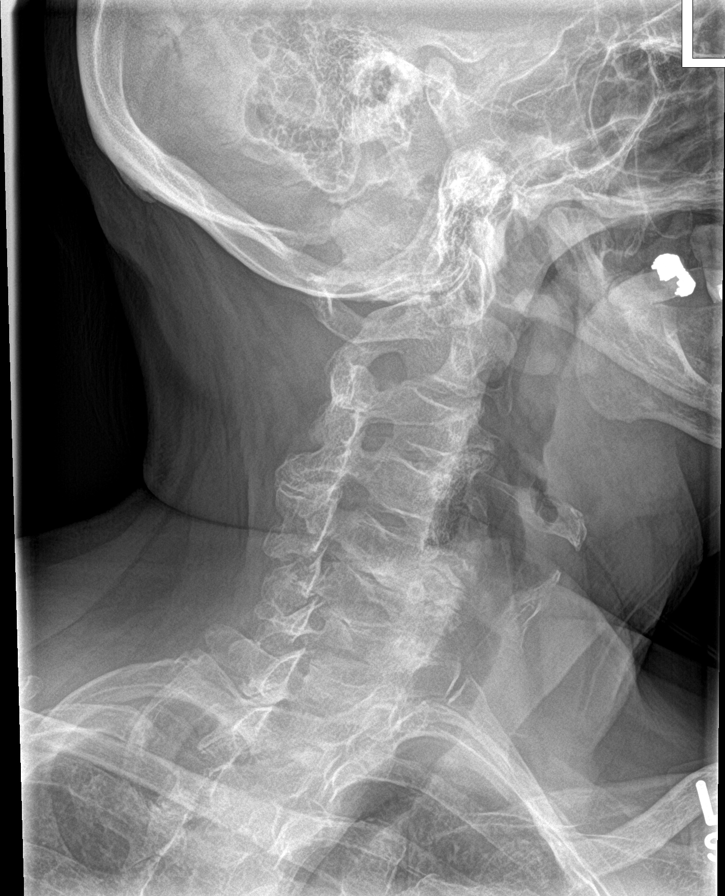

[c-spine obl (2 of 2)]
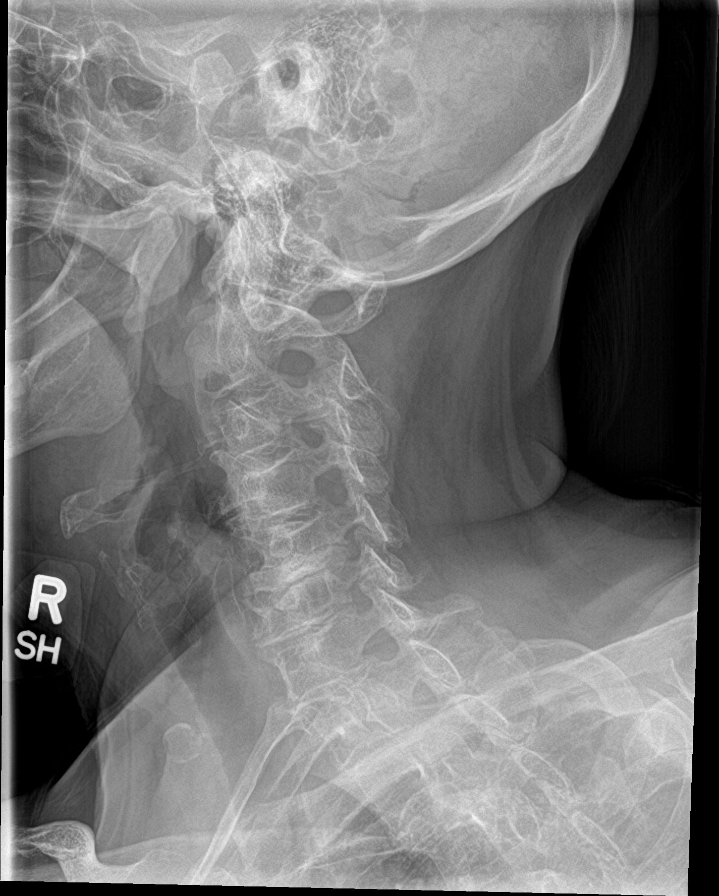

[c-spine ap]
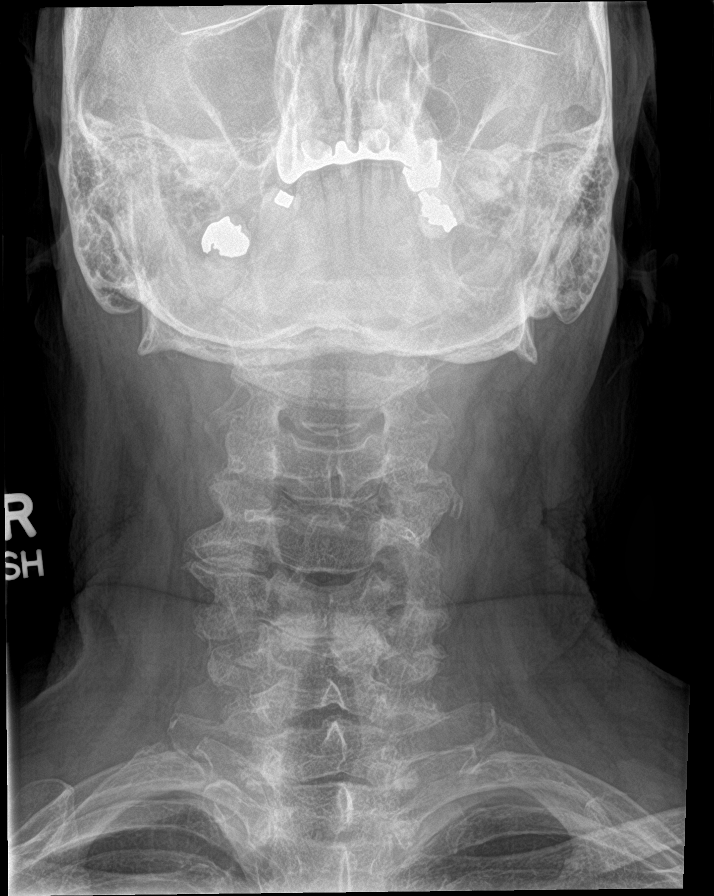

[c-spine open mouth]
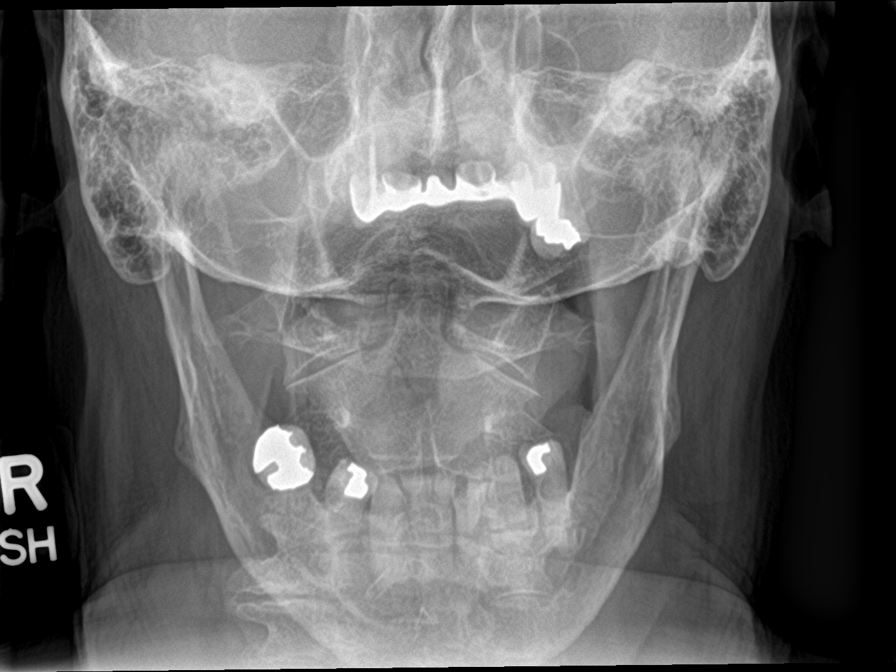

[c-spine swimmers]
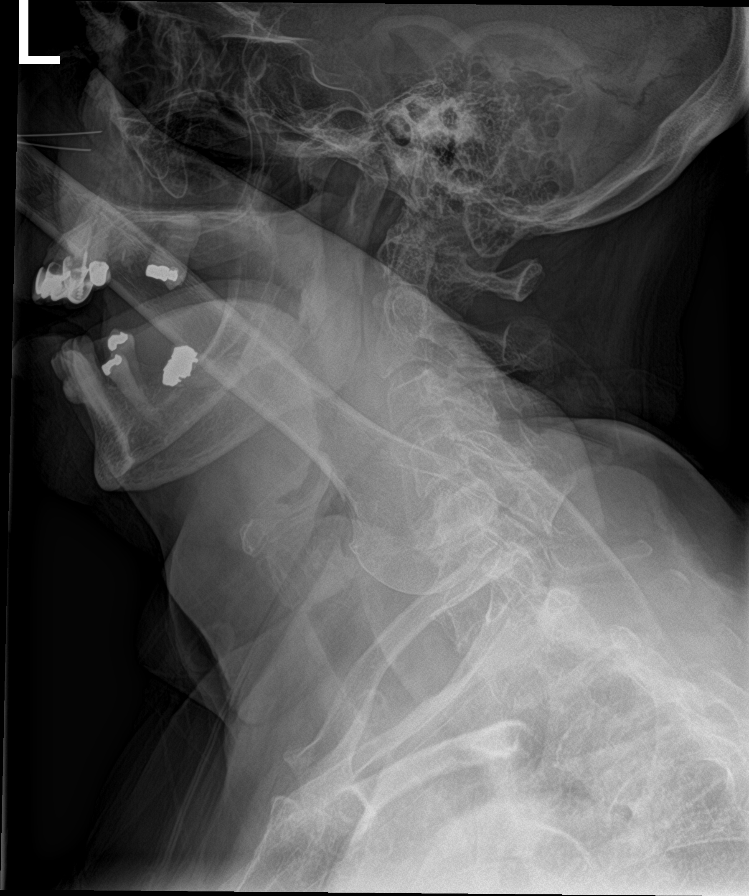

[[person_name]]
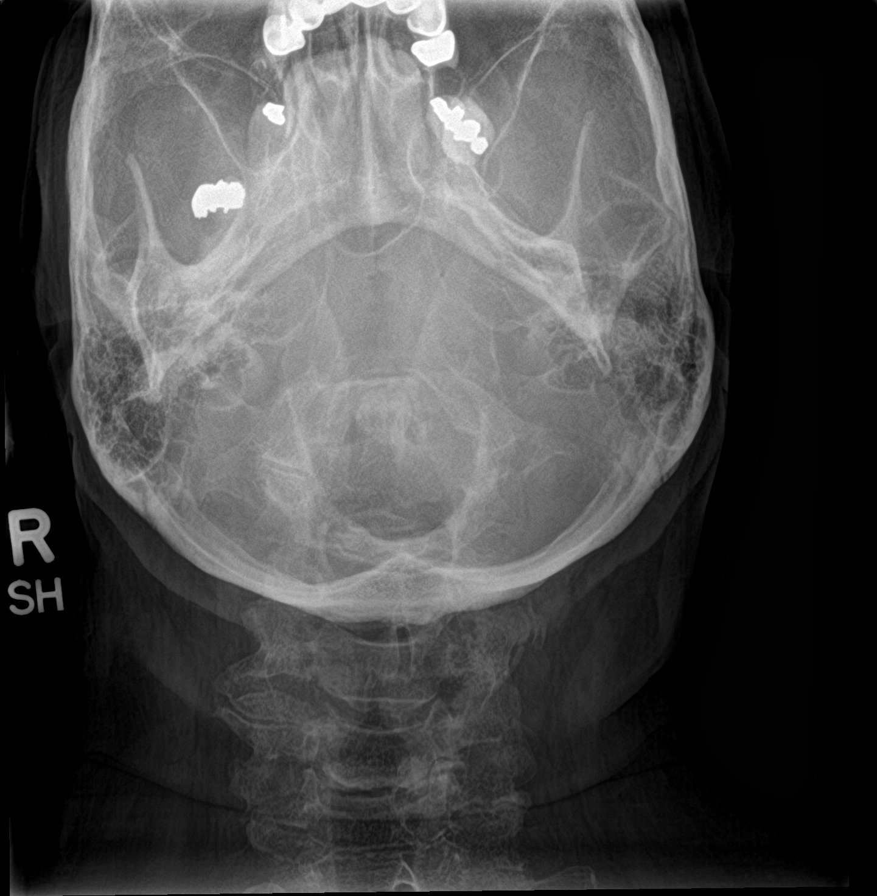

[7 of 7 positions shown; findings below may reference images not displayed]

FINDINGS: Dens is intact. Lateral masses of C1 are well apposed to those of
C2. Mild straightening of the normal cervical lordosis.
Anterolisthesis C4 on C5 and retrolisthesis C5 on C6 of
approximately 3 mm at both levels, favored to be on a spondylitic
basis given endplate changes at this level. No convincing evidence
of acute traumatic listhesis. No acute fracture or vertebral body
height loss. Spondylitic changes of the spine are maximal C5-C7,
most severe at the C5-6 level. Suspect some resulting mild canal
stenosis at this level as well as moderate to severe bilateral
foraminal narrowing at C5-6, more moderate narrowing at C6-7.
Additional mild right foraminal narrowing at C3-4 as well. No
prevertebral swelling or gas. Paravertebral soft tissues are
unremarkable. No acute abnormality in the upper chest or imaged lung
apices. Multitude of absent dentition is noted incidentally.
IMPRESSION: 1. Multilevel degenerative changes of the spine, most severe at the
C5-6 level.
2. Anterolisthesis C4 on C5 and retrolisthesis C5 on C6, favored to
be on a spondylitic basis.
3. No convincing evidence of acute osseous abnormality.

## 2020-03-03 MED ORDER — PREDNISONE 50 MG PO TABS: ORAL_TABLET | ORAL | 0 refills | Status: DC

## 2020-03-03 NOTE — Assessment & Plan Note (Signed)
For several days this pleasant 79 year old female has been holding her yard, she has noted increasing pain in her left periscapular region and occasionally down to her thumb. Worse with prolonged downgaze, turning to the left. X-rays from 2017 at an outside facility showed C5-C6 DDD. I think she has left cervical radiculitis, adding 5 days of prednisone, physical therapy, updated x-rays, return to see me in a month, MRI for interventional planning if no better.

## 2020-03-03 NOTE — Progress Notes (Signed)
    Procedures performed today:    None.  Independent interpretation of notes and tests performed by another provider:   None.  Brief History, Exam, Impression, and Recommendations:    DDD (degenerative disc disease), cervical For several days this pleasant 79 year old female has been holding her yard, she has noted increasing pain in her left periscapular region and occasionally down to her thumb. Worse with prolonged downgaze, turning to the left. X-rays from 2017 at an outside facility showed C5-C6 DDD. I think she has left cervical radiculitis, adding 5 days of prednisone, physical therapy, updated x-rays, return to see me in a month, MRI for interventional planning if no better.    ___________________________________________ Ihor Austin. Benjamin Stain, M.D., ABFM., CAQSM. Primary Care and Sports Medicine [facility]  Adjunct Instructor of Family Medicine  [employer]

## 2020-03-11 ENCOUNTER — Encounter: Payer: Self-pay | Admitting: Physical Therapy

## 2020-03-11 ENCOUNTER — Ambulatory Visit (INDEPENDENT_AMBULATORY_CARE_PROVIDER_SITE_OTHER): Payer: Medicare PPO | Admitting: Physical Therapy

## 2020-03-11 ENCOUNTER — Other Ambulatory Visit: Payer: Self-pay

## 2020-03-11 DIAGNOSIS — M542 Cervicalgia: Secondary | ICD-10-CM | POA: Diagnosis not present

## 2020-03-11 DIAGNOSIS — R293 Abnormal posture: Secondary | ICD-10-CM

## 2020-03-11 NOTE — Therapy (Signed)
[place], [place], [address] Phone: [phone]   Fax:  [phone]  Physical Therapy Evaluation  Patient Details  Name: [name] MRN: [id number] Date of Birth: 1941-09-05 Referring Provider (PT): Dr.Thekkekandam   Encounter Date: 03/11/2020   PT End of Session - 03/11/20 0847    Visit Number 1    Date for PT Re-Evaluation 04/22/20    Authorization Type [employer] medicare    Authorization Time Period 03/11/20-04/22/20    Authorization - Visit Number 1    Authorization - Number of Visits 12    Progress Note Due on Visit 10    PT Start Time 9326    PT Stop Time 0930    PT Time Calculation (min) 43 min    Activity Tolerance Patient tolerated treatment well    Behavior During Therapy [facility] for tasks assessed/performed           Past Medical History:  Diagnosis Date  . Arthritis    "back; left hip; knees" (12/04/2013)  . Asthma   . Chronic bronchitis ([place])    "usually get it twice/yr" (12/04/2013)  . Diabetes mellitus without complication ([place])   . Environmental and seasonal allergies   . GERD (gastroesophageal reflux disease)   . High cholesterol   . Hypertension    pcp   [facility]     . Pneumonia    as a child    Past Surgical History:  Procedure Laterality Date  . ABDOMINAL HYSTERECTOMY    . BLADDER SUSPENSION    . COLONOSCOPY    . JOINT REPLACEMENT    . SHOULDER ARTHROSCOPY W/ ROTATOR CUFF REPAIR Right   . 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   Procedure: TOTAL HIP ARTHROPLASTY;  Surgeon: Newt Minion, MD;  Location: [facility];  Service: Orthopedics;  Laterality: Left;  Left Total Hip Arthroplasty  . 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   Procedure: TOTAL KNEE ARTHROPLASTY;  Surgeon: Newt Minion, MD;  Location: [facility];  Service: Orthopedics;  Laterality: Right;  Right Total Knee Arthroplasty    There were no vitals filed for this  visit.    Subjective Assessment - 03/11/20 0849    Subjective Patient began having left neck pain about 2 weeks ago. Initial pain was into left UT down around shoulder blade. Prednisone has helped. Still having soreness in left shoulder blade.    Pertinent History DM, OA, HTN, low back pain, Rt RCR, Rt TKA, Lt THA    Diagnostic tests xrays - mild central stenosis C5/6; mod to severe foraminal stenosis C5/6 ; anterolisthesis C4/5, retrolisthesis C5/6    Patient Stated Goals Get it all better (no neck/shoulder pain)    Currently in Pain? Yes    Pain Score 2     Pain Location Neck    Pain Orientation Left    Pain Descriptors / Indicators Sore    Pain Type Acute pain    Pain Radiating Towards shoulder blade/UT    Pain Onset 1 to 4 weeks ago    Pain Frequency Constant    Aggravating Factors  unsure    Pain Relieving Factors heat/ice/ Tylenol    Effect of Pain on Daily Activities nagging              [facility] PT Assessment - 03/11/20 0001      Assessment   Medical Diagnosis cervical DDD    Referring Provider (PT) Dr.Thekkekandam  Onset Date/Surgical Date 02/26/20    Hand Dominance Right    Next MD Visit 03/31/20    Prior Therapy no      Precautions   Precautions None      Restrictions   Weight Bearing Restrictions No      Balance Screen   Has the patient fallen in the past 6 months No    Has the patient had a decrease in activity level because of a fear of falling?  No    Is the patient reluctant to leave their home because of a fear of falling?  No      Home Nurse, mental health Private residence    Living Arrangements Alone    Additional Comments ramped entrance one level      Prior Function   Level of Independence Independent    Vocation Retired    Leisure garden      Observation/Other Assessments   Focus on Therapeutic Outcomes (FOTO)  47% limited      Posture/Postural Control   Posture/Postural Control Postural limitations    Postural Limitations  Rounded Shoulders;Increased thoracic kyphosis    Posture Comments elevated Rt shoulder and scapula; right rotation of thoracic spine; stands right trunk lean      ROM / Strength   AROM / PROM / Strength AROM;PROM;Strength      AROM   Overall AROM Comments Bil shoulders WFL    AROM Assessment Site Cervical    Cervical Flexion full    Cervical Extension full    Cervical - Right Side Bend 22    Cervical - Left Side Bend 40   some pain   Cervical - Right Rotation 69    Cervical - Left Rotation 75   some pain     PROM   PROM Assessment Site Cervical    Cervical - Right Side Bend 38      Strength   Strength Assessment Site Cervical    Cervical Flexion 5/5    Cervical Extension 4/5    Cervical - Right Side Bend 5/5    Cervical - Left Side Bend 4/5      Palpation   Spinal mobility WNL some pain at C5/6/7 left, upper thoracic WNL    Palpation comment bil UT, left cervical paraspinals and pectorals, levator and rhomboids      Special Tests   Other special tests neg compression/distraction and spurlings bil                      Objective measurements completed on examination: See above findings.               PT Education - 03/11/20 0946    Education Details HEP    Person(s) Educated Patient    Methods Explanation;Demonstration;Handout    Comprehension Verbalized understanding;Returned demonstration               PT Long Term Goals - 03/11/20 0957      PT LONG TERM GOAL #1   Title Ind with HEP for maintaining good posture.    Time 6    Period Weeks    Status New    Target Date 04/22/20      PT LONG TERM GOAL #2   Title Pt able to perform ADLS without neck/shoulder pain 90% of the time.    Time 6    Period Weeks    Status New      PT LONG TERM GOAL #3  Title Patient to demo 5/5 cervical strength to prevent further injury.    Time 6    Period Weeks    Status New      PT LONG TERM GOAL #4   Title Improved FOTO limitations to <= 33%      Baseline 47% baseline    Time 6    Period Weeks    Status New                  Plan - 03/11/20 0946    Clinical Impression Statement Patient presents with insidious onset of left neck and shoulder pain beginning 2 weeks ago. She responded well to Prednisone and now reports mainly soreness in her left neck and shoulder blade. She is unsure what makes the pain worse other than cervical ROM. She has postural deficits including rounded shoulers, increased thoracic kyphoisis and a forward head. She is active with gardening and also does puzzles at night. She has functional cervical ROM but does have flexibility deficits with left SB and has some some pain with left SB and rot. She also has weakness in her left neck. She will benefit from PT to address these deficits in her neck and return her to painfree ADLS.    Personal Factors and Comorbidities Age;Comorbidity 3+    Comorbidities OA, DM, low back pain    Stability/Clinical Decision Making Stable/Uncomplicated    Clinical Decision Making Low    Rehab Potential Good    PT Frequency 2x / week    PT Duration 6 weeks    PT Treatment/Interventions ADLs/Self Care Home Management;Cryotherapy;Electrical Stimulation;Moist Heat;Therapeutic exercise;Therapeutic activities;Neuromuscular re-education;Manual techniques;Patient/family education;Dry needling;Taping;Spinal Manipulations;Joint Manipulations    PT Next Visit Plan Review HEP; chest opening, postural and cervical strength; DN/manual left neck/UQ    PT Home Exercise Plan [email or web address]    Consulted and Agree with Plan of Care Patient           Patient will benefit from skilled therapeutic intervention in order to improve the following deficits and impairments:  Pain, Impaired flexibility, Postural dysfunction, Decreased strength  Visit Diagnosis: Cervicalgia - Plan: PT plan of care cert/re-cert  Abnormal posture - Plan: PT plan of care cert/re-cert     Problem List Patient Active  Problem List   Diagnosis Date Noted  . DDD (degenerative disc disease), cervical 03/03/2020  . BMI 28.0-28.9,adult 05/01/2019  . Chronic kidney disease (CKD), stage III (moderate) 03/30/2019  . Primary open angle glaucoma (POAG) of left eye, mild stage 11/05/2018  . 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 Presence of orthopedic joint implant 12/03/2013  . 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 DDD (degenerative disc disease), lumbar 01/05/2012  . Osteopenia 07/18/2010  . Asthma in adult 07/18/2010  . Backache 11/01/2009  . Anemia, unspecified 09/14/2009  . VITAMIN D DEFICIENCY 09/02/2009  . HYPERLIPIDEMIA 09/02/2009  . Essential hypertension 09/02/2009  . ALLERGIC RHINITIS 09/02/2009  . REFLUX ESOPHAGITIS 09/02/2009  . 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 [place], [place], [address] Phone: [phone]   Fax:  [phone]  Name: [name] MRN: [id number] Date of Birth: 01-30-1941

## 2020-03-11 NOTE — Patient Instructions (Signed)
Access Code: [email or web address] URL: [email or web address] Date: 03/11/2020 Prepared by: Raynelle Fanning  Exercises Doorway Pec Stretch at 90 Degrees Abduction - 2 x daily - 7 x weekly - 1 sets - 3 reps - 30-60 seconds hold Seated Scapular Retraction - 1 x daily - 7 x weekly - 1-3 sets - 10 reps - 2-3 sec hold Standing Isometric Cervical Sidebending with Manual Resistance - 1 x daily - 7 x weekly - 1 sets - 5 reps - 10 sec hold hold  Patient Education Trigger Point Dry Needling

## 2020-03-15 ENCOUNTER — Other Ambulatory Visit: Payer: Self-pay | Admitting: Neurology

## 2020-03-15 DIAGNOSIS — E1149 Type 2 diabetes mellitus with other diabetic neurological complication: Secondary | ICD-10-CM

## 2020-03-15 MED ORDER — METFORMIN HCL 1000 MG PO TABS: 1000.0000 mg | ORAL_TABLET | Freq: Two times a day (BID) | ORAL | 0 refills | Status: DC

## 2020-03-16 ENCOUNTER — Ambulatory Visit (INDEPENDENT_AMBULATORY_CARE_PROVIDER_SITE_OTHER): Payer: Medicare PPO | Admitting: Physical Therapy

## 2020-03-16 ENCOUNTER — Other Ambulatory Visit: Payer: Self-pay

## 2020-03-16 DIAGNOSIS — M542 Cervicalgia: Secondary | ICD-10-CM

## 2020-03-16 DIAGNOSIS — R293 Abnormal posture: Secondary | ICD-10-CM

## 2020-03-16 NOTE — Therapy (Signed)
[facility] [place] [address] [place], [place], [address] Phone: [phone]   Fax:  [phone]  Physical Therapy Treatment  Patient Details  Name: [name] MRN: [id number] Date of Birth: [birth year] Referring Provider (PT): Dr.Thekkekandam   Encounter Date: 03/16/2020   PT End of Session - 03/16/20 0800    Visit Number 2    Date for PT Re-Evaluation 04/22/20    Authorization Type [employer] medicare    Authorization Time Period 03/11/20-04/22/20    Authorization - Visit Number 2    Authorization - Number of Visits 12    Progress Note Due on Visit 10    PT Start Time 0803    PT Stop Time 0845    PT Time Calculation (min) 42 min    Activity Tolerance Patient tolerated treatment well    Behavior During Therapy [facility] for tasks assessed/performed           Past Medical History:  Diagnosis Date  . Arthritis    "back; left hip; knees" (12/04/2013)  . Asthma   . Chronic bronchitis (HCC)    "usually get it twice/yr" (12/04/2013)  . Diabetes mellitus without complication (HCC)   . Environmental and seasonal allergies   . GERD (gastroesophageal reflux disease)   . High cholesterol   . Hypertension    pcp   [employer]     . Pneumonia    as a child    Past Surgical History:  Procedure Laterality Date  . ABDOMINAL HYSTERECTOMY    . BLADDER SUSPENSION    . COLONOSCOPY    . JOINT REPLACEMENT    . SHOULDER ARTHROSCOPY W/ ROTATOR CUFF REPAIR Right   . 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   Procedure: TOTAL HIP ARTHROPLASTY;  Surgeon: Nadara Mustard, MD;  Location: [facility] OR;  Service: Orthopedics;  Laterality: Left;  Left Total Hip Arthroplasty  . 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   Procedure: TOTAL KNEE ARTHROPLASTY;  Surgeon: Nadara Mustard, MD;  Location: [facility] OR;  Service: Orthopedics;  Laterality: Right;  Right Total Knee Arthroplasty    There were no vitals filed for this  visit.   Subjective Assessment - 03/16/20 0806    Subjective Pt states she is having no pain in neck and shoulders, "just a sore spot" in her Lt shoulder blade area.  She states she has been doing her exercises, "off and on".  She has been picking and canning 1/2 bushels of green beans.    Pertinent History DM, OA, HTN, low back pain, Rt RCR, Rt TKA, Lt THA    Diagnostic tests xrays - mild central stenosis C5/6; mod to severe foraminal stenosis C5/6 ; anterolisthesis C4/5, retrolisthesis C5/6    Patient Stated Goals Get it all better (no neck/shoulder pain)    Currently in Pain? Yes    Pain Score 1     Pain Location Shoulder    Pain Orientation Left    Pain Descriptors / Indicators Sore    Pain Onset 1 to 4 weeks ago    Aggravating Factors  overdoing it    Pain Relieving Factors heat, ice, Tylenol    Multiple Pain Sites Yes    Pain Score 4    Pain Location Hip              [facility] PT Assessment - 03/16/20 0001      Assessment   Medical Diagnosis cervical DDD    Referring  Provider (PT) Dr.Thekkekandam    Onset Date/Surgical Date 02/26/20    Hand Dominance Right    Next MD Visit 03/31/20    Prior Therapy no      AROM   Cervical - Right Side Bend 20    Cervical - Left Side Bend 28           [facility] Adult PT Treatment/Exercise - 03/16/20 0001      Self-Care   Self-Care Other Self-Care Comments    Other Self-Care Comments  Pt educated on self massage with ball to post shoulder and hip to decrease fascial tightness and decrease pain. Pt returned demo with cues.       Neck Exercises: Seated   Cervical Isometrics Right lateral flexion;Left lateral flexion;10 secs;5 reps    W Back 5 reps   5 sec hold   Shoulder Rolls 5 reps;Backwards    Other Seated Exercise scap squeeze x 5 sec x 10 reps;  seated row, 3 sec pause in retraction x 10 reps with red band      Shoulder Exercises: Supine   Other Supine Exercises active snow angel, range to tolerance x 5       Shoulder Exercises:  Sidelying   Other Sidelying Exercises open book in Rt/Lt sidelying x 5 reps, range to tolerance      Shoulder Exercises: Standing   Other Standing Exercises scap squeeze against noodle x 5 sec x 5 reps      Shoulder Exercises: Stretch   Other Shoulder Stretches low and midlevel doorway stretch x 15 sec x     Other Shoulder Stretches Rt/Lt hamstring stretch in supine with strap (20 sec each, 2 reps) Rt piriformis stretch in supine x 20 sec x 2 reps (limited mobility in Rt hip, PTA assist)      Manual Therapy   Manual Therapy Soft tissue mobilization    Manual therapy comments to decrease fascial restrictions and improve mobility.     Soft tissue mobilization STM to cervical paraspinals, Lt upper trap, levator, scalenes, rhomboid, and bilat suboccipital release.       Neck Exercises: Stretches   Upper Trapezius Stretch Right;Left;3 reps;10 seconds   limited range, cues to tuck chin                      PT Long Term Goals - 03/11/20 0957      PT LONG TERM GOAL #1   Title Ind with HEP for maintaining good posture.    Time 6    Period Weeks    Status New    Target Date 04/22/20      PT LONG TERM GOAL #2   Title Pt able to perform ADLS without neck/shoulder pain 90% of the time.    Time 6    Period Weeks    Status New      PT LONG TERM GOAL #3   Title Patient to demo 5/5 cervical strength to prevent further injury.    Time 6    Period Weeks    Status New      PT LONG TERM GOAL #4   Title Improved FOTO limitations to <= 33%    Baseline 47% baseline    Time 6    Period Weeks    Status New                 Plan - 03/16/20 0944    Clinical Impression Statement Pt's pain in neck/ Lt shoulder mostly  resolved now.  Her neck remains tight with lateral flexion.  Limited standing exercises due to Rt hip pain, trialed LE stretches for improved overall mobility and posture.  Pt tolerated all exercises well, without increase in pain.  Progressing well towards goals.     Rehab Potential Good    PT Frequency 2x / week    PT Duration 6 weeks    PT Treatment/Interventions ADLs/Self Care Home Management;Cryotherapy;Electrical Stimulation;Moist Heat;Therapeutic exercise;Therapeutic activities;Neuromuscular re-education;Manual techniques;Patient/family education;Dry needling;Taping;Spinal Manipulations;Joint Manipulations    PT Next Visit Plan manual therapy to neck/UQ.  continue postural strengthening/ flexibility.    PT Home Exercise Plan [email or web address]           Patient will benefit from skilled therapeutic intervention in order to improve the following deficits and impairments:  Pain, Impaired flexibility, Postural dysfunction, Decreased strength  Visit Diagnosis: Cervicalgia  Abnormal posture     Problem List Patient Active Problem List   Diagnosis Date Noted  . DDD (degenerative disc disease), cervical 03/03/2020  . BMI 28.0-28.9,adult 05/01/2019  . Chronic kidney disease (CKD), stage III (moderate) 03/30/2019  . Primary open angle glaucoma (POAG) of left eye, mild stage 11/05/2018  . 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 Presence of orthopedic joint implant 12/03/2013  . 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 DDD (degenerative disc disease), lumbar 01/05/2012  . Osteopenia 07/18/2010  . Asthma in adult 07/18/2010  . Backache 11/01/2009  . Anemia, unspecified 09/14/2009  . VITAMIN D DEFICIENCY 09/02/2009  . HYPERLIPIDEMIA 09/02/2009  . Essential hypertension 09/02/2009  . ALLERGIC RHINITIS 09/02/2009  . REFLUX ESOPHAGITIS 09/02/2009  . URINARY INCONTINENCE, 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   Mayer Camel, PTA 03/16/20 9:56 AM  [facility] [address] [place], [place], [address] Phone: [phone]   Fax:  [phone]  Name: [name] MRN: [id number] Date of Birth:  04-Aug-1941

## 2020-03-18 ENCOUNTER — Encounter: Payer: Self-pay | Admitting: Physical Therapy

## 2020-03-18 ENCOUNTER — Ambulatory Visit (INDEPENDENT_AMBULATORY_CARE_PROVIDER_SITE_OTHER): Payer: Medicare PPO | Admitting: Physical Therapy

## 2020-03-18 ENCOUNTER — Other Ambulatory Visit: Payer: Self-pay

## 2020-03-18 DIAGNOSIS — R293 Abnormal posture: Secondary | ICD-10-CM | POA: Diagnosis not present

## 2020-03-18 DIAGNOSIS — M542 Cervicalgia: Secondary | ICD-10-CM | POA: Diagnosis not present

## 2020-03-18 NOTE — Patient Instructions (Signed)
Over Head Pull: Narrow Grip     [place] [phone]   On back, knees bent, feet flat, band across thighs, elbows straight but relaxed. Pull hands apart (start). Keeping elbows straight, bring arms up and over head, hands toward floor. Keep pull steady on band. Hold momentarily. Return slowly, keeping pull steady, back to start. Repeat __10_ times. Band color __red ____   Side Pull: Double Arm   On back, knees bent, feet flat. Arms perpendicular to body, shoulder level, elbows straight but relaxed. Pull arms out to sides, elbows straight. Resistance band comes across collarbones, hands toward floor. Hold momentarily. Slowly return to starting position. Repeat _10__ times. Band color __red__   Sash   On back, knees bent, feet flat, left hand on left hip, right hand above left. Pull right arm DIAGONALLY (hip to shoulder) across chest. Bring right arm along head toward floor. Hold momentarily. Slowly return to starting position. Repeat _10__ times. Do with left arm. Band color __red____   Shoulder Rotation: Double Arm   On back, knees bent, feet flat, elbows tucked at sides, bent 90, hands palms up. Pull hands apart and down toward floor, keeping elbows near sides. Hold momentarily. Slowly return to starting position. Repeat _10__ times. Band color ___red___    

## 2020-03-18 NOTE — Therapy (Signed)
[facility] [place] [address] [place], [place], [address] Phone: [phone]   Fax:  [phone]  Physical Therapy Treatment  Patient Details  Name: [name] MRN: [id number] Date of Birth: Sep 05, 1941 Referring Provider (PT): Dr.Thekkekandam   Encounter Date: 03/18/2020   PT End of Session - 03/18/20 1441    Visit Number 3    Date for PT Re-Evaluation 04/22/20    Authorization Type [employer] medicare    Authorization Time Period 03/11/20-04/22/20    Authorization - Visit Number 3    Authorization - Number of Visits 12    Progress Note Due on Visit 10    PT Start Time 1443    Activity Tolerance Patient tolerated treatment well    Behavior During Therapy [facility] for tasks assessed/performed           Past Medical History:  Diagnosis Date   Arthritis    "back; left hip; knees" (12/04/2013)   Asthma    Chronic bronchitis (HCC)    "usually get it twice/yr" (12/04/2013)   Diabetes mellitus without complication (HCC)    Environmental and seasonal allergies    GERD (gastroesophageal reflux disease)    High cholesterol    Hypertension    pcp   [employer]      Pneumonia    as a child    Past Surgical History:  Procedure Laterality Date   ABDOMINAL HYSTERECTOMY     BLADDER SUSPENSION     COLONOSCOPY     JOINT REPLACEMENT     SHOULDER ARTHROSCOPY W/ ROTATOR CUFF REPAIR Right    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   Procedure: TOTAL HIP ARTHROPLASTY;  Surgeon: Nadara Mustard, MD;  [facility]: [facility] OR;  Service: Orthopedics;  Laterality: Left;  Left Total Hip Arthroplasty   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   Procedure: TOTAL KNEE ARTHROPLASTY;  Surgeon: Nadara Mustard, MD;  Location: [facility] OR;  Service: Orthopedics;  Laterality: Right;  Right Total Knee Arthroplasty    There were no vitals filed for this visit.   Subjective Assessment - 03/18/20 1442     Subjective Pt reports her neck/shoulder "just a tad".  She believes she irritated it with picking more beans from garden this morning. Last night it was bugging her, but she resolved it with ice.    Pertinent History DM, OA, HTN, low back pain, Rt RCR, Rt TKA, Lt THA    Diagnostic tests xrays - mild central stenosis C5/6; mod to severe foraminal stenosis C5/6 ; anterolisthesis C4/5, retrolisthesis C5/6    Patient Stated Goals Get it all better (no neck/shoulder pain)    Currently in Pain? Yes    Pain Score 1     Pain Location Shoulder    Pain Orientation Left;Upper    Pain Descriptors / Indicators Sore              [facility] PT Assessment - 03/18/20 0001      Assessment   Medical Diagnosis cervical DDD    Referring Provider (PT) Dr.Thekkekandam    Onset Date/Surgical Date 02/26/20    Hand Dominance Right    Next MD Visit 03/31/20    Prior Therapy no            [facility] Adult PT Treatment/Exercise - 03/18/20 0001      Neck Exercises: Supine   Cervical Rotation Right;Left;5 reps      Shoulder Exercises: Supine   Horizontal ABduction Strengthening;Both;10  reps    Theraband Level (Shoulder Horizontal ABduction) Level 2 (Red)    External Rotation Strengthening;Both;10 reps    Theraband Level (Shoulder External Rotation) Level 2 (Red)    Flexion Strengthening;Both;10 reps    Theraband Level (Shoulder Flexion) Level 2 (Red)    Diagonals Strengthening;Right;Left;10 reps    Theraband Level (Shoulder Diagonals) Level 2 (Red)    Other Supine Exercises active snow angel, range to tolerance x 5     Other Supine Exercises head press x 5 sec, thoracic lift x 5 sec x 5 reps, then combined x 5 sec x 5 rep      Shoulder Exercises: Sidelying   Other Sidelying Exercises open book in Rt/Lt sidelying x 5 reps, range to tolerance      Shoulder Exercises: Stretch   Other Shoulder Stretches low and midlevel doorway stretch x 15 sec x     Other Shoulder Stretches prolonged stretch with arms in T x 30  sec x x2 reps      Modalities   Modalities Moist Heat      Moist Heat Therapy   Number Minutes Moist Heat 10 Minutes    Moist Heat Location Cervical   upper back     Manual Therapy   Manual Therapy Soft tissue mobilization    Manual therapy comments to decrease fascial restrictions and improve mobility.     Soft tissue mobilization STM to cervical paraspinals, Lt upper trap, levator, scalenes, rhomboid, and bilat suboccipital release.                   PT Education - 03/18/20 1526    Education Details HEP - issued red band    Person(s) Educated Patient    Methods Explanation;Demonstration;Handout    Comprehension Returned demonstration;Verbalized understanding               PT Long Term Goals - 03/18/20 1524      PT LONG TERM GOAL #1   Title Ind with HEP for maintaining good posture.    Time 6    Period Weeks    Status On-going      PT LONG TERM GOAL #2   Title Pt able to perform ADLS without neck/shoulder pain 90% of the time.    Baseline 50% improvement reported.    Time 6    Period Weeks    Status On-going      PT LONG TERM GOAL #3   Title Patient to demo 5/5 cervical strength to prevent further injury.    Time 6    Period Weeks    Status On-going      PT LONG TERM GOAL #4   Title Improved FOTO limitations to <= 33%    Baseline 47% baseline    Time 6    Period Weeks    Status On-going                 Plan - 03/18/20 1527    Clinical Impression Statement Pt reporting 50% improvement in symptoms with ADLs.  Palpable tightness in Lt rhomboid, upper thoracic paraspinals, levator; improved with STM.  Pt tolerated supine scap stabilization exercises well; added to HEP. Pt will benefit from continued PT intervention to max functional mobility with less pain.    Rehab Potential Good    PT Frequency 2x / week    PT Duration 6 weeks    PT Treatment/Interventions ADLs/Self Care Home Management;Cryotherapy;Electrical Stimulation;Moist  Heat;Therapeutic exercise;Therapeutic activities;Neuromuscular re-education;Manual techniques;Patient/family education;Dry needling;Taping;Spinal Manipulations;Joint Manipulations  PT Next Visit Plan manual therapy to neck/UQ.  continue postural strengthening/ flexibility.    PT Home Exercise Plan [email or web address]    Consulted and Agree with Plan of Care Patient           Patient will benefit from skilled therapeutic intervention in order to improve the following deficits and impairments:  Pain, Impaired flexibility, Postural dysfunction, Decreased strength  Visit Diagnosis: Cervicalgia  Abnormal posture     Problem List Patient Active Problem List   Diagnosis Date Noted   DDD (degenerative disc disease), cervical 03/03/2020   BMI 28.0-28.9,adult 05/01/2019   Chronic kidney disease (CKD), stage III (moderate) 03/30/2019   Primary open angle glaucoma (POAG) of left eye, mild stage 11/05/2018   Combined forms of age-related cataract of right eye 12/31/2014   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   Presence of orthopedic joint implant 12/03/2013   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 [place], [place], [address] Phone: [phone]   Fax:  [phone]  Name: [name] MRN: [id number] Date of Birth: 1941-08-10

## 2020-03-23 ENCOUNTER — Ambulatory Visit (INDEPENDENT_AMBULATORY_CARE_PROVIDER_SITE_OTHER): Payer: Medicare PPO | Admitting: Family Medicine

## 2020-03-23 ENCOUNTER — Encounter: Payer: Self-pay | Admitting: Family Medicine

## 2020-03-23 ENCOUNTER — Other Ambulatory Visit: Payer: Self-pay

## 2020-03-23 VITALS — BP 125/54 | HR 57 | Ht 65.0 in | Wt 167.0 lb

## 2020-03-23 DIAGNOSIS — J4520 Mild intermittent asthma, uncomplicated: Secondary | ICD-10-CM | POA: Diagnosis not present

## 2020-03-23 DIAGNOSIS — E785 Hyperlipidemia, unspecified: Secondary | ICD-10-CM

## 2020-03-23 DIAGNOSIS — N1831 Chronic kidney disease, stage 3a: Secondary | ICD-10-CM | POA: Diagnosis not present

## 2020-03-23 DIAGNOSIS — I10 Essential (primary) hypertension: Secondary | ICD-10-CM

## 2020-03-23 DIAGNOSIS — E1149 Type 2 diabetes mellitus with other diabetic neurological complication: Secondary | ICD-10-CM

## 2020-03-23 LAB — POCT GLYCOSYLATED HEMOGLOBIN (HGB A1C): Hemoglobin A1C: 7.4 % — AB (ref 4.0–5.6)

## 2020-03-23 MED ORDER — SIMVASTATIN 80 MG PO TABS: 80.0000 mg | ORAL_TABLET | Freq: Every day | ORAL | 3 refills | Status: DC

## 2020-03-23 NOTE — Progress Notes (Signed)
Established Patient Office Visit  Subjective:  Patient ID: [name], female    DOB: 10-19-1940  Age: 79 y.o. MRN: [id number]  CC:  Chief Complaint  Patient presents with  . Diabetes  . Hypertension    HPI [name] presents for   Hypertension- Pt denies chest pain, SOB, dizziness, or heart palpitations.  Taking meds as directed w/o problems.  Denies medication side effects.    Diabetes - no hypoglycemic events. No wounds or sores that are not healing well. No increased thirst or urination. Checking glucose at home. Taking medications as prescribed without any side effects.  Says she is about out of her metformin though it looks like we actually sent refills a week ago.  Reports that she goes to eye care center for her yearly eye exam.  Says f/u scheduled for the fall.  She is still been active and getting out in her garden.  She has been having some issues with her pharmacy.  Past Medical History:  Diagnosis Date  . Arthritis    "back; left hip; knees" (12/04/2013)  . Asthma   . Chronic bronchitis ([place])    "usually get it twice/yr" (12/04/2013)  . Diabetes mellitus without complication ([place])   . Environmental and seasonal allergies   . GERD (gastroesophageal reflux disease)   . High cholesterol   . Hypertension    pcp   [place]     . Pneumonia    as a child    Past Surgical History:  Procedure Laterality Date  . ABDOMINAL HYSTERECTOMY    . BLADDER SUSPENSION    . COLONOSCOPY    . JOINT REPLACEMENT    . SHOULDER ARTHROSCOPY W/ ROTATOR CUFF REPAIR Right   . 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   Procedure: TOTAL HIP ARTHROPLASTY;  Surgeon: Newt Minion, MD;  Location: [facility];  Service: Orthopedics;  Laterality: Left;  Left Total Hip Arthroplasty  . 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   Procedure: TOTAL KNEE ARTHROPLASTY;  Surgeon: Newt Minion, MD;  Location: [facility];  Service: Orthopedics;  Laterality:  Right;  Right Total Knee Arthroplasty    Family History  Problem Relation Age of Onset  . Hypertension Mother   . Heart disease Mother   . Transient ischemic attack Mother   . CAD Father   . Cancer - Ovarian Sister   . Diabetes Daughter     Social History   Socioeconomic History  . Marital status: Widowed    Spouse name: Not on file  . Number of children: Not on file  . Years of education: Not on file  . Highest education level: Not on file  Occupational History  . Not on file  Tobacco Use  . Smoking status: Never Smoker  . Smokeless tobacco: Never Used  Substance and Sexual Activity  . Alcohol use: No  . Drug use: No  . Sexual activity: Yes  Other Topics Concern  . Not on file  Social History Narrative   Retired. Widowed. Husband died 2019-01-15   Social Determinants of Health   Financial Resource Strain:   . Difficulty of Paying Living Expenses:   Food Insecurity:   . Worried About Charity fundraiser in the Last Year:   . Arboriculturist in the Last Year:   Transportation Needs:   . Film/video editor (Medical):   Marland Kitchen Lack of Transportation (Non-Medical):   Physical Activity:   .  Days of Exercise per Week:   . Minutes of Exercise per Session:   Stress:   . Feeling of Stress :   Social Connections:   . Frequency of Communication with Friends and Family:   . Frequency of Social Gatherings with Friends and Family:   . Attends Religious Services:   . Active Member of Clubs or Organizations:   . Attends Archivist Meetings:   Marland Kitchen Marital Status:   Intimate Partner Violence:   . Fear of Current or Ex-Partner:   . Emotionally Abused:   Marland Kitchen Physically Abused:   . Sexually Abused:     Outpatient Medications Prior to Visit  Medication Sig Dispense Refill  . Accu-Chek FastClix Lancets MISC For checking blood sugars up to 3 times daily. E11.49 306 each 4  . ACCU-CHEK GUIDE test strip Test twice daily.  DX: E11.49 200 each 6  . aspirin 81 MG chewable  tablet Chew 1 tablet by mouth daily.    . blood glucose meter kit and supplies Dispense based on patient and insurance preference. For testing up to twice daily. E11.49 1 each 0  . carvedilol (COREG) 6.25 MG tablet Take 1 tablet (6.25 mg total) by mouth 2 (two) times daily with a meal. 180 tablet 1  . cetirizine (ZYRTEC) 10 MG tablet Take 10 mg by mouth daily.    . dorzolamide (TRUSOPT) 2 % ophthalmic solution Place 1 drop into both eyes 2 (two) times daily.    Marland Kitchen gabapentin (NEURONTIN) 300 MG capsule Take 1 capsule (300 mg total) by mouth 2 (two) times daily. 180 capsule 1  . hydrochlorothiazide (HYDRODIURIL) 25 MG tablet TAKE 1 TABLET(25 MG) BY MOUTH DAILY 90 tablet 1  . ipratropium-albuterol (DUONEB) 0.5-2.5 (3) MG/3ML SOLN Take 3 mLs by nebulization every 6 (six) hours as needed. 120 mL 1  . metFORMIN (GLUCOPHAGE) 1000 MG tablet Take 1 tablet (1,000 mg total) by mouth 2 (two) times daily with a meal. 180 tablet 0  . mineral oil-hydrophilic petrolatum (AQUAPHOR) ointment Apply topically as needed for dry skin. Apply to hands and arms 4 times a day 420 g 0  . sitaGLIPtin (JANUVIA) 25 MG tablet Take 1 tablet (25 mg total) by mouth daily. 30 tablet 2  . triamcinolone ointment (KENALOG) 0.5 % Apply 1 application topically 2 (two) times daily as needed. For rash and itching. 30 g 1  . simvastatin (ZOCOR) 80 MG tablet Take 80 mg by mouth at bedtime.    . predniSONE (DELTASONE) 50 MG tablet One tab PO daily for 5 days. (Patient not taking: Reported on 03/11/2020) 5 tablet 0   No facility-administered medications prior to visit.    Allergies  Allergen Reactions  . Losartan Swelling    Angioedema  . Nsaids Swelling    IBU - tongue swlling.   . Levofloxacin Swelling  . Oxycodone Hcl Swelling    Lip started to swell up  . Verapamil Swelling  . Biaxin [Clarithromycin] Other (See Comments)    Elevated heart rate  . Oxycontin [Oxycodone]     Lips swell  . Vicodin [Hydrocodone-Acetaminophen]      swelling    ROS Review of Systems    Objective:    Physical Exam Constitutional:      Appearance: She is well-developed.  HENT:     Head: Normocephalic and atraumatic.  Cardiovascular:     Rate and Rhythm: Normal rate and regular rhythm.     Heart sounds: Normal heart sounds.  Pulmonary:  Effort: Pulmonary effort is normal.     Breath sounds: Normal breath sounds.  Skin:    General: Skin is warm and dry.  Neurological:     Mental Status: She is alert and oriented to person, place, and time.  Psychiatric:        Behavior: Behavior normal.     BP (!) 125/54   Pulse (!) 57   Ht _0  (1.651 m)   Wt 167 lb (75.8 kg)   SpO2 100%   BMI 27.79 kg/m  Wt Readings from Last 3 Encounters:  03/23/20 167 lb (75.8 kg)  12/22/19 168 lb (76.2 kg)  12/15/19 170 lb (77.1 kg)     Health Maintenance Due  Topic Date Due  . OPHTHALMOLOGY EXAM  Never done    There are no preventive care reminders to display for this patient.  No results found for: TSH Lab Results  Component Value Date   WBC 13.5 (H) 12/05/2013   HGB 9.3 (L) 12/05/2013   HCT 27.1 (L) 12/05/2013   MCV 85.5 12/05/2013   PLT 247 12/05/2013   Lab Results  Component Value Date   NA 136 12/29/2019   K 4.0 12/29/2019   CO2 28 12/29/2019   GLUCOSE 134 12/29/2019   BUN 24 12/29/2019   CREATININE 0.91 (H) 12/29/2019   BILITOT 0.5 06/19/2019   ALKPHOS 83 03/27/2019   AST 11 06/19/2019   ALT 10 06/19/2019   PROT 7.4 06/19/2019   ALBUMIN 3.7 09/25/2013   CALCIUM 9.7 12/29/2019   Lab Results  Component Value Date   CHOL 120 06/19/2019   Lab Results  Component Value Date   HDL 28 (L) 06/19/2019   Lab Results  Component Value Date   LDLCALC 63 06/19/2019   Lab Results  Component Value Date   TRIG 220 (H) 06/19/2019   Lab Results  Component Value Date   CHOLHDL 4.3 06/19/2019   Lab Results  Component Value Date   HGBA1C 7.4 (A) 03/23/2020      Assessment & Plan:   Problem List Items  Addressed This Visit      Cardiovascular and Mediastinum   Essential hypertension   Relevant Medications   simvastatin (ZOCOR) 80 MG tablet     Respiratory   Asthma in adult    No recent flares or exacerbations.          Endocrine   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[place]) - Primary    Well controlled based on age. Continue current regimen. Follow up in  3 months.       Relevant Medications   simvastatin (ZOCOR) 80 MG tablet   Other Relevant Orders   POCT glycosylated hemoglobin (Hb A1C) (Completed)     Genitourinary   Chronic kidney disease (CKD), stage III (moderate)    In April renal function looked much better just encouraged her to stay hydrated especially this summer if she is out in the heat.  Following renal function every 6 months.        Other   Hyperlipidemia    Rating statin well without any side effects or problems.  Will be due for repeat lipid panel in October.      Relevant Medications   simvastatin (ZOCOR) 80 MG tablet      Meds ordered this encounter  Medications  . simvastatin (ZOCOR) 80 MG tablet    Sig: Take 1 tablet (80 mg total) by mouth at bedtime.    Dispense:  90 tablet  Refill:  3    Follow-up: Return in about 3 months (around 06/23/2020) for Diabetes follow-up and labs. Beatrice Lecher, MD

## 2020-03-23 NOTE — Assessment & Plan Note (Signed)
In April renal function looked much better just encouraged her to stay hydrated especially this summer if she is out in the heat.  Following renal function every 6 months.

## 2020-03-23 NOTE — Assessment & Plan Note (Signed)
Rating statin well without any side effects or problems.  Will be due for repeat lipid panel in October.

## 2020-03-23 NOTE — Assessment & Plan Note (Signed)
No recent flares or exacerbations.  

## 2020-03-23 NOTE — Assessment & Plan Note (Signed)
Well controlled based on age. Continue current regimen. Follow up in  3 months.

## 2020-03-24 ENCOUNTER — Encounter: Payer: Medicare PPO | Admitting: Physical Therapy

## 2020-03-26 ENCOUNTER — Other Ambulatory Visit: Payer: Self-pay

## 2020-03-26 ENCOUNTER — Ambulatory Visit (INDEPENDENT_AMBULATORY_CARE_PROVIDER_SITE_OTHER): Payer: Medicare PPO | Admitting: Rehabilitative and Restorative Service Providers"

## 2020-03-26 ENCOUNTER — Encounter: Payer: Self-pay | Admitting: Rehabilitative and Restorative Service Providers"

## 2020-03-26 DIAGNOSIS — M542 Cervicalgia: Secondary | ICD-10-CM

## 2020-03-26 DIAGNOSIS — R293 Abnormal posture: Secondary | ICD-10-CM | POA: Diagnosis not present

## 2020-03-26 NOTE — Therapy (Signed)
[facility] [place] [address] [place], [place], [address] Phone: [phone]   Fax:  [phone]  Physical Therapy Treatment  Patient Details  Name: [name] MRN: [id number] Date of Birth: [birth year] Referring Provider (PT): Dr.Thekkekandam   Encounter Date: 03/26/2020   PT End of Session - 03/26/20 1015    Visit Number 4    Date for PT Re-Evaluation 04/22/20    Authorization Type [employer] medicare    Authorization Time Period 03/11/20-04/22/20    Authorization - Visit Number 4    Authorization - Number of Visits 12    Progress Note Due on Visit 10    PT Start Time 1015    PT Stop Time 1106   moist heat end of session   PT Time Calculation (min) 51 min           Past Medical History:  Diagnosis Date  . Arthritis    "back; left hip; knees" (12/04/2013)  . Asthma   . Chronic bronchitis ([facility])    "usually get it twice/yr" (12/04/2013)  . Diabetes mellitus without complication ([facility])   . Environmental and seasonal allergies   . GERD (gastroesophageal reflux disease)   . High cholesterol   . Hypertension    pcp   [employer]     . Pneumonia    as a child    Past Surgical History:  Procedure Laterality Date  . ABDOMINAL HYSTERECTOMY    . BLADDER SUSPENSION    . COLONOSCOPY    . JOINT REPLACEMENT    . SHOULDER ARTHROSCOPY W/ ROTATOR CUFF REPAIR Right   . 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   Procedure: TOTAL HIP ARTHROPLASTY;  Surgeon: Nadara Mustard, MD;  Location: [facility];  Service: Orthopedics;  Laterality: Left;  Left Total Hip Arthroplasty  . 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   Procedure: TOTAL KNEE ARTHROPLASTY;  Surgeon: Nadara Mustard, MD;  Location: [facility];  Service: Orthopedics;  Laterality: Right;  Right Total Knee Arthroplasty    There were no vitals filed for this visit.   Subjective Assessment - 03/26/20 1016    Subjective Shoulder has been hurting  yesterday and today. Was doing better. maybe flared up from working in the garden.    Currently in Pain? Yes    Pain Score 3     Pain Location Shoulder    Pain Orientation Right;Upper    Pain Descriptors / Indicators Aching;Sore    Pain Type Acute pain    Pain Radiating Towards shoulder balde and upper trap    Pain Onset More than a month ago    Pain Frequency Intermittent    Aggravating Factors  working in the garden    Pain Relieving Factors heat; ice; tylenol                             [facility] Adult PT Treatment/Exercise - 03/26/20 0001      Self-Care   Self-Care --   discussed modification-recliner position/avoiding recliner     Neck Exercises: Standing   Other Standing Exercises scap squeeze with small noodle along spine 5 sec x 5 reps       Neck Exercises: Seated   Neck Retraction 5 reps;5 secs    Shoulder Rolls Backwards;10 reps    Other Seated Exercise scap squeeze x 10 sec x 5 reps x 2 sets with noodle along spine  Neck Exercises: Supine   Cervical Rotation Right;Left;5 reps      Shoulder Exercises: Supine   Other Supine Exercises chest lift 5 reps x 10 sec x 2 sets       Shoulder Exercises: Standing   Other Standing Exercises scap squeeze with small noodle 10 sec x 5 reps x 2 sets       Moist Heat Therapy   Number Minutes Moist Heat 10 Minutes    Moist Heat Location Cervical   upper back; pecs Lt      Manual Therapy   Manual Therapy Soft tissue mobilization    Manual therapy comments pt sitting and supine to decrease fascial restrictions and improve mobility.     Soft tissue mobilization soft tissue work through pecs; anterior shoulder; periscapular musculature; upper trap/leveator    Myofascial Release anterior chest bilat     Scapular Mobilization Lt in all planes     Passive ROM Lt shoulder flexion with counter pressure through the lateral scapular border     Manual Traction through Lt UE long arm traction 20 sec pull x 2 reps UE in ~  30 deg abduction at side                        PT Long Term Goals - 03/18/20 1524      PT LONG TERM GOAL #1   Title Ind with HEP for maintaining good posture.    Time 6    Period Weeks    Status On-going      PT LONG TERM GOAL #2   Title Pt able to perform ADLS without neck/shoulder pain 90% of the time.    Baseline 50% improvement reported.    Time 6    Period Weeks    Status On-going      PT LONG TERM GOAL #3   Title Patient to demo 5/5 cervical strength to prevent further injury.    Time 6    Period Weeks    Status On-going      PT LONG TERM GOAL #4   Title Improved FOTO limitations to <= 33%    Baseline 47% baseline    Time 6    Period Weeks    Status On-going                 Plan - 03/26/20 1023    Clinical Impression Statement Increase in pain yesterday and today. Patient has been working in the garden and feels this may have irritated things in the shoulder. Continued work on posture and alignment as well as stretching and stabilization. Discussed modification of recliner for home vs avoiding recliner which creates poor position for head neck and shoulders    Rehab Potential Good    PT Frequency 2x / week    PT Duration 6 weeks    PT Treatment/Interventions ADLs/Self Care Home Management;Cryotherapy;Electrical Stimulation;Moist Heat;Therapeutic exercise;Therapeutic activities;Neuromuscular re-education;Manual techniques;Patient/family education;Dry needling;Taping;Spinal Manipulations;Joint Manipulations    PT Next Visit Plan manual therapy to neck/UQ.  continue postural strengthening/ flexibility.    PT Home Exercise Plan [id number]           Patient will benefit from skilled therapeutic intervention in order to improve the following deficits and impairments:     Visit Diagnosis: Cervicalgia  Abnormal posture     Problem List Patient Active Problem List   Diagnosis Date Noted  . DDD (degenerative disc disease), cervical 03/03/2020   . BMI 28.0-28.9,adult 05/01/2019  .  Chronic kidney disease (CKD), stage III (moderate) 03/30/2019  . Primary open angle glaucoma (POAG) of left eye, mild stage 11/05/2018  . 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 Presence of orthopedic joint implant 12/03/2013  . 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 DDD (degenerative disc disease), lumbar 01/05/2012  . Osteopenia 07/18/2010  . Asthma in adult 07/18/2010  . Backache 11/01/2009  . Anemia, unspecified 09/14/2009  . VITAMIN D DEFICIENCY 09/02/2009  . Hyperlipidemia 09/02/2009  . Essential hypertension 09/02/2009  . ALLERGIC RHINITIS 09/02/2009  . REFLUX ESOPHAGITIS 09/02/2009  . URINARY INCONTINENCE, 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    Arlyss Weathersby Rober Minion PT, MPH  03/26/2020, 11:02 AM  [facility] [address] [place], [place], [address] Phone: [phone]   Fax:  [phone]  Name: [name] MRN: [id number] Date of Birth: 12-03-1940

## 2020-03-30 ENCOUNTER — Ambulatory Visit (INDEPENDENT_AMBULATORY_CARE_PROVIDER_SITE_OTHER): Payer: Medicare PPO | Admitting: Rehabilitative and Restorative Service Providers"

## 2020-03-30 ENCOUNTER — Encounter: Payer: Self-pay | Admitting: Rehabilitative and Restorative Service Providers"

## 2020-03-30 ENCOUNTER — Other Ambulatory Visit: Payer: Self-pay

## 2020-03-30 DIAGNOSIS — M542 Cervicalgia: Secondary | ICD-10-CM

## 2020-03-30 DIAGNOSIS — R293 Abnormal posture: Secondary | ICD-10-CM | POA: Diagnosis not present

## 2020-03-30 NOTE — Therapy (Signed)
[facility] [address] [place], [place], [address] Phone: [phone]   Fax:  [phone]  Physical Therapy Treatment  Patient Details  Name: [name] MRN: [id number] Date of Birth: 12/05/1940 Referring Provider (PT): Dr.Thekkekandam   Encounter Date: 03/30/2020   PT End of Session - 03/30/20 0803    Visit Number 5    Date for PT Re-Evaluation 04/22/20    Authorization - Visit Number 5    Authorization - Number of Visits 12    PT Start Time 0802    PT Stop Time 0857    PT Time Calculation (min) 55 min    Activity Tolerance Patient tolerated treatment well           Past Medical History:  Diagnosis Date  . Arthritis    "back; left hip; knees" (12/04/2013)  . Asthma   . Chronic bronchitis (HCC)    "usually get it twice/yr" (12/04/2013)  . Diabetes mellitus without complication (HCC)   . Environmental and seasonal allergies   . GERD (gastroesophageal reflux disease)   . High cholesterol   . Hypertension    pcp   [employer]     . Pneumonia    as a child    Past Surgical History:  Procedure Laterality Date  . ABDOMINAL HYSTERECTOMY    . BLADDER SUSPENSION    . COLONOSCOPY    . JOINT REPLACEMENT    . SHOULDER ARTHROSCOPY W/ ROTATOR CUFF REPAIR Right   . 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   Procedure: TOTAL HIP ARTHROPLASTY;  Surgeon: Nadara Mustard, MD;  Location: [facility] OR;  Service: Orthopedics;  Laterality: Left;  Left Total Hip Arthroplasty  . 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   Procedure: TOTAL KNEE ARTHROPLASTY;  Surgeon: Nadara Mustard, MD;  Location: [facility] OR;  Service: Orthopedics;  Laterality: Right;  Right Total Knee Arthroplasty    There were no vitals filed for this visit.   Subjective Assessment - 03/30/20 0806    Subjective Patient reports that she had a bad day Saturday - not sure of anything she did to cause increased soreness.  Rested a  lot yesterday and it seems to be some better today. Still hurts around "that shoulder blade'    Currently in Pain? Yes    Pain Score 2     Pain Location Shoulder    Pain Orientation Left;Posterior    Pain Descriptors / Indicators Aching;Sore    Pain Type Acute pain    Pain Radiating Towards shoulder balde and upper trap    Pain Onset More than a month ago    Pain Frequency Intermittent    Aggravating Factors  not sure              [facility] PT Assessment - 03/30/20 0001      Assessment   Medical Diagnosis cervical DDD    Referring Provider (PT) Dr.Thekkekandam    Onset Date/Surgical Date 02/26/20    Hand Dominance Right    Next MD Visit 03/31/20    Prior Therapy none for shoulder/neck       Posture/Postural Control   Posture Comments incresaed thoracic kyphosis; elevated Rt shoulder and scapula; right rotation of thoracic spine; stands right trunk lean      AROM   AROM Assessment Site --   no pain just "tight"   Cervical Flexion WNL's     Cervical Extension WFL's     Cervical -  Right Side Bend 20    Cervical - Left Side Bend 18    Cervical - Right Rotation 68    Cervical - Left Rotation 76      Palpation   Palpation comment muscular tightness through te Lt supraspinitus; infraspinitus; periscapular musculature; bilat upper trap; leveator; pecs                         [facility] Adult PT Treatment/Exercise - 03/30/20 0001      Shoulder Exercises: Seated   Retraction Strengthening;Both;15 reps;Theraband    Theraband Level (Shoulder Retraction) Level 1 (Yellow)    Row Strengthening;Both;15 reps;Theraband    Theraband Level (Shoulder Row) Level 3 (Green)   band secured with feet    Other Seated Exercises scap squeeze with noodle x 10     Other Seated Exercises shoulder rolls posterior x 15       Shoulder Exercises: Pulleys   Flexion --   10 reps x 10 sec hold      Shoulder Exercises: ROM/Strengthening   UBE (Upper Arm Bike) L1 x 4 min alternating fwd/back         Moist Heat Therapy   Number Minutes Moist Heat 15 Minutes    Moist Heat Location Cervical   thoracic spine      Electrical Stimulation   Electrical Stimulation Location Lt periscapular area    Electrical Stimulation Action TENS    Electrical Stimulation Parameters to tolerance    Electrical Stimulation Goals Pain;Tone      Manual Therapy   Manual therapy comments skilled palpation for assessment of response to dry needling and manual work     Soft tissue mobilization working through the posterior shoulder girdle/thoracic spine periscapular area     Myofascial Release Lt thoracic spine     Scapular Mobilization Lt             Trigger Point Dry Needling - 03/30/20 0001    Consent Given? Yes    Education Handout Provided Yes    Supraspinatus Response Palpable increased muscle length    Infraspinatus Response Palpable increased muscle length                PT Education - 03/30/20 0824    Education Details HEP issued yellow and green bands; DN    Person(s) Educated Patient    Methods Explanation;Demonstration;Tactile cues;Verbal cues;Handout    Comprehension Verbalized understanding;Returned demonstration;Verbal cues required;Tactile cues required               PT Long Term Goals - 03/18/20 1524      PT LONG TERM GOAL #1   Title Ind with HEP for maintaining good posture.    Time 6    Period Weeks    Status On-going      PT LONG TERM GOAL #2   Title Pt able to perform ADLS without neck/shoulder pain 90% of the time.    Baseline 50% improvement reported.    Time 6    Period Weeks    Status On-going      PT LONG TERM GOAL #3   Title Patient to demo 5/5 cervical strength to prevent further injury.    Time 6    Period Weeks    Status On-going      PT LONG TERM GOAL #4   Title Improved FOTO limitations to <= 33%    Baseline 47% baseline    Time 6    Period Weeks  Status On-going                 Plan - 03/30/20 6384    Clinical  Impression Statement Patient continues to have paiin in the Lt shoulder blade area. She is unsure of what irritates the symptoms. She has modified the recliner to add a pillow along the back to decrease the rounded posture. Added strengthening exercises today. Trial of DN to Lt scapular area with good response - decreased tightness/pain and improved tissue extensibility. Continue with manual work and modalities.    Rehab Potential Good    PT Frequency 2x / week    PT Duration 6 weeks    PT Treatment/Interventions ADLs/Self Care Home Management;Cryotherapy;Electrical Stimulation;Moist Heat;Therapeutic exercise;Therapeutic activities;Neuromuscular re-education;Manual techniques;Patient/family education;Dry needling;Taping;Spinal Manipulations;Joint Manipulations    PT Next Visit Plan assess response to DN; manual therapy to neck/UQ.  continue postural strengthening/ flexibility - note to MD    PT Home Exercise Plan [email or web address]    Consulted and Agree with Plan of Care Patient           Patient will benefit from skilled therapeutic intervention in order to improve the following deficits and impairments:     Visit Diagnosis: Cervicalgia  Abnormal posture     Problem List Patient Active Problem List   Diagnosis Date Noted  . DDD (degenerative disc disease), cervical 03/03/2020  . BMI 28.0-28.9,adult 05/01/2019  . Chronic kidney disease (CKD), stage III (moderate) 03/30/2019  . Primary open angle glaucoma (POAG) of left eye, mild stage 11/05/2018  . 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 Presence of orthopedic joint implant 12/03/2013  . 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 DDD (degenerative disc disease), lumbar 01/05/2012  . Osteopenia 07/18/2010  . Asthma in adult 07/18/2010  . Backache 11/01/2009  . Anemia, unspecified 09/14/2009  . VITAMIN D DEFICIENCY 09/02/2009  .  Hyperlipidemia 09/02/2009  . Essential hypertension 09/02/2009  . ALLERGIC RHINITIS 09/02/2009  . REFLUX ESOPHAGITIS 09/02/2009  . URINARY INCONTINENCE, 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    Tyrica Afzal Rober Minion PT, MPH  03/30/2020, 9:09 AM  [facility] [address] [place], [place], [address] Phone: [phone]   Fax:  [phone]  Name: [name] MRN: [id number] Date of Birth: 1941-03-19

## 2020-03-30 NOTE — Patient Instructions (Signed)
Trigger Point Dry Needling  . What is Trigger Point Dry Needling (DN)? o DN is a physical therapy technique used to treat muscle pain and dysfunction. Specifically, DN helps deactivate muscle trigger points (muscle knots).  o A thin filiform needle is used to penetrate the skin and stimulate the underlying trigger point. The goal is for a local twitch response (LTR) to occur and for the trigger point to relax. No medication of any kind is injected during the procedure.   . What Does Trigger Point Dry Needling Feel Like?  o The procedure feels different for each individual patient. Some patients report that they do not actually feel the needle enter the skin and overall the process is not painful. Very mild bleeding may occur. However, many patients feel a deep cramping in the muscle in which the needle was inserted. This is the local twitch response.   Marland Kitchen How Will I feel after the treatment? o Soreness is normal, and the onset of soreness may not occur for a few hours. Typically this soreness does not last longer than two days.  o Bruising is uncommon, however; ice can be used to decrease any possible bruising.  o In rare cases feeling tired or nauseous after the treatment is normal. In addition, your symptoms may get worse before they get better, this period will typically not last longer than 24 hours.   . What Can I do After My Treatment? o Increase your hydration by drinking more water for the next 24 hours. o You may place ice or heat on the areas treated that have become sore, however, do not use heat on inflamed or bruised areas. Heat often brings more relief post needling. o You can continue your regular activities, but vigorous activity is not recommended initially after the treatment for 24 hours. o DN is best combined with other physical therapy such as strengthening, stretching, and other therapies.    Access Code: [email or web address]: [email or web address]:  07/13/2021Prepared by: Emarion Toral HoltExercises  Doorway Pec Stretch at 90 Degrees Abduction - 2 x daily - 7 x weekly - 1 sets - 3 reps - 30-60 seconds hold  Seated Scapular Retraction - 1 x daily - 7 x weekly - 1-3 sets - 10 reps - 2-3 sec hold  Standing Isometric Cervical Sidebending with Manual Resistance - 1 x daily - 7 x weekly - 1 sets - 5 reps - 10 sec hold hold  Standing Shoulder External Rotation with Resistance - 2 x daily - 7 x weekly - 1-2 sets - 10 reps - 2-3 sec hold  Seated Shoulder Row with Resistance Anchored at Feet - 2 x daily - 7 x weekly - 1-2 sets - 10 reps - 2-3 sec hold

## 2020-03-31 ENCOUNTER — Encounter: Payer: Self-pay | Admitting: Sports Medicine

## 2020-03-31 ENCOUNTER — Ambulatory Visit (INDEPENDENT_AMBULATORY_CARE_PROVIDER_SITE_OTHER): Payer: Medicare PPO | Admitting: Sports Medicine

## 2020-03-31 DIAGNOSIS — M5030 Other cervical disc degeneration, unspecified cervical region: Secondary | ICD-10-CM | POA: Diagnosis not present

## 2020-03-31 NOTE — Assessment & Plan Note (Signed)
This is a very pleasant 79 year old female, she has been doing some extra yard work, she started to have pain in her neck, left periscapular region with occasional radiation down to her thumb. It was worse with prolonged downgaze and turning the head to the left, x-rays from an outside facility back in 2017 showed C5-C6 DDD, after 5 days of prednisone, physical therapy including dry needling she has had a fantastic response, she is almost pain-free now and has a few weeks left of physical therapy, she can return to see me on an as-needed basis, neck step would be MRI for injections if no better.

## 2020-03-31 NOTE — Progress Notes (Signed)
    Procedures performed today:    None.  Independent interpretation of notes and tests performed by another provider:   None.  Brief History, Exam, Impression, and Recommendations:    DDD (degenerative disc disease), cervical This is a very pleasant 79 year old female, she has been doing some extra yard work, she started to have pain in her neck, left periscapular region with occasional radiation down to her thumb. It was worse with prolonged downgaze and turning the head to the left, x-rays from an outside facility back in 2017 showed C5-C6 DDD, after 5 days of prednisone, physical therapy including dry needling she has had a fantastic response, she is almost pain-free now and has a few weeks left of physical therapy, she can return to see me on an as-needed basis, neck step would be MRI for injections if no better.    ___________________________________________ Ihor Austin. Benjamin Stain, M.D., ABFM., CAQSM. Primary Care and Sports Medicine [facility]  Adjunct Instructor of Family Medicine  [employer]

## 2020-04-01 ENCOUNTER — Other Ambulatory Visit: Payer: Self-pay

## 2020-04-01 ENCOUNTER — Ambulatory Visit (INDEPENDENT_AMBULATORY_CARE_PROVIDER_SITE_OTHER): Payer: Medicare PPO | Admitting: Physical Therapy

## 2020-04-01 DIAGNOSIS — M542 Cervicalgia: Secondary | ICD-10-CM

## 2020-04-01 DIAGNOSIS — R293 Abnormal posture: Secondary | ICD-10-CM | POA: Diagnosis not present

## 2020-04-01 NOTE — Therapy (Signed)
[facility] [address] [place], [place], [address] Phone: [phone]   Fax:  [phone]  Physical Therapy Treatment  Patient Details  Name: [name] MRN: [id number] Date of Birth: [birth year] Referring Provider (PT): Dr.Thekkekandam   Encounter Date: 04/01/2020   PT End of Session - 04/01/20 0810    Visit Number 6    Date for PT Re-Evaluation 04/22/20    Authorization Type [employer] medicare    Authorization Time Period 03/11/20-04/22/20    Authorization - Visit Number 6    Authorization - Number of Visits 12    PT Start Time 0804    PT Stop Time 0849    PT Time Calculation (min) 45 min    Activity Tolerance Patient tolerated treatment well    Behavior During Therapy [facility] for tasks assessed/performed           Past Medical History:  Diagnosis Date  . Arthritis    "back; left hip; knees" (12/04/2013)  . Asthma   . Chronic bronchitis (HCC)    "usually get it twice/yr" (12/04/2013)  . Diabetes mellitus without complication (HCC)   . Environmental and seasonal allergies   . GERD (gastroesophageal reflux disease)   . High cholesterol   . Hypertension    pcp   [employer]     . Pneumonia    as a child    Past Surgical History:  Procedure Laterality Date  . ABDOMINAL HYSTERECTOMY    . BLADDER SUSPENSION    . COLONOSCOPY    . JOINT REPLACEMENT    . SHOULDER ARTHROSCOPY W/ ROTATOR CUFF REPAIR Right   . 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   Procedure: TOTAL HIP ARTHROPLASTY;  Surgeon: Nadara Mustard, MD;  Location: [facility] OR;  Service: Orthopedics;  Laterality: Left;  Left Total Hip Arthroplasty  . 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   Procedure: TOTAL KNEE ARTHROPLASTY;  Surgeon: Nadara Mustard, MD;  Location: [facility] OR;  Service: Orthopedics;  Laterality: Right;  Right Total Knee Arthroplasty    There were no vitals filed for this visit.   Subjective Assessment -  04/01/20 0807    Subjective Pt reports some soreness in Lt shoulder after DN last session. When asked how much improvement she has had, "It's just about there".    Pertinent History DM, OA, HTN, low back pain, Rt RCR, Rt TKA, Lt THA    Currently in Pain? Yes    Pain Score 1     Pain Location Shoulder    Pain Orientation Left;Posterior    Pain Descriptors / Indicators Sore    Aggravating Factors  not sure    Pain Relieving Factors heat              [facility] PT Assessment - 04/01/20 0001      Assessment   Medical Diagnosis cervical DDD    Referring Provider (PT) Dr.Thekkekandam    Onset Date/Surgical Date 02/26/20    Hand Dominance Right    Next MD Visit PRN    Prior Therapy none for shoulder/neck             [facility] Adult PT Treatment/Exercise - 04/01/20 0001      Shoulder Exercises: Supine   Protraction AROM;Left;10 reps    Horizontal ABduction Strengthening;Both;10 reps    Theraband Level (Shoulder Horizontal ABduction) Level 2 (Red)    Diagonals Strengthening;Left;15 reps;Theraband    Theraband Level (Shoulder Diagonals) Level 2 (Red)  Shoulder Exercises: Seated   Retraction Strengthening;Both;15 reps;Theraband    Theraband Level (Shoulder Retraction) Level 1 (Yellow)    Row Strengthening;Both;15 reps;Theraband    Theraband Level (Shoulder Row) Level 3 (Green)   band secured with feet    Other Seated Exercises shoulder rolls posterior x 5 CW, 5 CCW       Shoulder Exercises: Pulleys   Flexion 2 minutes    ABduction 2 minutes      Shoulder Exercises: ROM/Strengthening   UBE (Upper Arm Bike) L1 x 3 min alternating fwd/back       Moist Heat Therapy   Number Minutes Moist Heat 10 Minutes    Moist Heat Location Cervical   thoracic spine      Electrical Stimulation   Electrical Stimulation Location Lt periscapular area    Electrical Stimulation Action IFC    Electrical Stimulation Parameters 10 min, intensity to tolerance    Electrical Stimulation Goals Pain        Manual Therapy   Soft tissue mobilization STM to Lt periscapular, upper trap, levator and upper thoracic paraspinals.     Scapular Mobilization Lt                       PT Long Term Goals - 04/01/20 0847      PT LONG TERM GOAL #1   Title Ind with HEP for maintaining good posture.    Time 6    Period Weeks    Status On-going      PT LONG TERM GOAL #2   Title Pt able to perform ADLS without neck/shoulder pain 90% of the time.    Time 6    Period Weeks    Status Achieved      PT LONG TERM GOAL #3   Title Patient to demo 5/5 cervical strength to prevent further injury.    Time 6    Period Weeks    Status On-going      PT LONG TERM GOAL #4   Title Improved FOTO limitations to <= 33%    Baseline 47% baseline    Time 6    Period Weeks    Status On-going                 Plan - 04/01/20 0845    Clinical Impression Statement Positive response with DN last session.  Lt neck and periscapular musculature responded well to STM this session.  Pt able to tolerate postural exercises with minimal cues.  Progressing well towards goals.    Rehab Potential Good    PT Frequency 2x / week    PT Duration 6 weeks    PT Treatment/Interventions ADLs/Self Care Home Management;Cryotherapy;Electrical Stimulation;Moist Heat;Therapeutic exercise;Therapeutic activities;Neuromuscular re-education;Manual techniques;Patient/family education;Dry needling;Taping;Spinal Manipulations;Joint Manipulations    PT Next Visit Plan manual therapy to neck/UQ.  continue postural strengthening/ flexibility    PT Home Exercise Plan [email or web address]    Consulted and Agree with Plan of Care Patient           Patient will benefit from skilled therapeutic intervention in order to improve the following deficits and impairments:  Pain, Impaired flexibility, Postural dysfunction, Decreased strength  Visit Diagnosis: Cervicalgia  Abnormal posture     Problem List Patient Active Problem List    Diagnosis Date Noted  . DDD (degenerative disc disease), cervical 03/03/2020  . BMI 28.0-28.9,adult 05/01/2019  . Chronic kidney disease (CKD), stage III (moderate) 03/30/2019  . Primary open angle glaucoma (POAG) of left eye,  mild stage 11/05/2018  . 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 Presence of orthopedic joint implant 12/03/2013  . 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 DDD (degenerative disc disease), lumbar 01/05/2012  . Osteopenia 07/18/2010  . Asthma in adult 07/18/2010  . Backache 11/01/2009  . Anemia, unspecified 09/14/2009  . VITAMIN D DEFICIENCY 09/02/2009  . Hyperlipidemia 09/02/2009  . Essential hypertension 09/02/2009  . ALLERGIC RHINITIS 09/02/2009  . REFLUX ESOPHAGITIS 09/02/2009  . URINARY INCONTINENCE, 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   Mayer Camel, PTA 04/01/20 8:48 AM  [facility] [address] [place], [place], [address] Phone: [phone]   Fax:  [phone]  Name: [name] MRN: [id number] Date of Birth: 06-Jun-1941

## 2020-04-02 ENCOUNTER — Ambulatory Visit (INDEPENDENT_AMBULATORY_CARE_PROVIDER_SITE_OTHER): Payer: Medicare PPO | Admitting: Rehabilitative and Restorative Service Providers"

## 2020-04-02 ENCOUNTER — Encounter: Payer: Self-pay | Admitting: Rehabilitative and Restorative Service Providers"

## 2020-04-02 DIAGNOSIS — R293 Abnormal posture: Secondary | ICD-10-CM

## 2020-04-02 DIAGNOSIS — M542 Cervicalgia: Secondary | ICD-10-CM | POA: Diagnosis not present

## 2020-04-02 NOTE — Patient Instructions (Signed)

## 2020-04-02 NOTE — Therapy (Signed)
[facility] [place] [address] [place] [address] [place], [place], [address] Phone: [phone]   Fax:  [phone]  Physical Therapy Treatment  Patient Details  Name: [name] MRN: [id number] Date of Birth: 11-Sep-1941 Referring Provider (PT): Dr.Thekkekandam   Encounter Date: 04/02/2020   PT End of Session - 04/02/20 1541    Visit Number 7    Number of Visits 20    Date for PT Re-Evaluation 05/06/20    Authorization Type [employer] medicare    Authorization Time Period 03/11/20-05/06/20    Authorization - Visit Number 7    Authorization - Number of Visits 20    Progress Note Due on Visit 10    PT Start Time 1532    PT Stop Time 1620    PT Time Calculation (min) 48 min    Activity Tolerance Patient tolerated treatment well           Past Medical History:  Diagnosis Date  . Arthritis    "back; left hip; knees" (12/04/2013)  . Asthma   . Chronic bronchitis (HCC)    "usually get it twice/yr" (12/04/2013)  . Diabetes mellitus without complication (HCC)   . Environmental and seasonal allergies   . GERD (gastroesophageal reflux disease)   . High cholesterol   . Hypertension    pcp   [employer]     . Pneumonia    as a child    Past Surgical History:  Procedure Laterality Date  . ABDOMINAL HYSTERECTOMY    . BLADDER SUSPENSION    . COLONOSCOPY    . JOINT REPLACEMENT    . SHOULDER ARTHROSCOPY W/ ROTATOR CUFF REPAIR Right   . 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   Procedure: TOTAL HIP ARTHROPLASTY;  Surgeon: Nadara Mustard, MD;  Location: [facility] OR;  Service: Orthopedics;  Laterality: Left;  Left Total Hip Arthroplasty  . 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   Procedure: TOTAL KNEE ARTHROPLASTY;  Surgeon: Nadara Mustard, MD;  Location: [facility] OR;  Service: Orthopedics;  Laterality: Right;  Right Total Knee Arthroplasty    There were no vitals filed for this visit.   Subjective Assessment -  04/02/20 1550    Subjective Patient reports that she was taking the trash can to the curbv last night when she felt incresaed pain in the Lt shoulder. Pain has persisted off and on all night. She has treated the shoulder with heat and has been taking tylenol. It has eased off some today.    Currently in Pain? Yes    Pain Score 2     Pain Location Shoulder    Pain Orientation Left;Posterior    Pain Descriptors / Indicators Sore    Pain Type Acute pain    Pain Radiating Towards shoulder blade and upper trap    Pain Onset More than a month ago    Pain Frequency Intermittent    Aggravating Factors  using Lt UE for activities              [facility] PT Assessment - 04/02/20 0001      Assessment   Medical Diagnosis cervical DDD    Referring Provider (PT) Dr.Thekkekandam    Onset Date/Surgical Date 02/26/20    Hand Dominance Right    Next MD Visit PRN    Prior Therapy none for shoulder/neck       AROM   Overall AROM Comments tightness with cervical motions    Cervical -  Right Side Bend 20    Cervical - Left Side Bend 19    Cervical - Right Rotation 60    Cervical - Left Rotation 60      Palpation   Palpation comment muscular tightness through te Lt supraspinitus; infraspinitus; periscapular musculature; bilat upper trap; leveator; pecs                         [facility] Adult PT Treatment/Exercise - 04/02/20 0001      Neck Exercises: Seated   W Back 10 reps   with noodle along spine    Shoulder Rolls Backwards;10 reps    Other Seated Exercise scap squeeze x 10 sec x 5 reps x 2 sets; L's x 15 with noodle along spine       Shoulder Exercises: Pulleys   Flexion 2 minutes    ABduction 2 minutes      Moist Heat Therapy   Number Minutes Moist Heat 10 Minutes    Moist Heat Location Shoulder   thoracic spine Lt      Electrical Stimulation   Electrical Stimulation Location Lt periscapular area x 3; Lt pec x 1    Electrical Stimulation Action IFC    Electrical Stimulation  Parameters to toerance     Electrical Stimulation Goals Pain      Manual Therapy   Soft tissue mobilization pt Rt sidelying manual work through [employer], upper trap, levator and upper thoracic paraspinals.     Scapular Mobilization Lt                   PT Education - 04/02/20 1631    Education Details TENS    Person(s) Educated Patient    Methods Explanation    Comprehension Verbalized understanding               PT Long Term Goals - 04/02/20 1624      PT LONG TERM GOAL #1   Title Independent with HEP for maintaining good posture.    Time 8    Status Revised    Target Date 05/06/20      PT LONG TERM GOAL #2   Title Pt able to perform ADLS without neck/shoulder pain 90% of the time.    Baseline 50% improvement reported.    Time 8    Period Weeks    Status Revised    Target Date 05/06/20      PT LONG TERM GOAL #3   Title Increase cervical mobility in lateral flexion and rotation by 5-7 degrees    Time 8    Period Weeks    Status Revised    Target Date 05/06/20      PT LONG TERM GOAL #4   Title Improved FOTO limitations to <= 33%    Baseline -    Time 8    Period Weeks    Status Revised    Target Date 05/06/20                 Plan - 04/02/20 1619    Clinical Impression Statement Patient responded well to treatment including exercise; manual work and DN with good improvement in pain and incresae in functional activity level. She pulled her trash can to the road last night and had significant increase in Lt shoulder pain reporting difficulty sleeping and decreased ability to work around her home. She does not demonstrate significant changes in objective finding. She has continued muscular tightness and  tenderness to palpation through the Lt shoulder girdle. Good response to intervention in clinic todya. Patient may benefit from increased treatment frequency to address problems identified.    Rehab Potential Good    PT Frequency 3x / week     PT Duration 8 weeks    PT Treatment/Interventions ADLs/Self Care Home Management;Cryotherapy;Electrical Stimulation;Moist Heat;Therapeutic exercise;Therapeutic activities;Neuromuscular re-education;Manual techniques;Patient/family education;Dry needling;Taping;Spinal Manipulations;Joint Manipulations    PT Next Visit Plan manual therapy vs DN to neck/UQ.  continue postural strengthening/ flexibility    PT Home Exercise Plan [email or web address]    Consulted and Agree with Plan of Care Patient           Patient will benefit from skilled therapeutic intervention in order to improve the following deficits and impairments:     Visit Diagnosis: Cervicalgia - Plan: PT plan of care cert/re-cert  Abnormal posture - Plan: PT plan of care cert/re-cert     Problem List Patient Active Problem List   Diagnosis Date Noted  . DDD (degenerative disc disease), cervical 03/03/2020  . BMI 28.0-28.9,adult 05/01/2019  . Chronic kidney disease (CKD), stage III (moderate) 03/30/2019  . Primary open angle glaucoma (POAG) of left eye, mild stage 11/05/2018  . 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 Presence of orthopedic joint implant 12/03/2013  . 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 DDD (degenerative disc disease), lumbar 01/05/2012  . Osteopenia 07/18/2010  . Asthma in adult 07/18/2010  . Backache 11/01/2009  . Anemia, unspecified 09/14/2009  . VITAMIN D DEFICIENCY 09/02/2009  . Hyperlipidemia 09/02/2009  . Essential hypertension 09/02/2009  . ALLERGIC RHINITIS 09/02/2009  . REFLUX ESOPHAGITIS 09/02/2009  . URINARY INCONTINENCE, 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    Kamala Kolton Rober Minion PT, MPH  04/02/2020, 4:35 PM  [facility] [address] [place] [address] [place], [place], [address] Phone: [phone]   Fax:  [phone]  Name: KIELYN KARDELL MRN: [id number] Date of Birth:  1941/07/24

## 2020-04-06 ENCOUNTER — Ambulatory Visit (INDEPENDENT_AMBULATORY_CARE_PROVIDER_SITE_OTHER): Payer: Medicare PPO | Admitting: Rehabilitative and Restorative Service Providers"

## 2020-04-06 ENCOUNTER — Encounter: Payer: Self-pay | Admitting: Rehabilitative and Restorative Service Providers"

## 2020-04-06 ENCOUNTER — Other Ambulatory Visit: Payer: Self-pay

## 2020-04-06 DIAGNOSIS — M542 Cervicalgia: Secondary | ICD-10-CM

## 2020-04-06 DIAGNOSIS — R293 Abnormal posture: Secondary | ICD-10-CM

## 2020-04-06 NOTE — Therapy (Signed)
[facility] [place] [address] [place] [address] [place], [place], [address] Phone: [phone]   Fax:  [phone]  Physical Therapy Treatment  Patient Details  Name: [name] MRN: [id number] Date of Birth: Feb 15, 1941 Referring Provider (PT): Dr.Thekkekandam   Encounter Date: 04/06/2020   PT End of Session - 04/06/20 0805    Visit Number 8    Number of Visits 20    Date for PT Re-Evaluation 05/06/20    Authorization Type [employer] medicare    Authorization Time Period 03/11/20-05/06/20    Authorization - Visit Number 8    Authorization - Number of Visits 20    Progress Note Due on Visit 10    PT Start Time 0802    PT Stop Time 0854    PT Time Calculation (min) 52 min    Activity Tolerance Patient tolerated treatment well           Past Medical History:  Diagnosis Date  . Arthritis    "back; left hip; knees" (12/04/2013)  . Asthma   . Chronic bronchitis (HCC)    "usually get it twice/yr" (12/04/2013)  . Diabetes mellitus without complication (HCC)   . Environmental and seasonal allergies   . GERD (gastroesophageal reflux disease)   . High cholesterol   . Hypertension    pcp   [employer]     . Pneumonia    as a child    Past Surgical History:  Procedure Laterality Date  . ABDOMINAL HYSTERECTOMY    . BLADDER SUSPENSION    . COLONOSCOPY    . JOINT REPLACEMENT    . SHOULDER ARTHROSCOPY W/ ROTATOR CUFF REPAIR Right   . 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   Procedure: TOTAL HIP ARTHROPLASTY;  Surgeon: Nadara Mustard, MD;  [facility]: [facility] OR;  Service: Orthopedics;  Laterality: Left;  Left Total Hip Arthroplasty  . 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   Procedure: TOTAL KNEE ARTHROPLASTY;  Surgeon: Nadara Mustard, MD;  [facility]: [facility] OR;  Service: Orthopedics;  Laterality: Right;  Right Total Knee Arthroplasty    There were no vitals filed for this visit.   Subjective Assessment -  04/06/20 0805    Subjective Patient reports that her shoulder is feeling better than it did last wekk but hurts the morning for "some unknown reason". She has done her exercises at home.    Currently in Pain? Yes    Pain Score 2     Pain Location Shoulder    Pain Orientation Left;Posterior    Pain Descriptors / Indicators Sore;Aching    Pain Type Acute pain                             [facility] Adult PT Treatment/Exercise - 04/06/20 0001      Shoulder Exercises: Seated   Extension Strengthening;Both;10 reps;Theraband    Theraband Level (Shoulder Extension) Level 3 (Green)    Retraction Strengthening;Both;15 reps;Theraband    Theraband Level (Shoulder Retraction) Level 1 (Yellow)    Row Strengthening;Both;15 reps;Theraband    Theraband Level (Shoulder Row) Level 3 (Green)      Shoulder Exercises: Pulleys   Flexion 2 minutes    ABduction 2 minutes      Moist Heat Therapy   Number Minutes Moist Heat 10 Minutes    Moist Heat Location Shoulder   thoracic spine Lt      Electrical Stimulation  Electrical Stimulation Location Lt periscapular area x 3; Lt pec x 1    Electrical Stimulation Action IFC    Electrical Stimulation Parameters to tolerance    Electrical Stimulation Goals Pain;Tone      Manual Therapy   Manual therapy comments skilled palpation for assessment of response to dry needling and manual work     Soft tissue mobilization pt Rt sidelying manual work through [employer], upper trap, levator and upper thoracic paraspinals.     Myofascial Release Lt thoracic spine             Trigger Point Dry Needling - 04/06/20 0001    Consent Given? Yes    Education Handout Provided Previously provided    Dry Needling Comments Lt posterior shoulder girdle     Supraspinatus Response Palpable increased muscle length    Infraspinatus Response Palpable increased muscle length    Teres major Response Palpable increased muscle length    Teres minor Response  Palpable increased muscle length                     PT Long Term Goals - 04/02/20 1624      PT LONG TERM GOAL #1   Title Independent with HEP for maintaining good posture.    Time 8    Status Revised    Target Date 05/06/20      PT LONG TERM GOAL #2   Title Pt able to perform ADLS without neck/shoulder pain 90% of the time.    Baseline 50% improvement reported.    Time 8    Period Weeks    Status Revised    Target Date 05/06/20      PT LONG TERM GOAL #3   Title Increase cervical mobility in lateral flexion and rotation by 5-7 degrees    Time 8    Period Weeks    Status Revised    Target Date 05/06/20      PT LONG TERM GOAL #4   Title Improved FOTO limitations to <= 33%    Baseline -    Time 8    Period Weeks    Status Revised    Target Date 05/06/20                 Plan - 04/06/20 0810    Clinical Impression Statement Patient returns with continued pain in the Lt posterior shoulder gidle area with variable intensity. She has responded well to DN and manual work. She continues to demonstrate forward posture with increased thoracic kyphosis.    Rehab Potential Good    PT Frequency 3x / week    PT Duration 6 weeks    PT Treatment/Interventions ADLs/Self Care Home Management;Cryotherapy;Electrical Stimulation;Moist Heat;Therapeutic exercise;Therapeutic activities;Neuromuscular re-education;Manual techniques;Patient/family education;Dry needling;Taping;Spinal Manipulations;Joint Manipulations    PT Next Visit Plan manual therapy vs DN to neck/UQ.  continue postural strengthening/ flexibility    PT Home Exercise Plan [email or web address]    Consulted and Agree with Plan of Care Patient           Patient will benefit from skilled therapeutic intervention in order to improve the following deficits and impairments:     Visit Diagnosis: Cervicalgia  Abnormal posture     Problem List Patient Active Problem List   Diagnosis Date Noted  . DDD (degenerative  disc disease), cervical 03/03/2020  . BMI 28.0-28.9,adult 05/01/2019  . Chronic kidney disease (CKD), stage III (moderate) 03/30/2019  . Primary open angle glaucoma (POAG) of left eye,  mild stage 11/05/2018  . 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 Presence of orthopedic joint implant 12/03/2013  . 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 DDD (degenerative disc disease), lumbar 01/05/2012  . Osteopenia 07/18/2010  . Asthma in adult 07/18/2010  . Backache 11/01/2009  . Anemia, unspecified 09/14/2009  . VITAMIN D DEFICIENCY 09/02/2009  . Hyperlipidemia 09/02/2009  . Essential hypertension 09/02/2009  . ALLERGIC RHINITIS 09/02/2009  . REFLUX ESOPHAGITIS 09/02/2009  . URINARY INCONTINENCE, 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    Kortny Lirette Rober Minion PT, MPH  04/06/2020, 8:38 AM  [facility] [address] [place] [address] [place], [place], [address] Phone: [phone]   Fax:  [phone]  Name: [name] MRN: [id number] Date of Birth: 07-24-1941

## 2020-04-08 ENCOUNTER — Ambulatory Visit (INDEPENDENT_AMBULATORY_CARE_PROVIDER_SITE_OTHER): Payer: Medicare PPO | Admitting: Rehabilitative and Restorative Service Providers"

## 2020-04-08 ENCOUNTER — Other Ambulatory Visit: Payer: Self-pay

## 2020-04-08 ENCOUNTER — Encounter: Payer: Self-pay | Admitting: Rehabilitative and Restorative Service Providers"

## 2020-04-08 DIAGNOSIS — R293 Abnormal posture: Secondary | ICD-10-CM

## 2020-04-08 DIAGNOSIS — M542 Cervicalgia: Secondary | ICD-10-CM | POA: Diagnosis not present

## 2020-04-08 NOTE — Patient Instructions (Addendum)
  Access Code: [email or web address]: [email or web address]: 07/22/2021Prepared by: Tyton Abdallah HoltExercises  Doorway Pec Stretch at 90 Degrees Abduction - 2 x daily - 7 x weekly - 1 sets - 3 reps - 30-60 seconds hold  Seated Scapular Retraction - 1 x daily - 7 x weekly - 1-3 sets - 10 reps - 2-3 sec hold  Standing Isometric Cervical Sidebending with Manual Resistance - 1 x daily - 7 x weekly - 1 sets - 5 reps - 10 sec hold hold  Standing Shoulder External Rotation with Resistance - 2 x daily - 7 x weekly - 1-2 sets - 10 reps - 2-3 sec hold  Seated Shoulder Row with Resistance Anchored at Feet - 2 x daily - 7 x weekly - 1-2 sets - 10 reps - 2-3 sec hold  Isometric Shoulder External Rotation at Wall - 2 x daily - 7 x weekly - 1-2 sets - 10 reps - 5 sec hold  Sidelying Shoulder External Rotation AROM - 2 x daily - 7 x weekly - 1-2 sets - 10 reps - 5 sec hold  Plus reverse wall push up

## 2020-04-08 NOTE — Therapy (Signed)
[facility] [place] [address] [place] [address] [place], [place], [address] Phone: [phone]   Fax:  [phone]  Physical Therapy Treatment  Patient Details  Name: [name] MRN: [id number] Date of Birth: 09/02/1941 Referring Provider (PT): Dr.Thekkekandam   Encounter Date: 04/08/2020   PT End of Session - 04/08/20 0847    Visit Number 9    Number of Visits 20    Date for PT Re-Evaluation 05/06/20    Authorization Type [employer] medicare    Authorization Time Period 03/11/20-05/06/20    Authorization - Visit Number 9    Authorization - Number of Visits 20    Progress Note Due on Visit 10    PT Start Time 0845    PT Stop Time 0935    PT Time Calculation (min) 50 min    Activity Tolerance Patient tolerated treatment well           Past Medical History:  Diagnosis Date  . Arthritis    "back; left hip; knees" (12/04/2013)  . Asthma   . Chronic bronchitis (HCC)    "usually get it twice/yr" (12/04/2013)  . Diabetes mellitus without complication (HCC)   . Environmental and seasonal allergies   . GERD (gastroesophageal reflux disease)   . High cholesterol   . Hypertension    pcp   [employer]     . Pneumonia    as a child    Past Surgical History:  Procedure Laterality Date  . ABDOMINAL HYSTERECTOMY    . BLADDER SUSPENSION    . COLONOSCOPY    . JOINT REPLACEMENT    . SHOULDER ARTHROSCOPY W/ ROTATOR CUFF REPAIR Right   . 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   Procedure: TOTAL HIP ARTHROPLASTY;  Surgeon: Nadara Mustard, MD;  [facility];  Service: Orthopedics;  Laterality: Left;  Left Total Hip Arthroplasty  . 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   Procedure: TOTAL KNEE ARTHROPLASTY;  Surgeon: Nadara Mustard, MD;  Location: [facility] OR;  Service: Orthopedics;  Laterality: Right;  Right Total Knee Arthroplasty    There were no vitals filed for this visit.   Subjective Assessment -  04/08/20 0848    Subjective Patient reports that her shoulder blade is feeling some better. She has a little catch in the shoulder when she turns her head to the Lt. Able to mow part of her yard on the riding mower with no flare up of shoulder pain.    Currently in Pain? Yes    Pain Score 1     Pain Location Shoulder    Pain Orientation Left;Posterior    Pain Descriptors / Indicators Dull;Aching    Pain Type Acute pain                             [facility] Adult PT Treatment/Exercise - 04/08/20 0001      Shoulder Exercises: Sidelying   External Rotation AROM;Strengthening;Left;10 reps    Other Sidelying Exercises scapular retraction pulling shoulder blade down and back       Shoulder Exercises: Standing   Retraction Strengthening;Both;20 reps;Theraband    Theraband Level (Shoulder Retraction) Level 1 (Yellow)    Other Standing Exercises scap squeeze 10 sec x 5 reps x 2 sets; L's x 20; W's x 10 with noodle      Other Standing Exercises reverse wall push up x 10  Shoulder Exercises: Pulleys   Flexion 2 minutes    ABduction 2 minutes      Shoulder Exercises: Stretch   Other Shoulder Stretches low and midlevel doorway stretch x 15 sec x       Moist Heat Therapy   Number Minutes Moist Heat 10 Minutes    Moist Heat Location Shoulder   thoracic spine Lt      Electrical Stimulation   Electrical Stimulation Location Lt periscapular area x 3; Lt pec x 1    Electrical Stimulation Action IFC    Electrical Stimulation Parameters to tolerance    Electrical Stimulation Goals Pain;Tone      Manual Therapy   Manual therapy comments skilled palpation for assessment of response to dry needling and manual work     Soft tissue mobilization pt Rt sidelying manual work through [employer], upper trap, levator and upper thoracic paraspinals.     Myofascial Release Lt thoracic spine     Scapular Mobilization Lt             Trigger Point Dry Needling - 04/08/20 0001     Consent Given? Yes    Education Handout Provided Previously provided    Dry Needling Comments Lt posterior shoulder girdle     Supraspinatus Response Palpable increased muscle length    Infraspinatus Response Palpable increased muscle length    Teres major Response Palpable increased muscle length    Teres minor Response Palpable increased muscle length                PT Education - 04/08/20 0905    Education Details HEP    Person(s) Educated Patient    Methods Explanation;Demonstration;Tactile cues;Verbal cues;Handout    Comprehension Verbalized understanding;Returned demonstration;Verbal cues required;Tactile cues required               PT Long Term Goals - 04/02/20 1624      PT LONG TERM GOAL #1   Title Independent with HEP for maintaining good posture.    Time 8    Status Revised    Target Date 05/06/20      PT LONG TERM GOAL #2   Title Pt able to perform ADLS without neck/shoulder pain 90% of the time.    Baseline 50% improvement reported.    Time 8    Period Weeks    Status Revised    Target Date 05/06/20      PT LONG TERM GOAL #3   Title Increase cervical mobility in lateral flexion and rotation by 5-7 degrees    Time 8    Period Weeks    Status Revised    Target Date 05/06/20      PT LONG TERM GOAL #4   Title Improved FOTO limitations to <= 33%    Baseline -    Time 8    Period Weeks    Status Revised    Target Date 05/06/20                 Plan - 04/08/20 0849    Clinical Impression Statement Patient reports that her shoulder is improving. She still has some pain with turning her head to the Lt but overall shoulder pain is decreased. She is sleeping better and increasing her activity at home. She is working on her exercises at home. Patient continues to have rounded posture and muscular tightness through the posterior shoulder girdle/scapular area. Added strengthening exercies without difficulty. Patient responds well to DN and manual  work. Progressing gradually toward stated goals of therapy.    Rehab Potential Good    PT Frequency 3x / week    PT Duration 6 weeks    PT Treatment/Interventions ADLs/Self Care Home Management;Cryotherapy;Electrical Stimulation;Moist Heat;Therapeutic exercise;Therapeutic activities;Neuromuscular re-education;Manual techniques;Patient/family education;Dry needling;Taping;Spinal Manipulations;Joint Manipulations    PT Next Visit Plan manual therapy vs DN to neck/UQ.  continue postural strengthening/ flexibility; progress with strengthening    PT Home Exercise Plan [email or web address]    Consulted and Agree with Plan of Care Patient           Patient will benefit from skilled therapeutic intervention in order to improve the following deficits and impairments:     Visit Diagnosis: Cervicalgia  Abnormal posture     Problem List Patient Active Problem List   Diagnosis Date Noted  . DDD (degenerative disc disease), cervical 03/03/2020  . BMI 28.0-28.9,adult 05/01/2019  . Chronic kidney disease (CKD), stage III (moderate) 03/30/2019  . Primary open angle glaucoma (POAG) of left eye, mild stage 11/05/2018  . 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 Presence of orthopedic joint implant 12/03/2013  . 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 DDD (degenerative disc disease), lumbar 01/05/2012  . Osteopenia 07/18/2010  . Asthma in adult 07/18/2010  . Backache 11/01/2009  . Anemia, unspecified 09/14/2009  . VITAMIN D DEFICIENCY 09/02/2009  . Hyperlipidemia 09/02/2009  . Essential hypertension 09/02/2009  . ALLERGIC RHINITIS 09/02/2009  . REFLUX ESOPHAGITIS 09/02/2009  . URINARY INCONTINENCE, 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    Zahir Eisenhour Rober Minion PT, MPH  04/08/2020, 9:27 AM  [facility] [address] [place] [address] [place], [place], [address] Phone: [phone]   Fax:   [phone]  Name: [name] MRN: [id number] Date of Birth: 1941/02/27

## 2020-04-13 ENCOUNTER — Other Ambulatory Visit: Payer: Self-pay

## 2020-04-13 ENCOUNTER — Encounter: Payer: Self-pay | Admitting: Rehabilitative and Restorative Service Providers"

## 2020-04-13 ENCOUNTER — Ambulatory Visit (INDEPENDENT_AMBULATORY_CARE_PROVIDER_SITE_OTHER): Payer: Medicare PPO | Admitting: Rehabilitative and Restorative Service Providers"

## 2020-04-13 DIAGNOSIS — M542 Cervicalgia: Secondary | ICD-10-CM

## 2020-04-13 DIAGNOSIS — R293 Abnormal posture: Secondary | ICD-10-CM | POA: Diagnosis not present

## 2020-04-13 NOTE — Therapy (Signed)
[facility] [address] [place], [place], [address] Phone: [phone]   Fax:  [phone]  Physical Therapy Treatment  Progress Note Reporting Period 03/11/20 to 04/13/20  See note below for Objective Data and Assessment of Progress/Goals.       Patient Details  Name: [name] MRN: [id number] Date of Birth: [birth year] Referring Provider (PT): Dr.Thekkekandam   Encounter Date: 04/13/2020   PT End of Session - 04/13/20 0849    Visit Number 10    Number of Visits 20    Date for PT Re-Evaluation 05/06/20    Authorization Type [employer] medicare    Authorization Time Period 03/11/20-05/06/20    Authorization - Visit Number 10    Authorization - Number of Visits 20    Progress Note Due on Visit 10    PT Start Time 0843    PT Stop Time 0935    PT Time Calculation (min) 52 min    Activity Tolerance Patient tolerated treatment well           Past Medical History:  Diagnosis Date  . Arthritis    "back; left hip; knees" (12/04/2013)  . Asthma   . Chronic bronchitis ([place])    "usually get it twice/yr" (12/04/2013)  . Diabetes mellitus without complication ([place])   . Environmental and seasonal allergies   . GERD (gastroesophageal reflux disease)   . High cholesterol   . Hypertension    pcp   [facility]     . Pneumonia    as a child    Past Surgical History:  Procedure Laterality Date  . ABDOMINAL HYSTERECTOMY    . BLADDER SUSPENSION    . COLONOSCOPY    . JOINT REPLACEMENT    . SHOULDER ARTHROSCOPY W/ ROTATOR CUFF REPAIR Right   . 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   Procedure: TOTAL HIP ARTHROPLASTY;  Surgeon: Newt Minion, MD;  Location: [facility];  Service: Orthopedics;  Laterality: Left;  Left Total Hip Arthroplasty  . 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   Procedure: TOTAL KNEE ARTHROPLASTY;  Surgeon: Newt Minion, MD;  Location: [facility];  Service:  Orthopedics;  Laterality: Right;  Right Total Knee Arthroplasty    There were no vitals filed for this visit.   Subjective Assessment - 04/13/20 0849    Subjective Improving - notices less pain in the Lt shoulder - still has some catch in the Lt shoulder when she turns her head to the Lt. Shoulder and neck are doing a lot better    Currently in Pain? Yes    Pain Score 1     Pain Location Shoulder    Pain Orientation Left;Posterior    Pain Descriptors / Indicators Dull;Aching              [facility] PT Assessment - 04/13/20 0001      Assessment   Medical Diagnosis cervical DDD    Referring Provider (PT) Dr.Thekkekandam    Onset Date/Surgical Date 02/26/20    Hand Dominance Right    Next MD Visit PRN    Prior Therapy none for shoulder/neck       AROM   Overall AROM Comments tightness with cervical motions    AROM Assessment Site --   tight end ranges bilat shoulder elevation    Cervical Flexion 52    Cervical Extension 41    Cervical - Right Side Bend 22    Cervical -  Left Side Bend 25    Cervical - Right Rotation 63    Cervical - Left Rotation 60      Palpation   Palpation comment muscular tightness through te Lt supraspinitus; infraspinitus; periscapular musculature; bilat upper trap; leveator; pecs                         [facility] Adult PT Treatment/Exercise - 04/13/20 0001      Shoulder Exercises: Sidelying   External Rotation AROM;Strengthening;Left;10 reps    Other Sidelying Exercises scapular retraction pulling shoulder blade down and back 10 sec hold x 10     Other Sidelying Exercises scapular depression pulling shoulder blade inferiorly10 sec hold x 10       Shoulder Exercises: Standing   Retraction Strengthening;Both;20 reps;Theraband    Theraband Level (Shoulder Retraction) Level 1 (Yellow)    Other Standing Exercises scap squeeze 10 sec x 5 reps x 2 sets; L's x 20; W's x 10 with noodle      Other Standing Exercises reverse wall push up x 10         Shoulder Exercises: Pulleys   Flexion 2 minutes    ABduction 2 minutes      Shoulder Exercises: Stretch   Other Shoulder Stretches low and midlevel doorway stretch x 20 sec x 2 reps each position       Moist Heat Therapy   Number Minutes Moist Heat 12 Minutes    Moist Heat Location Shoulder   thoracic spine Lt      Electrical Stimulation   Electrical Stimulation Location Lt periscapular area x 3; Lt pec x 1    Electrical Stimulation Action IFC    Electrical Stimulation Parameters t tolerance     Electrical Stimulation Goals Pain;Tone      Manual Therapy   Manual therapy comments skilled palpation for assessment of response to dry needling and manual work     Soft tissue mobilization pt Rt sidelying manual work through [employer], upper trap, levator and upper thoracic paraspinals.     Myofascial Release Lt thoracic spine     Scapular Mobilization Lt             Trigger Point Dry Needling - 04/13/20 0001    Consent Given? Yes    Education Handout Provided Previously provided    Dry Needling Comments Lt posterior shoulder girdle     Upper Trapezius Response Palpable increased muscle length    Supraspinatus Response Palpable increased muscle length    Infraspinatus Response Palpable increased muscle length    Teres major Response Palpable increased muscle length    Teres minor Response Palpable increased muscle length                     PT Long Term Goals - 04/13/20 0925      PT LONG TERM GOAL #1   Title Independent with HEP for maintaining good posture.    Time 8    Period Weeks    Status On-going      PT LONG TERM GOAL #2   Title Pt able to perform ADLS without neck/shoulder pain 90% of the time.    Baseline 80% improvement    Time 8    Period Weeks    Status On-going      PT LONG TERM GOAL #3   Title Increase cervical mobility in lateral flexion and rotation by 5-7 degrees    Time 8  Period Weeks    Status Partially Met      PT LONG  TERM GOAL #4   Period Weeks    Status On-going                 Plan - 04/13/20 7035    Clinical Impression Statement Good improvement in symptoms in cervical spine and Lt posterior shoulder girdle. Patient reports decreased pain and increased mobility/ROM/function. She demonstrates increased ROM and decreased palpable tightness through shoulder girdle musculature. Excellent response to DN and manual work. Gradually progressing with posterior shoulder girdle strengthening. Will benefit from continued treatment to achieve goals and reach maximum rehab potential.    Rehab Potential Good    PT Frequency 3x / week    PT Duration 6 weeks    PT Treatment/Interventions ADLs/Self Care Home Management;Cryotherapy;Electrical Stimulation;Moist Heat;Therapeutic exercise;Therapeutic activities;Neuromuscular re-education;Manual techniques;Patient/family education;Dry needling;Taping;Spinal Manipulations;Joint Manipulations    PT Next Visit Plan manual therapy vs DN to neck/UQ.  continue postural strengthening/ flexibility; progress with strengthening    PT Home Exercise Plan [email or web address]    Consulted and Agree with Plan of Care Patient           Patient will benefit from skilled therapeutic intervention in order to improve the following deficits and impairments:     Visit Diagnosis: Cervicalgia  Abnormal posture     Problem List Patient Active Problem List   Diagnosis Date Noted  . DDD (degenerative disc disease), cervical 03/03/2020  . BMI 28.0-28.9,adult 05/01/2019  . Chronic kidney disease (CKD), stage III (moderate) 03/30/2019  . Primary open angle glaucoma (POAG) of left eye, mild stage 11/05/2018  . 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 Presence of orthopedic joint implant 12/03/2013  . 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[place]) 01/18/2012  . DDD (degenerative disc disease), lumbar 01/05/2012    . Osteopenia 07/18/2010  . Asthma in adult 07/18/2010  . Backache 11/01/2009  . Anemia, unspecified 09/14/2009  . VITAMIN D DEFICIENCY 09/02/2009  . Hyperlipidemia 09/02/2009  . Essential hypertension 09/02/2009  . ALLERGIC RHINITIS 09/02/2009  . REFLUX ESOPHAGITIS 09/02/2009  . URINARY INCONTINENCE, 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    Shauntay Brunelli Nilda Simmer PT, MPH  04/13/2020, 9:32 AM  [facility] [place], [place], [address] Phone: [phone]   Fax:  [phone]  Name: [name] MRN: [id number] Date of Birth: 1940/09/20

## 2020-04-14 DIAGNOSIS — L308 Other specified dermatitis: Secondary | ICD-10-CM | POA: Diagnosis not present

## 2020-04-15 ENCOUNTER — Other Ambulatory Visit: Payer: Self-pay

## 2020-04-15 ENCOUNTER — Encounter: Payer: Self-pay | Admitting: Rehabilitative and Restorative Service Providers"

## 2020-04-15 ENCOUNTER — Ambulatory Visit (INDEPENDENT_AMBULATORY_CARE_PROVIDER_SITE_OTHER): Payer: Medicare PPO | Admitting: Rehabilitative and Restorative Service Providers"

## 2020-04-15 DIAGNOSIS — M542 Cervicalgia: Secondary | ICD-10-CM

## 2020-04-15 DIAGNOSIS — R293 Abnormal posture: Secondary | ICD-10-CM | POA: Diagnosis not present

## 2020-04-15 NOTE — Patient Instructions (Signed)
Access Code: [email or web address]: [email or web address]: 07/29/2021Prepared by: Lila Lufkin HoltExercises  Doorway Pec Stretch at 90 Degrees Abduction - 2 x daily - 7 x weekly - 1 sets - 3 reps - 30-60 seconds hold  Seated Scapular Retraction - 1 x daily - 7 x weekly - 1-3 sets - 10 reps - 2-3 sec hold  Standing Isometric Cervical Sidebending with Manual Resistance - 1 x daily - 7 x weekly - 1 sets - 5 reps - 10 sec hold hold  Standing Shoulder External Rotation with Resistance - 2 x daily - 7 x weekly - 1-2 sets - 10 reps - 2-3 sec hold  Seated Shoulder Row with Resistance Anchored at Feet - 2 x daily - 7 x weekly - 1-2 sets - 10 reps - 2-3 sec hold  Isometric Shoulder External Rotation at Wall - 2 x daily - 7 x weekly - 1-2 sets - 10 reps - 5 sec hold  Standing Isometric Shoulder Internal Rotation at Doorway - 2 x daily - 7 x weekly - 1 sets - 10 reps - 5 sec hold  Standing Shoulder Internal Rotation with Anchored Resistance - 2 x daily - 7 x weekly - 1-2 sets - 10 reps - 3 sec hold  Shoulder External Rotation with Anchored Resistance - 2 x daily - 7 x weekly - 1-2 sets - 10 reps - 3 sec hold  Sidelying Shoulder External Rotation Dumbbell - 2 x daily - 7 x weekly - 1-2 sets - 10 reps - 3 sec hold

## 2020-04-15 NOTE — Therapy (Signed)
[facility] [place], [place], [address] Phone: [phone]   Fax:  [phone]  Physical Therapy Treatment  Patient Details  Name: ADRIELLA ESSEX MRN: [id number] Date of Birth: 12-Jun-1941 Referring Provider (PT): Dr.Thekkekandam   Encounter Date: 04/15/2020   PT End of Session - 04/15/20 1020    Visit Number 11    Number of Visits 20    Date for PT Re-Evaluation 05/06/20    Authorization Type [employer] medicare    Authorization Time Period 03/11/20-05/06/20    Authorization - Visit Number 11    Authorization - Number of Visits 20    Progress Note Due on Visit 20    PT Start Time 1018    PT Stop Time 1108    PT Time Calculation (min) 50 min    Activity Tolerance Patient tolerated treatment well           Past Medical History:  Diagnosis Date   Arthritis    "back; left hip; knees" (12/04/2013)   Asthma    Chronic bronchitis ([place])    "usually get it twice/yr" (12/04/2013)   Diabetes mellitus without complication ([facility])    Environmental and seasonal allergies    GERD (gastroesophageal reflux disease)    High cholesterol    Hypertension    pcp   [facility]      Pneumonia    as a child    Past Surgical History:  Procedure Laterality Date   ABDOMINAL HYSTERECTOMY     BLADDER SUSPENSION     COLONOSCOPY     JOINT REPLACEMENT     SHOULDER ARTHROSCOPY W/ ROTATOR CUFF REPAIR Right    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   Procedure: TOTAL HIP ARTHROPLASTY;  Surgeon: Newt Minion, MD;  Location: [facility];  Service: Orthopedics;  Laterality: Left;  Left Total Hip Arthroplasty   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   Procedure: TOTAL KNEE ARTHROPLASTY;  Surgeon: Newt Minion, MD;  Location: [facility];  Service: Orthopedics;  Laterality: Right;  Right Total Knee Arthroplasty    There were no vitals filed for this visit.   Subjective Assessment -  04/15/20 1023    Subjective Patient reports the Lt shoulder is feeling OK - just that "dull aching in that one spot" - shoulder blade area    Currently in Pain? Yes    Pain Score 1     Pain Location Shoulder    Pain Orientation Left;Posterior    Pain Descriptors / Indicators Dull;Aching    Pain Onset More than a month ago    Pain Frequency Intermittent    Aggravating Factors  using Lt UE for functional activities    Pain Relieving Factors heat; DN; massage    Effect of Pain on Daily Activities dull ache                             [facility] Adult PT Treatment/Exercise - 04/15/20 0001      Shoulder Exercises: Sidelying   External Rotation AROM;Strengthening;Left;10 reps;Weights    External Rotation Weight (lbs) 2    Other Sidelying Exercises scapular retraction pulling shoulder blade down and back 10 sec hold x 10     Other Sidelying Exercises scapular depression pulling shoulder blade inferiorly10 sec hold x 10       Shoulder Exercises: Standing   External Rotation Strengthening;Left;10 reps;Theraband   shoulder at  side elbow at 90 deg    Theraband Level (Shoulder External Rotation) Level 2 (Red)    Internal Rotation Strengthening;Left;10 reps;Theraband    Theraband Level (Shoulder Internal Rotation) Level 2 (Red)    Internal Rotation Limitations from ER position to neurtal - avoiding crossing midline and moving into poor scapular position      Shoulder Exercises: ROM/Strengthening   UBE (Upper Arm Bike) L3 x 4 min alternating fwd/back       Shoulder Exercises: Isometric Strengthening   External Rotation Limitations isometric shoulder at side elbow 90 deg 5 sec hold x 10    Internal Rotation Limitations isometric shoulder at side elbow at 90 deg 5 sec hold x 10 reps       Shoulder Exercises: Stretch   Other Shoulder Stretches low and midlevel doorway stretch x 20 sec x 2 reps each position       Moist Heat Therapy   Number Minutes Moist Heat 12 Minutes    Moist  Heat Location Shoulder   thoracic spine Lt      Electrical Stimulation   Electrical Stimulation Location Lt posterior shoulder girdle     Electrical Stimulation Action IFC    Electrical Stimulation Parameters to tolerance    Electrical Stimulation Goals Pain;Tone      Manual Therapy   Manual therapy comments skilled palpation for assessment of response to dry needling and manual work     Soft tissue mobilization pt Rt sidelying manual work through [employer], upper trap, levator and upper thoracic paraspinals.     Myofascial Release Lt thoracic spine     Scapular Mobilization Lt             Trigger Point Dry Needling - 04/15/20 0001    Consent Given? Yes    Education Handout Provided Previously provided    Dry Needling Comments Lt posterior shoulder girdle     Electrical Stimulation Performed with Dry Needling Yes    Infraspinatus Response Palpable increased muscle length    Teres major Response Palpable increased muscle length    Teres minor Response Palpable increased muscle length                PT Education - 04/15/20 1207    Education Details HEP    Person(s) Educated Patient    Methods Explanation;Demonstration;Tactile cues;Verbal cues;Handout    Comprehension Verbalized understanding;Returned demonstration;Verbal cues required;Tactile cues required               PT Long Term Goals - 04/13/20 0925      PT LONG TERM GOAL #1   Title Independent with HEP for maintaining good posture.    Time 8    Period Weeks    Status On-going      PT LONG TERM GOAL #2   Title Pt able to perform ADLS without neck/shoulder pain 90% of the time.    Baseline 80% improvement    Time 8    Period Weeks    Status On-going      PT LONG TERM GOAL #3   Title Increase cervical mobility in lateral flexion and rotation by 5-7 degrees    Time 8    Period Weeks    Status Partially Met      PT LONG TERM GOAL #4   Period Weeks    Status On-going                  Plan - 04/15/20 1025    Clinical Impression  Statement Continued dull ache in Lt posterior shoulder area. Added strengthening exercises for IR/ER. Continued DN/manual work as indicated. Assess for continued treatment vs d/c at next visit. 12 visits apporved by [facility] Medicare    Rehab Potential Good    PT Frequency 3x / week    PT Duration 6 weeks    PT Treatment/Interventions ADLs/Self Care Home Management;Cryotherapy;Electrical Stimulation;Moist Heat;Therapeutic exercise;Therapeutic activities;Neuromuscular re-education;Manual techniques;Patient/family education;Dry needling;Taping;Spinal Manipulations;Joint Manipulations    PT Next Visit Plan manual therapy vs DN to neck/upper quarter.  continue postural strengthening/ flexibility; progress with strengthening    PT Home Exercise Plan [email or web address]    Consulted and Agree with Plan of Care Patient           Patient will benefit from skilled therapeutic intervention in order to improve the following deficits and impairments:     Visit Diagnosis: Cervicalgia  Abnormal posture     Problem List Patient Active Problem List   Diagnosis Date Noted   DDD (degenerative disc disease), cervical 03/03/2020   BMI 28.0-28.9,adult 05/01/2019   Chronic kidney disease (CKD), stage III (moderate) 03/30/2019   Primary open angle glaucoma (POAG) of left eye, mild stage 11/05/2018   Combined forms of age-related cataract of right eye 12/31/2014   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   Presence of orthopedic joint implant 12/03/2013   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[place]) 01/18/2012   DDD (degenerative disc disease), lumbar 01/05/2012   Osteopenia 07/18/2010   Asthma in adult 07/18/2010   Backache 11/01/2009   Anemia, unspecified 09/14/2009   VITAMIN D DEFICIENCY 09/02/2009   Hyperlipidemia 09/02/2009   Essential hypertension 09/02/2009   ALLERGIC RHINITIS 09/02/2009   REFLUX ESOPHAGITIS  09/02/2009   URINARY INCONTINENCE, 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    Calena Salem Nilda Simmer PT, MPH  04/15/2020, 12:09 PM  [facility] [place] [address] [place], [place], [address] Phone: [phone]   Fax:  [phone]  Name: JENNICA TAGLIAFERRI MRN: [id number] Date of Birth: 1941/09/17

## 2020-04-17 ENCOUNTER — Emergency Department
Admission: EM | Admit: 2020-04-17 | Discharge: 2020-04-17 | Disposition: A | Discharge status: Home / Self Care | Payer: Medicare PPO | Source: Home / Self Care

## 2020-04-17 ENCOUNTER — Encounter: Payer: Self-pay | Admitting: Emergency Medicine

## 2020-04-17 ENCOUNTER — Other Ambulatory Visit: Payer: Self-pay

## 2020-04-17 DIAGNOSIS — M549 Dorsalgia, unspecified: Secondary | ICD-10-CM

## 2020-04-17 DIAGNOSIS — M25512 Pain in left shoulder: Secondary | ICD-10-CM

## 2020-04-17 MED ORDER — PREDNISONE 50 MG PO TABS
50.0000 mg | ORAL_TABLET | Freq: Every day | ORAL | 0 refills | Status: AC
Start: 2020-04-17 — End: 2020-04-22

## 2020-04-17 NOTE — ED Provider Notes (Signed)
[name] CARE    CSN: [id number] Arrival date & time: 04/17/20  6644      History   Chief Complaint Chief Complaint  Patient presents with  . Shoulder Pain    HPI [name] is a 79 y.o. female.   HPI [name] is a 79 y.o. female presenting to UC with c/o exacerbation of Left upper back/shoulder pain after going to PT 2 days ago.  Pain is 7/10, aching, difficult for pt to reach to apply muscle rub.  She has taken Extra Strength Tylenol with mild relief.  Denies weakness or numbness in her Left arm.  Pt states she received dry needling and applied heat after like she usually does but the pain has been more persistent and severe than it usually is after a PT session.  She was initially seen by Dr. Dianah Field for the same on 03/03/20. She received a 5 day course of prednisone, which improved her pain significantly.  Pt still has a few more sessions of PT left.      Past Medical History:  Diagnosis Date  . Arthritis    "back; left hip; knees" (12/04/2013)  . Asthma   . Chronic bronchitis ([place])    "usually get it twice/yr" (12/04/2013)  . Diabetes mellitus without complication ([place])   . Environmental and seasonal allergies   . GERD (gastroesophageal reflux disease)   . High cholesterol   . Hypertension    pcp   [facility]     . Pneumonia    as a child    Patient Active Problem List   Diagnosis Date Noted  . DDD (degenerative disc disease), cervical 03/03/2020  . BMI 28.0-28.9,adult 05/01/2019  . Chronic kidney disease (CKD), stage III (moderate) 03/30/2019  . Primary open angle glaucoma (POAG) of left eye, mild stage 11/05/2018  . 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 Presence of orthopedic joint implant 12/03/2013  . 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[place]) 01/18/2012  . DDD (degenerative disc disease), lumbar 01/05/2012  . Osteopenia 07/18/2010  .  Asthma in adult 07/18/2010  . Backache 11/01/2009  . Anemia, unspecified 09/14/2009  . VITAMIN D DEFICIENCY 09/02/2009  . Hyperlipidemia 09/02/2009  . Essential hypertension 09/02/2009  . ALLERGIC RHINITIS 09/02/2009  . REFLUX ESOPHAGITIS 09/02/2009  . 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 ABDOMINAL HYSTERECTOMY    . BLADDER SUSPENSION    . COLONOSCOPY    . JOINT REPLACEMENT    . SHOULDER ARTHROSCOPY W/ ROTATOR CUFF REPAIR Right   . 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   Procedure: TOTAL HIP ARTHROPLASTY;  Surgeon: Newt Minion, MD;  Location: [facility];  Service: Orthopedics;  Laterality: Left;  Left Total Hip Arthroplasty  . 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   Procedure: TOTAL KNEE ARTHROPLASTY;  Surgeon: Newt Minion, MD;  Location: [facility];  Service: Orthopedics;  Laterality: Right;  Right Total Knee Arthroplasty    OB History   No obstetric history on file.      Home Medications    Prior to Admission medications   Medication Sig Start Date End Date Taking? Authorizing Provider  Accu-Chek FastClix Lancets MISC For checking blood sugars up to 3 times daily. E11.49 05/06/19   Hali Marry, MD  ACCU-CHEK GUIDE test strip Test twice daily.  DX: E11.49 05/28/19  Hali Marry, MD  aspirin 81 MG chewable tablet Chew 1 tablet by mouth daily. 07/03/18   [provider]  blood glucose meter kit and supplies Dispense based on patient and insurance preference. For testing up to twice daily. E11.49 05/01/19   Hali Marry, MD  carvedilol (COREG) 6.25 MG tablet Take 1 tablet (6.25 mg total) by mouth 2 (two) times daily with a meal. 12/25/19   Breeback, Jade L, PA-C  cetirizine (ZYRTEC) 10 MG tablet Take 10 mg by mouth daily.    [provider]  dorzolamide (TRUSOPT) 2 % ophthalmic solution Place 1 drop into both eyes 2 (two) times daily. 03/10/19    [provider]  gabapentin (NEURONTIN) 300 MG capsule Take 1 capsule (300 mg total) by mouth 2 (two) times daily. 12/17/19   Hali Marry, MD  hydrochlorothiazide (HYDRODIURIL) 25 MG tablet TAKE 1 TABLET(25 MG) BY MOUTH DAILY 01/26/20   Hali Marry, MD  ipratropium-albuterol (DUONEB) 0.5-2.5 (3) MG/3ML SOLN Take 3 mLs by nebulization every 6 (six) hours as needed. 12/17/19   Hali Marry, MD  metFORMIN (GLUCOPHAGE) 1000 MG tablet Take 1 tablet (1,000 mg total) by mouth 2 (two) times daily with a meal. 03/15/20   Breeback, Jade L, PA-C  mineral oil-hydrophilic petrolatum (AQUAPHOR) ointment Apply topically as needed for dry skin. Apply to hands and arms 4 times a day 08/21/19   Fransico Meadow, PA-C  predniSONE (DELTASONE) 50 MG tablet Take 1 tablet (50 mg total) by mouth daily with breakfast for 5 days. 04/17/20 04/22/20  Noe Gens, PA-C  simvastatin (ZOCOR) 80 MG tablet Take 1 tablet (80 mg total) by mouth at bedtime. 03/23/20   Hali Marry, MD  sitaGLIPtin (JANUVIA) 25 MG tablet Take 1 tablet (25 mg total) by mouth daily. 12/22/19   Hali Marry, MD  triamcinolone ointment (KENALOG) 0.5 % Apply 1 application topically 2 (two) times daily as needed. For rash and itching. 01/27/20   Hali Marry, MD    Family History Family History  Problem Relation Age of Onset  . Hypertension Mother   . Heart disease Mother   . Transient ischemic attack Mother   . CAD Father   . Cancer - Ovarian Sister   . Diabetes Daughter     Social History Social History   Tobacco Use  . Smoking status: Never Smoker  . Smokeless tobacco: Never Used  Substance Use Topics  . Alcohol use: No  . Drug use: No     Allergies   Losartan, Nsaids, Levofloxacin, Oxycodone hcl, Verapamil, Biaxin [clarithromycin], Oxycontin [oxycodone], and Vicodin [hydrocodone-acetaminophen]   Review of Systems Review of Systems  Musculoskeletal: Positive for arthralgias and  back pain. Negative for neck pain and neck stiffness.  Skin: Negative for color change and wound.  Neurological: Negative for weakness and numbness.     Physical Exam Triage Vital Signs ED Triage Vitals  Enc Vitals Group     BP 04/17/20 0843 (!) 152/78     Pulse Rate 04/17/20 0843 62     Resp 04/17/20 0843 16     Temp 04/17/20 0843 97.7 F (36.5 C)     Temp Source 04/17/20 0843 Oral     SpO2 04/17/20 0843 99 %     Weight 04/17/20 0845 170 lb (77.1 kg)     Height 04/17/20 0845 _0  (1.651 m)     Head Circumference --      Peak Flow --  Pain Score 04/17/20 0844 7     Pain Loc --      Pain Edu? --      Excl. in [place]? --    No data found.  Updated Vital Signs BP (!) 152/78 (BP Location: Right Arm)   Pulse 62   Temp 97.7 F (36.5 C) (Oral)   Resp 16   Ht '5\' 5"'$  (1.651 m)   Wt 170 lb (77.1 kg)   SpO2 99%   BMI 28.29 kg/m   Visual Acuity Right Eye Distance:   Left Eye Distance:   Bilateral Distance:    Right Eye Near:   Left Eye Near:    Bilateral Near:     Physical Exam Vitals and nursing note reviewed.  Constitutional:      Appearance: Normal appearance. She is well-developed.  HENT:     Head: Normocephalic and atraumatic.  Cardiovascular:     Rate and Rhythm: Normal rate and regular rhythm.     Pulses:          Radial pulses are 2+ on the left side.  Pulmonary:     Effort: Pulmonary effort is normal.  Musculoskeletal:        General: Tenderness present. Normal range of motion.     Cervical back: Normal range of motion.       Back:     Comments: No spinal tenderness. Increased pain with full abduction and adduction of Left shoulder.  Skin:    General: Skin is warm and dry.     Capillary Refill: Capillary refill takes less than 2 seconds.  Neurological:     Mental Status: She is alert and oriented to person, place, and time.     Sensory: No sensory deficit.  Psychiatric:        Behavior: Behavior normal.      UC Treatments / Results   Labs (all labs ordered are listed, but only abnormal results are displayed) Labs Reviewed - No data to display  EKG   Radiology No results found.  Procedures Procedures (including critical care time)  Medications Ordered in UC Medications - No data to display  Initial Impression / Assessment and Plan / UC Course  I have reviewed the triage vital signs and the nursing notes.  Pertinent labs & imaging results that were available during my care of the patient were reviewed by me and considered in my medical decision making (see chart for details).     Reviewed medical records, will start pt on another 5 days of prednisone, last course was about 6 weeks ago, she did well. Encouraged to call Dr. Dianah Field Monday to schedule f/u appointment. Per medical records, MRI may be ordered if not improinvg AVS provided  Final Clinical Impressions(s) / UC Diagnoses   Final diagnoses:  Acute pain of left shoulder  Upper back pain on left side     Discharge Instructions      Please take the prednisone as prescribed.  Call Dr. Dianah Field office on Monday to let him know you are having worsening pain.  He may want to order additional imaging such as an MRI, according to his note from June, if your pain was not improving. Also, let physical therapy know the last visit worsened symptoms so they can change the treatments used during your next visit.  The prednisone can cause your blood pressure and blood sugar to increase so be sure to monitor both while taking this medication. Take with food to help protect your stomach.  ED Prescriptions    Medication Sig Dispense Auth. Provider   predniSONE (DELTASONE) 50 MG tablet Take 1 tablet (50 mg total) by mouth daily with breakfast for 5 days. 5 tablet Noe Gens, PA-C     PDMP not reviewed this encounter.   Noe Gens, [place] 04/17/20 [phone]

## 2020-04-17 NOTE — ED Triage Notes (Signed)
Patient has been having left shoulder/scapula pain for about a month; seen by Dr.T; doin PT x2/week; 3 days ago treatment changed and she has had excruciating pain in area and cannot get comfortable even to sleep. Can only take acetaminophen and did so HS without relief. Has had covid vaccination.

## 2020-04-17 NOTE — Discharge Instructions (Addendum)
  Please take the prednisone as prescribed.  Call Dr. Benjamin Stain office on Monday to let him know you are having worsening pain.  He may want to order additional imaging such as an MRI, according to his note from June, if your pain was not improving. Also, let physical therapy know the last visit worsened symptoms so they can change the treatments used during your next visit.  The prednisone can cause your blood pressure and blood sugar to increase so be sure to monitor both while taking this medication. Take with food to help protect your stomach.

## 2020-04-20 ENCOUNTER — Other Ambulatory Visit: Payer: Self-pay

## 2020-04-20 ENCOUNTER — Encounter: Payer: Self-pay | Admitting: Physical Therapy

## 2020-04-20 ENCOUNTER — Ambulatory Visit (INDEPENDENT_AMBULATORY_CARE_PROVIDER_SITE_OTHER): Payer: Medicare PPO | Admitting: Physical Therapy

## 2020-04-20 DIAGNOSIS — M542 Cervicalgia: Secondary | ICD-10-CM | POA: Diagnosis not present

## 2020-04-20 DIAGNOSIS — R293 Abnormal posture: Secondary | ICD-10-CM | POA: Diagnosis not present

## 2020-04-20 NOTE — Therapy (Signed)
[facility] [place], [place], [address] Phone: [phone]   Fax:  [phone]  Physical Therapy Treatment  Patient Details  Name: [name] MRN: [id number] Date of Birth: [birth year] Referring Provider (PT): Dr.Thekkekandam   Encounter Date: 04/20/2020   PT End of Session - 04/20/20 0811    Visit Number 12    Number of Visits 20    Date for PT Re-Evaluation 05/06/20    Authorization Type [employer] medicare    Authorization Time Period 03/11/20-05/06/20    Authorization - Visit Number 12    Authorization - Number of Visits 20    Progress Note Due on Visit 20    PT Start Time 0804    PT Stop Time 4332    PT Time Calculation (min) 40 min    Activity Tolerance Patient tolerated treatment well           Past Medical History:  Diagnosis Date  . Arthritis    "back; left hip; knees" (12/04/2013)  . Asthma   . Chronic bronchitis ([place])    "usually get it twice/yr" (12/04/2013)  . Diabetes mellitus without complication ([place])   . Environmental and seasonal allergies   . GERD (gastroesophageal reflux disease)   . High cholesterol   . Hypertension    pcp   [facility]     . Pneumonia    as a child    Past Surgical History:  Procedure Laterality Date  . ABDOMINAL HYSTERECTOMY    . BLADDER SUSPENSION    . COLONOSCOPY    . JOINT REPLACEMENT    . SHOULDER ARTHROSCOPY W/ ROTATOR CUFF REPAIR Right   . 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   Procedure: TOTAL HIP ARTHROPLASTY;  Surgeon: Newt Minion, MD;  Location: [facility];  Service: Orthopedics;  Laterality: Left;  Left Total Hip Arthroplasty  . 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   Procedure: TOTAL KNEE ARTHROPLASTY;  Surgeon: Newt Minion, MD;  Location: [facility];  Service: Orthopedics;  Laterality: Right;  Right Total Knee Arthroplasty    There were no vitals filed for this visit.   Subjective Assessment -  04/20/20 0812    Subjective Pt reports she had a flare up of pain around her Lt shoulder blade, up to 10/10. Went to urgent care and has been taking 71m of Prednisone daily, last dose is tomorrow.  Shoulder has finally calmed down.    Pertinent History DM, OA, HTN, low back pain, Rt RCR, Rt TKA, Lt THA    Patient Stated Goals Get it all better (no neck/shoulder pain)    Currently in Pain? No/denies    Pain Score 0-No pain              [facility] PT Assessment - 04/20/20 0001      Assessment   Medical Diagnosis cervical DDD    Referring Provider (PT) Dr.Thekkekandam    Onset Date/Surgical Date 02/26/20    Hand Dominance Right    Next MD Visit PRN    Prior Therapy none for shoulder/neck       Observation/Other Assessments   Focus on Therapeutic Outcomes (FOTO)  43% limited      AROM   Cervical - Right Side Bend 23    Cervical - Left Side Bend 21            [facility] 0001      Neck Exercises:  Supine   Cervical Rotation Right;Left;5 reps   with head nods for neck stretch     Shoulder Exercises: Supine   Horizontal ABduction Strengthening;Both;12 reps    Theraband Level (Shoulder Horizontal ABduction) Level 1 (Yellow)    Flexion Both;10 reps    Theraband Level (Shoulder Flexion) Level 1 (Yellow)   overhead stretch   Diagonals Both;10 reps;Theraband    Theraband Level (Shoulder Diagonals) Level 1 (Yellow)    Other Supine Exercises chest lift 5 reps x 10 sec x 2 sets       Shoulder Exercises: Seated   Other Seated Exercises shoulder rolls x 10      Shoulder Exercises: Standing   Extension Both;Theraband;12 reps    Row Both;10 reps;Theraband   3 sec in retraction   Theraband Level (Shoulder Row) Level 2 (Red)      Shoulder Exercises: ROM/Strengthening   UBE (Upper Arm Bike) L1: 1 min forward/ 1 min backward standing       Shoulder Exercises: Stretch   Corner Stretch 3 reps;20 seconds      Manual Therapy   Soft tissue mobilization pt  Rt sidelying manual work through [employer], upper trap, levator and upper thoracic paraspinals.       Neck Exercises: Stretches   Upper Trapezius Stretch Right;Left;10 seconds;4 reps                       PT Long Term Goals - 04/20/20 0816      PT LONG TERM GOAL #1   Title Independent with HEP for maintaining good posture.    Time 8    Period Weeks    Status Partially Met      PT LONG TERM GOAL #2   Title Pt able to perform ADLS without neck/shoulder pain 90% of the time.    Baseline --    Time 8    Period Weeks    Status Achieved      PT LONG TERM GOAL #3   Title Increase cervical mobility in lateral flexion and rotation by 5-7 degrees    Time 8    Period Weeks    Status Partially Met      PT LONG TERM GOAL #4   Baseline 43% limited, improved from 47% limited.    Period Weeks    Status On-going                 Plan - 04/20/20 0829    Clinical Impression Statement Pt reporting improved ability ability to perform ADLs with less pain. She is still limited with tasks like sweeping and vacuuming, which increase pain in her Lt periscapular area.  Lateral cervical flexion continues to be limited; cervical rotation is WNL.  Pt has partially met her goals and will benefit from continued PT intervention to maximize mobility with less pain.    Rehab Potential Good    PT Frequency 3x / week    PT Duration 6 weeks    PT Treatment/Interventions ADLs/Self Care Home Management;Cryotherapy;Electrical Stimulation;Moist Heat;Therapeutic exercise;Therapeutic activities;Neuromuscular re-education;Manual techniques;Patient/family education;Dry needling;Taping;Spinal Manipulations;Joint Manipulations    PT Next Visit Plan continue postural strengthening/ flexibility; progress with strengthening    PT Home Exercise Plan [email or web address]    Consulted and Agree with Plan of Care Patient           Patient will benefit from skilled therapeutic intervention in order to improve  the following deficits and impairments:  Pain, Impaired flexibility, Postural dysfunction, Decreased strength  Visit Diagnosis: Cervicalgia  Abnormal posture     Problem List Patient Active Problem List   Diagnosis Date Noted  . DDD (degenerative disc disease), cervical 03/03/2020  . BMI 28.0-28.9,adult 05/01/2019  . Chronic kidney disease (CKD), stage III (moderate) 03/30/2019  . Primary open angle glaucoma (POAG) of left eye, mild stage 11/05/2018  . 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 Presence of orthopedic joint implant 12/03/2013  . 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[place]) 01/18/2012  . DDD (degenerative disc disease), lumbar 01/05/2012  . Osteopenia 07/18/2010  . Asthma in adult 07/18/2010  . Backache 11/01/2009  . Anemia, unspecified 09/14/2009  . VITAMIN D DEFICIENCY 09/02/2009  . Hyperlipidemia 09/02/2009  . Essential hypertension 09/02/2009  . ALLERGIC RHINITIS 09/02/2009  . REFLUX ESOPHAGITIS 09/02/2009  . URINARY INCONTINENCE, 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   Kerin Perna, PTA 04/20/20 10:53 AM  [facility] [address] [place], [place], [address] Phone: [phone]   Fax:  [phone]  Name: [name] MRN: [id number] Date of Birth: May 28, 1941

## 2020-04-22 ENCOUNTER — Encounter: Payer: Self-pay | Admitting: Rehabilitative and Restorative Service Providers"

## 2020-04-22 ENCOUNTER — Other Ambulatory Visit: Payer: Self-pay

## 2020-04-22 ENCOUNTER — Ambulatory Visit (INDEPENDENT_AMBULATORY_CARE_PROVIDER_SITE_OTHER): Payer: Medicare PPO | Admitting: Rehabilitative and Restorative Service Providers"

## 2020-04-22 DIAGNOSIS — M542 Cervicalgia: Secondary | ICD-10-CM | POA: Diagnosis not present

## 2020-04-22 DIAGNOSIS — R293 Abnormal posture: Secondary | ICD-10-CM | POA: Diagnosis not present

## 2020-04-22 NOTE — Therapy (Signed)
[facility] [place], [place], [address] Phone: [phone]   Fax:  [phone]  Physical Therapy Treatment  Patient Details  Name: [name] MRN: [id number] Date of Birth: 04/04/1941 Referring Provider (PT): Dr.Thekkekandam   Encounter Date: 04/22/2020   PT End of Session - 04/22/20 0850    Visit Number 13    Number of Visits 20    Date for PT Re-Evaluation 05/06/20    Authorization Type [employer] medicare    Authorization Time Period 03/11/20-05/06/20    Authorization - Visit Number 13    Authorization - Number of Visits 20    Progress Note Due on Visit 20    PT Start Time 0845    PT Stop Time 0930    PT Time Calculation (min) 45 min    Activity Tolerance Patient tolerated treatment well           Past Medical History:  Diagnosis Date  . Arthritis    "back; left hip; knees" (12/04/2013)  . Asthma   . Chronic bronchitis ([facility])    "usually get it twice/yr" (12/04/2013)  . Diabetes mellitus without complication ([facility])   . Environmental and seasonal allergies   . GERD (gastroesophageal reflux disease)   . High cholesterol   . Hypertension    pcp   [facility]     . Pneumonia    as a child    Past Surgical History:  Procedure Laterality Date  . ABDOMINAL HYSTERECTOMY    . BLADDER SUSPENSION    . COLONOSCOPY    . JOINT REPLACEMENT    . SHOULDER ARTHROSCOPY W/ ROTATOR CUFF REPAIR Right   . 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   Procedure: TOTAL HIP ARTHROPLASTY;  Surgeon: Newt Minion, MD;  Location: [facility];  Service: Orthopedics;  Laterality: Left;  Left Total Hip Arthroplasty  . 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   Procedure: TOTAL KNEE ARTHROPLASTY;  Surgeon: Newt Minion, MD;  Location: [facility];  Service: Orthopedics;  Laterality: Right;  Right Total Knee Arthroplasty    There were no vitals filed for this visit.   Subjective Assessment -  04/22/20 0850    Subjective Feeling much better. No pain this morning. Took her last prednisone yesterday. Has been doing well.    Currently in Pain? No/denies    Pain Score 0-No pain    Pain Location Scapula    Pain Orientation Left    Pain Type Acute pain                             [facility] Adult PT Treatment/Exercise - 04/22/20 0001      Shoulder Exercises: Seated   Other Seated Exercises shoulder rolls x 10      Shoulder Exercises: Sidelying   External Rotation AROM;Strengthening;Left;10 reps;Weights   2 sets    External Rotation Weight (lbs) 2      Shoulder Exercises: Standing   External Rotation Strengthening;Left;12 reps;Theraband    Theraband Level (Shoulder External Rotation) Level 2 (Red)    Internal Rotation Strengthening;Left;10 reps;Theraband    Theraband Level (Shoulder Internal Rotation) Level 2 (Red)    Extension Strengthening;Both;12 reps;Theraband    Theraband Level (Shoulder Extension) Level 3 (Green)    Row Strengthening;Both;12 reps;Theraband    Theraband Level (Shoulder Row) Level 3 (Green)    Other Standing Exercises scap squeeze 10 sec x 5 reps  x 2 sets; L's x 20; W's x 10 with noodle        Shoulder Exercises: Pulleys   Flexion 2 minutes      Shoulder Exercises: ROM/Strengthening   UBE (Upper Arm Bike) L3 1.5 min fwd/1.5 min back       Shoulder Exercises: Stretch   Other Shoulder Stretches low and midlevel doorway stretch x 20 sec x 3 reps each position       Manual Therapy   Manual therapy comments skilled palpation for assessment of response to dry needling and manual work     Soft tissue mobilization pt Rt sidelying manual work through [employer], upper trap, levator and upper thoracic paraspinals.     Myofascial Release posterior shoulder girdle     Scapular Mobilization Lt       Neck Exercises: Stretches   Upper Trapezius Stretch Right;Left;10 seconds;3 reps            Trigger Point Dry Needling - 04/22/20 0001     Consent Given? Yes    Education Handout Provided Previously provided    Dry Needling Comments Lt posterior shoulder girdle     Infraspinatus Response Palpable increased muscle length                     PT Long Term Goals - 04/20/20 0816      PT LONG TERM GOAL #1   Title Independent with HEP for maintaining good posture.    Time 8    Period Weeks    Status Partially Met      PT LONG TERM GOAL #2   Title Pt able to perform ADLS without neck/shoulder pain 90% of the time.    Baseline --    Time 8    Period Weeks    Status Achieved      PT LONG TERM GOAL #3   Title Increase cervical mobility in lateral flexion and rotation by 5-7 degrees    Time 8    Period Weeks    Status Partially Met      PT LONG TERM GOAL #4   Baseline 43% limited, improved from 47% limited.    Period Weeks    Status On-going                 Plan - 04/22/20 0910    Clinical Impression Statement Patient reports improvement in symptoms today following course of prednisone which ended yesterday. She continues to work on exercises at home. Note persistent tightness in the Lt periscapular musculature; RC weakness.    Rehab Potential Good    PT Frequency 3x / week    PT Duration 6 weeks    PT Treatment/Interventions ADLs/Self Care Home Management;Cryotherapy;Electrical Stimulation;Moist Heat;Therapeutic exercise;Therapeutic activities;Neuromuscular re-education;Manual techniques;Patient/family education;Dry needling;Taping;Spinal Manipulations;Joint Manipulations    PT Next Visit Plan continue postural strengthening/ flexibility; progress with strengthening    PT Home Exercise Plan [id number]    Consulted and Agree with Plan of Care Patient           Patient will benefit from skilled therapeutic intervention in order to improve the following deficits and impairments:     Visit Diagnosis: Cervicalgia  Abnormal posture     Problem List Patient Active Problem List   Diagnosis Date  Noted  . DDD (degenerative disc disease), cervical 03/03/2020  . BMI 28.0-28.9,adult 05/01/2019  . Chronic kidney disease (CKD), stage III (moderate) 03/30/2019  . Primary open angle glaucoma (POAG) of left eye, mild stage  11/05/2018  . 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 Presence of orthopedic joint implant 12/03/2013  . 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[facility]) 01/18/2012  . DDD (degenerative disc disease), lumbar 01/05/2012  . Osteopenia 07/18/2010  . Asthma in adult 07/18/2010  . Backache 11/01/2009  . Anemia, unspecified 09/14/2009  . VITAMIN D DEFICIENCY 09/02/2009  . Hyperlipidemia 09/02/2009  . Essential hypertension 09/02/2009  . ALLERGIC RHINITIS 09/02/2009  . REFLUX ESOPHAGITIS 09/02/2009  . URINARY INCONTINENCE, 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    Renaye Janicki Nilda Simmer PT, MPH  04/22/2020, 9:27 AM  [facility] [place], [place], [address] Phone: [phone]   Fax:  [phone]  Name: SCHERRIE SENECA MRN: [id number] Date of Birth: October 30, 1940

## 2020-04-27 ENCOUNTER — Encounter: Payer: Medicare PPO | Admitting: Rehabilitative and Restorative Service Providers"

## 2020-04-29 ENCOUNTER — Ambulatory Visit (INDEPENDENT_AMBULATORY_CARE_PROVIDER_SITE_OTHER): Payer: Medicare PPO | Admitting: Rehabilitative and Restorative Service Providers"

## 2020-04-29 ENCOUNTER — Encounter: Payer: Self-pay | Admitting: Rehabilitative and Restorative Service Providers"

## 2020-04-29 ENCOUNTER — Other Ambulatory Visit: Payer: Self-pay

## 2020-04-29 DIAGNOSIS — M542 Cervicalgia: Secondary | ICD-10-CM | POA: Diagnosis not present

## 2020-04-29 DIAGNOSIS — R293 Abnormal posture: Secondary | ICD-10-CM | POA: Diagnosis not present

## 2020-04-29 NOTE — Therapy (Addendum)
[facility] [place], [place], [address] Phone: [phone]   Fax:  [phone]  Physical Therapy Treatment  Patient Details  Name: RUPA LAGAN MRN: [id number] Date of Birth: Jul 20, 1941 Referring Provider (PT): Dr.Thekkekandam   Encounter Date: 04/29/2020   PT End of Session - 04/29/20 0926    Visit Number 14    Number of Visits 20    Date for PT Re-Evaluation 05/06/20    Authorization Type [employer] medicare    Authorization Time Period 03/11/20-05/06/20    Authorization - Visit Number 14    Authorization - Number of Visits 20    Progress Note Due on Visit 20    PT Start Time 0926    PT Stop Time 1011    PT Time Calculation (min) 45 min    Activity Tolerance Patient tolerated treatment well           Past Medical History:  Diagnosis Date  . Arthritis    "back; left hip; knees" (12/04/2013)  . Asthma   . Chronic bronchitis ([facility])    "usually get it twice/yr" (12/04/2013)  . Diabetes mellitus without complication ([facility])   . Environmental and seasonal allergies   . GERD (gastroesophageal reflux disease)   . High cholesterol   . Hypertension    pcp   [facility]     . Pneumonia    as a child    Past Surgical History:  Procedure Laterality Date  . ABDOMINAL HYSTERECTOMY    . BLADDER SUSPENSION    . COLONOSCOPY    . JOINT REPLACEMENT    . SHOULDER ARTHROSCOPY W/ ROTATOR CUFF REPAIR Right   . 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   Procedure: TOTAL HIP ARTHROPLASTY;  Surgeon: Newt Minion, MD;  Location: [facility];  Service: Orthopedics;  Laterality: Left;  Left Total Hip Arthroplasty  . 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   Procedure: TOTAL KNEE ARTHROPLASTY;  Surgeon: Newt Minion, MD;  Location: [facility];  Service: Orthopedics;  Laterality: Right;  Right Total Knee Arthroplasty    There were no vitals filed for this visit.   Subjective Assessment -  04/29/20 0926    Subjective Shoulder pain comes and goes. Has a dull pain in it this morning.    Currently in Pain? Yes    Pain Score 1     Pain Location Scapula    Pain Orientation Left    Pain Descriptors / Indicators Dull;Aching    Pain Type Acute pain              [facility] PT Assessment - 04/29/20 0001      Assessment   Medical Diagnosis cervical DDD    Referring Provider (PT) Dr.Thekkekandam    Onset Date/Surgical Date 02/26/20    Hand Dominance Right    Next MD Visit PRN    Prior Therapy none for shoulder/neck       AROM   Cervical Flexion 50    Cervical Extension 53    Cervical - Right Side Bend 26    Cervical - Left Side Bend 27    Cervical - Right Rotation 61    Cervical - Left Rotation 59      Palpation   Palpation comment muscular tightness through te Lt supraspinitus; infraspinitus; periscapular musculature; bilat upper trap; leveator; pecs  [facility] Adult PT Treatment/Exercise - 04/29/20 0001      Shoulder Exercises: Sidelying   External Rotation AROM;Strengthening;Left;10 reps;Weights   2 sets    External Rotation Weight (lbs) 2      Shoulder Exercises: Standing   External Rotation Strengthening;Left;Right;10 reps;Theraband    Theraband Level (Shoulder External Rotation) Level 2 (Red)    Internal Rotation Strengthening;Left;Right;10 reps;Theraband    Theraband Level (Shoulder Internal Rotation) Level 2 (Red)    Extension Strengthening;Both;12 reps;Theraband    Theraband Level (Shoulder Extension) Level 3 (Green)    Row Strengthening;Both;12 reps;Theraband    Theraband Level (Shoulder Row) Level 3 (Green)    Other Standing Exercises scap squeeze 10 sec x 5 reps x 2 sets; L's x 20; W's x 10 with noodle        Shoulder Exercises: Pulleys   Flexion 2 minutes      Shoulder Exercises: ROM/Strengthening   UBE (Upper Arm Bike) L3 2 min fwd/2 min back       Shoulder Exercises: Stretch   Other Shoulder Stretches low and  midlevel doorway stretch x 30 sec x 3 reps each position       Moist Heat Therapy   Number Minutes Moist Heat 14 Minutes    Moist Heat Location Shoulder   Lt      Electrical Stimulation   Electrical Stimulation Location Lt posterior shoulder girdle     Electrical Stimulation Action IFC    Electrical Stimulation Parameters to tolerance    Electrical Stimulation Goals Pain;Tone      Manual Therapy   Soft tissue mobilization pt Rt sidelying manual work through [employer], upper trap, levator and upper thoracic paraspinals.     Myofascial Release posterior shoulder girdle     Scapular Mobilization Lt       Neck Exercises: Stretches   Upper Trapezius Stretch Right;Left;10 seconds;3 reps                  PT Education - 04/29/20 1015    Education Details TENS; continued HEP and plan for flare ups of symptoms    Person(s) Educated Patient    Methods Explanation;Demonstration;Tactile cues;Verbal cues;Handout    Comprehension Verbalized understanding;Returned demonstration;Verbal cues required;Tactile cues required               PT Long Term Goals - 04/29/20 9381      PT LONG TERM GOAL #1   Title Independent with HEP for maintaining good posture.    Time 8    Period Weeks    Status Achieved      PT LONG TERM GOAL #2   Title Pt able to perform ADLS without neck/shoulder pain 90% of the time.    Baseline 80% improvement    Time 8    Period Weeks    Status Achieved      PT LONG TERM GOAL #3   Title Increase cervical mobility in lateral flexion and rotation by 5-7 degrees    Time 8    Period Weeks    Status Partially Met      PT LONG TERM GOAL #4   Title Improved FOTO limitations to <= 33%    Baseline -    Time 8    Period Weeks    Status On-going                 Plan - 04/29/20 8299    Clinical Impression Statement Some increased soreness and discomfort in the Lt shoulder blade area  today. Goals of therapy are partially accomplished but patient  continues to have increased pain with increased UE activities and at times the position of the shoulder when sleeping. Patient has persistent tightness in Lt periscapular musculature related to posture and alignment as well as RC weakness. She is working on Hotel manager exercises for home. She has a massage scheduled with her niece tomorrow and will try to find someone to help with a TENS unit to assist with management of pain as she continues to strengthening. Patient will call with any questions or problems.    Rehab Potential Good    PT Frequency 3x / week    PT Duration 6 weeks    PT Treatment/Interventions ADLs/Self Care Home Management;Cryotherapy;Electrical Stimulation;Moist Heat;Therapeutic exercise;Therapeutic activities;Neuromuscular re-education;Manual techniques;Patient/family education;Dry needling;Taping;Spinal Manipulations;Joint Manipulations    PT Next Visit Plan hold PT. Patient will call with any questions or problems.    PT Home Exercise Plan [id number]    Consulted and Agree with Plan of Care Patient           Patient will benefit from skilled therapeutic intervention in order to improve the following deficits and impairments:     Visit Diagnosis: Cervicalgia  Abnormal posture     Problem List Patient Active Problem List   Diagnosis Date Noted  . DDD (degenerative disc disease), cervical 03/03/2020  . BMI 28.0-28.9,adult 05/01/2019  . Chronic kidney disease (CKD), stage III (moderate) 03/30/2019  . Primary open angle glaucoma (POAG) of left eye, mild stage 11/05/2018  . 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 Presence of orthopedic joint implant 12/03/2013  . 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[facility]) 01/18/2012  . DDD (degenerative disc disease), lumbar 01/05/2012  . Osteopenia 07/18/2010  . Asthma in adult 07/18/2010  . Backache 11/01/2009  . Anemia, unspecified  09/14/2009  . VITAMIN D DEFICIENCY 09/02/2009  . Hyperlipidemia 09/02/2009  . Essential hypertension 09/02/2009  . ALLERGIC RHINITIS 09/02/2009  . REFLUX ESOPHAGITIS 09/02/2009  . URINARY INCONTINENCE, 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    Masao Junker Nilda Simmer PT, MPH  04/29/2020, 10:15 AM  [facility], [place], [address] Phone: [phone]   Fax:  [phone]  Name: JOI LEYVA MRN: [id number] Date of Birth: Nov 24, 1940  PHYSICAL THERAPY DISCHARGE SUMMARY  Visits from Start of Care: 14  Current functional level related to goals / functional outcomes: See progress note or discharge status    Remaining deficits: Intermittent pain - significant decrease in intensity    Education / Equipment: HEP  Plan: Patient agrees to discharge.  Patient goals were met. Patient is being discharged due to meeting the stated rehab goals.  ?????     Kengo Sturges P. Helene Kelp PT, MPH 06/30/20 10:44 AM

## 2020-04-29 NOTE — Patient Instructions (Signed)

## 2020-05-11 DIAGNOSIS — R52 Pain, unspecified: Secondary | ICD-10-CM | POA: Diagnosis not present

## 2020-05-11 DIAGNOSIS — M898X1 Other specified disorders of bone, shoulder: Secondary | ICD-10-CM | POA: Diagnosis not present

## 2020-05-11 DIAGNOSIS — M25512 Pain in left shoulder: Secondary | ICD-10-CM | POA: Diagnosis not present

## 2020-05-11 DIAGNOSIS — G8929 Other chronic pain: Secondary | ICD-10-CM | POA: Diagnosis not present

## 2020-05-11 DIAGNOSIS — M5412 Radiculopathy, cervical region: Secondary | ICD-10-CM | POA: Diagnosis not present

## 2020-05-31 DIAGNOSIS — M7918 Myalgia, other site: Secondary | ICD-10-CM | POA: Diagnosis not present

## 2020-05-31 DIAGNOSIS — M5030 Other cervical disc degeneration, unspecified cervical region: Secondary | ICD-10-CM | POA: Diagnosis not present

## 2020-05-31 DIAGNOSIS — M545 Low back pain: Secondary | ICD-10-CM | POA: Diagnosis not present

## 2020-05-31 DIAGNOSIS — M47812 Spondylosis without myelopathy or radiculopathy, cervical region: Secondary | ICD-10-CM | POA: Diagnosis not present

## 2020-06-17 ENCOUNTER — Encounter: Payer: Self-pay | Admitting: Nurse Practitioner

## 2020-06-17 ENCOUNTER — Telehealth (INDEPENDENT_AMBULATORY_CARE_PROVIDER_SITE_OTHER): Payer: Medicare PPO | Admitting: Nurse Practitioner

## 2020-06-17 DIAGNOSIS — J0110 Acute frontal sinusitis, unspecified: Secondary | ICD-10-CM

## 2020-06-17 MED ORDER — AMOXICILLIN-POT CLAVULANATE 875-125 MG PO TABS: 1.0000 | ORAL_TABLET | Freq: Two times a day (BID) | ORAL | 0 refills | Status: DC

## 2020-06-17 MED ORDER — FLUTICASONE PROPIONATE 50 MCG/ACT NA SUSP: 2.0000 | Freq: Every day | NASAL | 3 refills | Status: DC

## 2020-06-17 NOTE — Progress Notes (Addendum)
Virtual Visit via Telephone Note  I connected with  [name] on 06/17/20 at  9:30 AM EDT by telephone and verified that I am speaking with the correct person using two identifiers.   I discussed the limitations, risks, security and privacy concerns of performing an evaluation and management service by telephone and the availability of in person appointments. I also discussed with the patient that there may be a patient responsible charge related to this service. The patient expressed understanding and agreed to proceed.  The patient is: at home I am: in the office  Subjective:    CC: Sinus symptoms   HPI: [name] is an 79 year old female presenting today via telephone visit for concerns with nasal congestion, headache, facial pain and pressure, cough with yellow mucus, sinus congestion, rhinorrhea, postnasal drip, and pain across the top of her nose when she wears her glasses all which started about Sunday morning of this week.  She has been vaccinated against COVID-19.  She denies any contact with sick persons and reports that she has been staying at home mostly and avoiding crowds and large gatherings.  She denies decreased appetite, fever, chills, loss of taste, loss of smell, nausea, vomiting, increased fatigue, weakness, shortness of breath, chest pain, or dizziness.  Past medical history, Surgical history, Family history not pertinant except as noted below, Social history, Allergies, and medications have been entered into the medical record, reviewed, and corrections made.   Review of Systems:  No fevers, chills, night sweats, weight loss, chest pain, or shortness of breath.    Objective:    General: Speaking clearly in complete sentences without any shortness of breath.  Alert and oriented x3.  Normal judgment. No apparent acute distress. She is audibly congested   Impression and Recommendations:    1. Acute non-recurrent frontal sinusitis Symptoms and  presentation consistent with acute nonrecurrent frontal sinusitis. I do feel that it would be beneficial for her to be tested for COVID-19 as the symptoms can often times mimic COVID-19 symptoms and we would most certainly want to rule this out to avoid possible spreading of the virus to others or worsening conditions giving her age and past medical history. Strongly recommended testing for COVID-19. Strongly recommend quarantine for at least 10 days past the onset of symptoms or until negative Covid test result has been received to ensure that you are not spreading the virus to others. Recommend continue Tylenol sinus or Mucinex. Recommend begin use of Flonase. Will provide prescription for Augmentin for 5 days for suspected sinus infection. Did request if the patient is tested for COVID-19 that she inform us if the results are positive so that we can properly assess for need for monoclonal antibody infusion as she would most certainly qualify. Recommend follow-up Friday afternoon if symptoms worsen or persist despite antibiotic treatment.  - amoxicillin-clavulanate (AUGMENTIN) 875-125 MG tablet; Take 1 tablet by mouth 2 (two) times daily.  Dispense: 10 tablet; Refill: 0 - fluticasone (FLONASE) 50 MCG/ACT nasal spray; Place 2 sprays into both nostrils daily.  Dispense: 16 g; Refill: 3      I discussed the assessment and treatment plan with the patient. The patient was provided an opportunity to ask questions and all were answered. The patient agreed with the plan and demonstrated an understanding of the instructions.   The patient was advised to call back or seek an in-person evaluation if the symptoms worsen or if the condition fails to improve as anticipated.  I provided 20  minutes of non-face-to-face time during this TELEPHONE encounter.    Tollie Eth, NP

## 2020-06-17 NOTE — Patient Instructions (Addendum)
I strongly recommend that you get testing for COVID-19 as the symptoms are often times very close to the same symptoms as sinusitis especially in individuals who have been vaccinated.  Please quarantine yourself for at least 10 days past the start of your first symptoms or until you have received a negative Covid result in the event that this is COVID-19 to avoid spreading the virus to others.   I have called in a prescription for Augmentin.  You will take this twice a day with 12 hours between doses for 5 days.  I also recommend Flonase 2 sprays in each nostril every day.  I have sent a prescription in for this as well.  She may continue to use the Tylenol Cold and sinus for your symptoms as well or you can use over-the-counter Mucinex which has been shown to be helpful.  If you continue to have symptoms despite the use of the antibiotics and the over-the-counter treatments please let us know.  If you are tested for COVID-19 please let us know the results of this test so that we can appropriately update your chart.  If you are positive for COVID-19 you would be eligible for a monoclonal antibody infusion treatment given your age and past medical history.  This treatment is performed on an outpatient basis at [facility] and has been shown to significantly reduce symptoms and help prevent hospitalization in older individuals in individuals with certain conditions.  You must be within 10 days of the start of your symptoms to be eligible for this so please be sure to contact us and let us know as soon as possible if your test is positive.

## 2020-06-18 ENCOUNTER — Other Ambulatory Visit: Payer: Self-pay | Admitting: Family Medicine

## 2020-06-18 ENCOUNTER — Other Ambulatory Visit: Payer: Self-pay | Admitting: Physician Assistant

## 2020-06-18 DIAGNOSIS — E1149 Type 2 diabetes mellitus with other diabetic neurological complication: Secondary | ICD-10-CM

## 2020-06-21 ENCOUNTER — Other Ambulatory Visit: Payer: Self-pay | Admitting: Family Medicine

## 2020-06-21 DIAGNOSIS — I10 Essential (primary) hypertension: Secondary | ICD-10-CM

## 2020-06-23 ENCOUNTER — Ambulatory Visit (INDEPENDENT_AMBULATORY_CARE_PROVIDER_SITE_OTHER): Payer: Medicare PPO | Admitting: Family Medicine

## 2020-06-23 VITALS — BP 135/80 | HR 55 | Temp 97.5°F | Wt 168.0 lb

## 2020-06-23 DIAGNOSIS — N1831 Chronic kidney disease, stage 3a: Secondary | ICD-10-CM

## 2020-06-23 DIAGNOSIS — I10 Essential (primary) hypertension: Secondary | ICD-10-CM

## 2020-06-23 DIAGNOSIS — Z23 Encounter for immunization: Secondary | ICD-10-CM | POA: Diagnosis not present

## 2020-06-23 DIAGNOSIS — E1149 Type 2 diabetes mellitus with other diabetic neurological complication: Secondary | ICD-10-CM | POA: Diagnosis not present

## 2020-06-23 LAB — POCT GLYCOSYLATED HEMOGLOBIN (HGB A1C): Hemoglobin A1C: 7.1 % — AB (ref 4.0–5.6)

## 2020-06-23 NOTE — Assessment & Plan Note (Signed)
Pressure medication just refilled earlier this week.  She was in most out.  Due for updated lab work today including CMP and lipids.

## 2020-06-23 NOTE — Progress Notes (Signed)
Established Patient Office Visit  Subjective:  Patient ID: [name], female    DOB: [birth year]  Age: 79 y.o. MRN: [id number]  CC:  Chief Complaint  Patient presents with  . Diabetes    HPI [name] presents for   Diabetes - no hypoglycemic events. No wounds or sores that are not healing well. No increased thirst or urination. Checking glucose at home.  Fasting blood sugars running in the 120s to 130s.  Taking medications as prescribed without any side effects.  Due for foot exam and urine micro albumin today.  Hypertension- Pt denies chest pain, SOB, dizziness, or heart palpitations.  Taking meds as directed w/o problems.  Denies medication side effects.    She was seen for one of my partners about a week ago for sinusitis.  She says she is feeling much better still little bit of drainage but overall improving.  She did complete her antibiotics.  Chronic renal disease-no recent changes or concerns.  Were following her renal function every 6 months.   Past Medical History:  Diagnosis Date  . Arthritis    "back; left hip; knees" (12/04/2013)  . Asthma   . Chronic bronchitis ([place])    "usually get it twice/yr" (12/04/2013)  . Diabetes mellitus without complication ([place])   . Environmental and seasonal allergies   . GERD (gastroesophageal reflux disease)   . High cholesterol   . Hypertension    pcp   [facility]     . Pneumonia    as a child    Past Surgical History:  Procedure Laterality Date  . ABDOMINAL HYSTERECTOMY    . BLADDER SUSPENSION    . COLONOSCOPY    . JOINT REPLACEMENT    . SHOULDER ARTHROSCOPY W/ ROTATOR CUFF REPAIR Right   . 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   Procedure: TOTAL HIP ARTHROPLASTY;  Surgeon: Newt Minion, MD;  Location: [facility];  Service: Orthopedics;  Laterality: Left;  Left Total Hip Arthroplasty  . 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   Procedure: TOTAL KNEE ARTHROPLASTY;   Surgeon: Newt Minion, MD;  Location: [place];  Service: Orthopedics;  Laterality: Right;  Right Total Knee Arthroplasty    Family History  Problem Relation Age of Onset  . Hypertension Mother   . Heart disease Mother   . Transient ischemic attack Mother   . CAD Father   . Cancer - Ovarian Sister   . Diabetes Daughter     Social History   Socioeconomic History  . Marital status: Widowed    Spouse name: Not on file  . Number of children: Not on file  . Years of education: Not on file  . Highest education level: Not on file  Occupational History  . Not on file  Tobacco Use  . Smoking status: Never Smoker  . Smokeless tobacco: Never Used  Substance and Sexual Activity  . Alcohol use: No  . Drug use: No  . Sexual activity: Yes  Other Topics Concern  . Not on file  Social History Narrative   Retired. Widowed. Husband died 02-08-2019   Social Determinants of Health   Financial Resource Strain:   . Difficulty of Paying Living Expenses: Not on file  Food Insecurity:   . Worried About Charity fundraiser in the Last Year: Not on file  . Ran Out of Food in the Last Year: Not on file  Transportation Needs:   .  Lack of Transportation (Medical): Not on file  . Lack of Transportation (Non-Medical): Not on file  Physical Activity:   . Days of Exercise per Week: Not on file  . Minutes of Exercise per Session: Not on file  Stress:   . Feeling of Stress : Not on file  Social Connections:   . Frequency of Communication with Friends and Family: Not on file  . Frequency of Social Gatherings with Friends and Family: Not on file  . Attends Religious Services: Not on file  . Active Member of Clubs or Organizations: Not on file  . Attends Archivist Meetings: Not on file  . Marital Status: Not on file  Intimate Partner Violence:   . Fear of Current or Ex-Partner: Not on file  . Emotionally Abused: Not on file  . Physically Abused: Not on file  . Sexually Abused: Not on  file    Outpatient Medications Prior to Visit  Medication Sig Dispense Refill  . Accu-Chek FastClix Lancets MISC For checking blood sugars up to 3 times daily. E11.49 306 each 4  . ACCU-CHEK GUIDE test strip Test twice daily.  DX: E11.49 200 each 6  . aspirin 81 MG chewable tablet Chew 1 tablet by mouth daily.    . blood glucose meter kit and supplies Dispense based on patient and insurance preference. For testing up to twice daily. E11.49 1 each 0  . carvedilol (COREG) 6.25 MG tablet TAKE 1 TABLET(6.25 MG) BY MOUTH TWICE DAILY WITH A MEAL 180 tablet 1  . cetirizine (ZYRTEC) 10 MG tablet Take 10 mg by mouth daily.    . clobetasol ointment (TEMOVATE) 8.67 % Apply 1 application topically 2 (two) times daily.    . dorzolamide (TRUSOPT) 2 % ophthalmic solution Place 1 drop into both eyes 2 (two) times daily.    . fluticasone (FLONASE) 50 MCG/ACT nasal spray Place 2 sprays into both nostrils daily. 16 g 3  . gabapentin (NEURONTIN) 300 MG capsule TAKE 1 CAPSULE(300 MG) BY MOUTH TWICE DAILY 180 capsule 1  . hydrochlorothiazide (HYDRODIURIL) 25 MG tablet TAKE 1 TABLET(25 MG) BY MOUTH DAILY 90 tablet 1  . ipratropium-albuterol (DUONEB) 0.5-2.5 (3) MG/3ML SOLN Take 3 mLs by nebulization every 6 (six) hours as needed. 120 mL 1  . metFORMIN (GLUCOPHAGE) 1000 MG tablet TAKE 1 TABLET(1000 MG) BY MOUTH TWICE DAILY WITH A MEAL 180 tablet 1  . mineral oil-hydrophilic petrolatum (AQUAPHOR) ointment Apply topically as needed for dry skin. Apply to hands and arms 4 times a day 420 g 0  . simvastatin (ZOCOR) 80 MG tablet Take 1 tablet (80 mg total) by mouth at bedtime. 90 tablet 3  . sitaGLIPtin (JANUVIA) 25 MG tablet Take 1 tablet (25 mg total) by mouth daily. 30 tablet 2  . triamcinolone ointment (KENALOG) 0.5 % Apply 1 application topically 2 (two) times daily as needed. For rash and itching. 30 g 1  . amoxicillin-clavulanate (AUGMENTIN) 875-125 MG tablet Take 1 tablet by mouth 2 (two) times daily. 10 tablet 0    No facility-administered medications prior to visit.    Allergies  Allergen Reactions  . Losartan Swelling    Angioedema  . Nsaids Swelling    IBU - tongue swlling.   . Levofloxacin Swelling  . Oxycodone Hcl Swelling    Lip started to swell up  . Verapamil Swelling  . Biaxin [Clarithromycin] Other (See Comments)    Elevated heart rate  . Oxycontin [Oxycodone]     Lips swell  .  Vicodin [Hydrocodone-Acetaminophen]     swelling    ROS Review of Systems    Objective:    Physical Exam Constitutional:      Appearance: She is well-developed.  HENT:     Head: Normocephalic and atraumatic.  Cardiovascular:     Rate and Rhythm: Normal rate and regular rhythm.     Heart sounds: Normal heart sounds.  Pulmonary:     Effort: Pulmonary effort is normal.     Breath sounds: Normal breath sounds.  Skin:    General: Skin is warm and dry.  Neurological:     Mental Status: She is alert and oriented to person, place, and time.  Psychiatric:        Behavior: Behavior normal.     BP 135/80 (BP Location: Left Arm, Patient Position: Sitting, Cuff Size: Normal)   Pulse (!) 55   Temp (!) 97.5 F (36.4 C) (Oral)   Wt 168 lb 0.6 oz (76.2 kg)   BMI 27.96 kg/m  Wt Readings from Last 3 Encounters:  06/23/20 168 lb 0.6 oz (76.2 kg)  04/17/20 170 lb (77.1 kg)  03/23/20 167 lb (75.8 kg)     Health Maintenance Due  Topic Date Due  . FOOT EXAM  06/18/2020  . URINE MICROALBUMIN  06/18/2020    There are no preventive care reminders to display for this patient.  No results found for: TSH Lab Results  Component Value Date   WBC 11.5 (H) 06/23/2020   HGB 12.0 06/23/2020   HCT 36.3 06/23/2020   MCV 85.6 06/23/2020   PLT 261 06/23/2020   Lab Results  Component Value Date   NA 138 06/23/2020   K 4.5 06/23/2020   CO2 28 06/23/2020   GLUCOSE 128 (H) 06/23/2020   BUN 20 06/23/2020   CREATININE 0.97 (H) 06/23/2020   BILITOT 0.3 06/23/2020   ALKPHOS 83 03/27/2019   AST 11  06/23/2020   ALT 7 06/23/2020   PROT 7.2 06/23/2020   ALBUMIN 3.7 09/25/2013   CALCIUM 9.9 06/23/2020   Lab Results  Component Value Date   CHOL 130 06/23/2020   Lab Results  Component Value Date   HDL 28 (L) 06/23/2020   Lab Results  Component Value Date   LDLCALC 69 06/23/2020   Lab Results  Component Value Date   TRIG 239 (H) 06/23/2020   Lab Results  Component Value Date   CHOLHDL 4.6 06/23/2020   Lab Results  Component Value Date   HGBA1C 7.1 (A) 06/23/2020      Assessment & Plan:   Problem List Items Addressed This Visit      Cardiovascular and Mediastinum   Essential hypertension    Pressure medication just refilled earlier this week.  She was in most out.  Due for updated lab work today including CMP and lipids.      Relevant Orders   CBC (Completed)   COMPLETE METABOLIC PANEL WITH GFR (Completed)   Lipid panel (Completed)     Endocrine   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) - Primary    A1c improved slightly today now down to 7.1, from 7.4.  Has tried really hard to cut back on sweets and really congratulated her on the effort.  Just encouraged her to make sure that she staying active as well.      Relevant Orders   CBC (Completed)   COMPLETE METABOLIC PANEL WITH GFR (Completed)   Lipid panel (Completed)   POCT HgB A1C (Completed)  Genitourinary   Chronic kidney disease (CKD), stage III (moderate) (HCC)   Relevant Orders   CBC (Completed)   COMPLETE METABOLIC PANEL WITH GFR (Completed)   Lipid panel (Completed)    Other Visit Diagnoses    Need for influenza vaccination       Relevant Orders   Flu Vaccine QUAD High Dose(Fluad) (Completed)     Acute sinusitis - Improving!!!   No orders of the defined types were placed in this encounter.   Follow-up: Return in about 3 months (around 09/23/2020) for Diabetes follow-up.    Beatrice Lecher, MD

## 2020-06-23 NOTE — Assessment & Plan Note (Addendum)
A1c improved slightly today now down to 7.1, from 7.4.  Has tried really hard to cut back on sweets and really congratulated her on the effort.  Just encouraged her to make sure that she staying active as well.

## 2020-06-24 ENCOUNTER — Encounter: Payer: Self-pay | Admitting: Family Medicine

## 2020-06-24 LAB — COMPLETE METABOLIC PANEL WITH GFR
AG Ratio: 1.5 (calc) (ref 1.0–2.5)
ALT: 7 U/L (ref 6–29)
AST: 11 U/L (ref 10–35)
Albumin: 4.3 g/dL (ref 3.6–5.1)
Alkaline phosphatase (APISO): 85 U/L (ref 37–153)
BUN/Creatinine Ratio: 21 (calc) (ref 6–22)
BUN: 20 mg/dL (ref 7–25)
CO2: 28 mmol/L (ref 20–32)
Calcium: 9.9 mg/dL (ref 8.6–10.4)
Chloride: 101 mmol/L (ref 98–110)
Creat: 0.97 mg/dL — ABNORMAL HIGH (ref 0.60–0.88)
GFR, Est African American: 63 mL/min/{1.73_m2} (ref >=60)
GFR, Est Non African American: 55 mL/min/{1.73_m2} — ABNORMAL LOW (ref >=60)
Globulin: 2.9 g/dL (calc) (ref 1.9–3.7)
Glucose, Bld: 128 mg/dL — ABNORMAL HIGH (ref 65–99)
Potassium: 4.5 mmol/L (ref 3.5–5.3)
Sodium: 138 mmol/L (ref 135–146)
Total Bilirubin: 0.3 mg/dL (ref 0.2–1.2)
Total Protein: 7.2 g/dL (ref 6.1–8.1)

## 2020-06-24 LAB — CBC
HCT: 36.3 % (ref 35.0–45.0)
Hemoglobin: 12 g/dL (ref 11.7–15.5)
MCH: 28.3 pg (ref 27.0–33.0)
MCHC: 33.1 g/dL (ref 32.0–36.0)
MCV: 85.6 fL (ref 80.0–100.0)
MPV: 11.1 fL (ref 7.5–12.5)
Platelets: 261 10*3/uL (ref 140–400)
RBC: 4.24 10*6/uL (ref 3.80–5.10)
RDW: 14.2 % (ref 11.0–15.0)
WBC: 11.5 10*3/uL — ABNORMAL HIGH (ref 3.8–10.8)

## 2020-06-24 LAB — LIPID PANEL
Cholesterol: 130 mg/dL (ref <200)
HDL: 28 mg/dL — ABNORMAL LOW (ref >=50)
LDL Cholesterol (Calc): 69 mg/dL (calc)
Non-HDL Cholesterol (Calc): 102 mg/dL (calc) (ref <130)
Total CHOL/HDL Ratio: 4.6 (calc) (ref <5.0)
Triglycerides: 239 mg/dL — ABNORMAL HIGH (ref <150)

## 2020-07-12 DIAGNOSIS — H524 Presbyopia: Secondary | ICD-10-CM | POA: Diagnosis not present

## 2020-07-12 DIAGNOSIS — E119 Type 2 diabetes mellitus without complications: Secondary | ICD-10-CM | POA: Diagnosis not present

## 2020-07-12 DIAGNOSIS — H401133 Primary open-angle glaucoma, bilateral, severe stage: Secondary | ICD-10-CM | POA: Diagnosis not present

## 2020-07-12 DIAGNOSIS — H5203 Hypermetropia, bilateral: Secondary | ICD-10-CM | POA: Diagnosis not present

## 2020-07-12 LAB — HM DIABETES EYE EXAM

## 2020-07-27 DIAGNOSIS — M5412 Radiculopathy, cervical region: Secondary | ICD-10-CM | POA: Diagnosis not present

## 2020-07-29 ENCOUNTER — Other Ambulatory Visit: Payer: Self-pay | Admitting: Family Medicine

## 2020-09-02 DIAGNOSIS — M47812 Spondylosis without myelopathy or radiculopathy, cervical region: Secondary | ICD-10-CM | POA: Diagnosis not present

## 2020-09-02 DIAGNOSIS — M542 Cervicalgia: Secondary | ICD-10-CM | POA: Diagnosis not present

## 2020-09-23 ENCOUNTER — Other Ambulatory Visit: Payer: Self-pay

## 2020-09-23 ENCOUNTER — Encounter: Payer: Self-pay | Admitting: Family Medicine

## 2020-09-23 ENCOUNTER — Ambulatory Visit (INDEPENDENT_AMBULATORY_CARE_PROVIDER_SITE_OTHER): Payer: Medicare PPO | Admitting: Family Medicine

## 2020-09-23 VITALS — BP 126/62 | HR 57 | Ht 65.0 in | Wt 166.0 lb

## 2020-09-23 DIAGNOSIS — E1149 Type 2 diabetes mellitus with other diabetic neurological complication: Secondary | ICD-10-CM

## 2020-09-23 DIAGNOSIS — M5382 Other specified dorsopathies, cervical region: Secondary | ICD-10-CM | POA: Diagnosis not present

## 2020-09-23 DIAGNOSIS — M47812 Spondylosis without myelopathy or radiculopathy, cervical region: Secondary | ICD-10-CM | POA: Diagnosis not present

## 2020-09-23 DIAGNOSIS — M436 Torticollis: Secondary | ICD-10-CM | POA: Diagnosis not present

## 2020-09-23 DIAGNOSIS — M542 Cervicalgia: Secondary | ICD-10-CM | POA: Diagnosis not present

## 2020-09-23 DIAGNOSIS — M79674 Pain in right toe(s): Secondary | ICD-10-CM

## 2020-09-23 DIAGNOSIS — I10 Essential (primary) hypertension: Secondary | ICD-10-CM | POA: Diagnosis not present

## 2020-09-23 DIAGNOSIS — M25611 Stiffness of right shoulder, not elsewhere classified: Secondary | ICD-10-CM | POA: Diagnosis not present

## 2020-09-23 DIAGNOSIS — M25612 Stiffness of left shoulder, not elsewhere classified: Secondary | ICD-10-CM | POA: Diagnosis not present

## 2020-09-23 LAB — POCT UA - MICROALBUMIN
Albumin/Creatinine Ratio, Urine, POC: <30
Creatinine, POC: 100 mg/dL
Microalbumin Ur, POC: 30 mg/L

## 2020-09-23 LAB — POCT GLYCOSYLATED HEMOGLOBIN (HGB A1C): Hemoglobin A1C: 7.1 % — AB (ref 4.0–5.6)

## 2020-09-23 NOTE — Progress Notes (Signed)
Established Patient Office Visit  Subjective:  Patient ID: [name], female    DOB: May 19, 1941  Age: 80 y.o. MRN: [id number]  CC:  Chief Complaint  Patient presents with  . Diabetes    HPI [name] presents for   Hypertension- Pt denies chest pain, SOB, dizziness, or heart palpitations.  Taking meds as directed w/o problems.  Denies medication side effects.    Diabetes - no hypoglycemic events. No wounds or sores that are not healing well. No increased thirst or urination. Checking glucose at home. Taking medications as prescribed without any side effects.  Would like referral to podiatry. Reports eye exam is UTD. Done [facility].    also complains of pain at the base of the right great toe.  She says it feels sore sometimes especially in the evenings.  Past Medical History:  Diagnosis Date  . Arthritis    "back; left hip; knees" (12/04/2013)  . Asthma   . Chronic bronchitis ([place])    "usually get it twice/yr" (12/04/2013)  . Diabetes mellitus without complication ([place])   . Environmental and seasonal allergies   . GERD (gastroesophageal reflux disease)   . High cholesterol   . Hypertension    pcp   [facility]     . Pneumonia    as a child    Past Surgical History:  Procedure Laterality Date  . ABDOMINAL HYSTERECTOMY    . BLADDER SUSPENSION    . COLONOSCOPY    . JOINT REPLACEMENT    . SHOULDER ARTHROSCOPY W/ ROTATOR CUFF REPAIR Right   . 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   Procedure: TOTAL HIP ARTHROPLASTY;  Surgeon: Newt Minion, MD;  Location: [place];  Service: Orthopedics;  Laterality: Left;  Left Total Hip Arthroplasty  . 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   Procedure: TOTAL KNEE ARTHROPLASTY;  Surgeon: Newt Minion, MD;  Location: [place];  Service: Orthopedics;  Laterality: Right;  Right Total Knee Arthroplasty    Family History  Problem Relation Age of Onset  . Hypertension Mother   .  Heart disease Mother   . Transient ischemic attack Mother   . CAD Father   . Cancer - Ovarian Sister   . Diabetes Daughter     Social History   Socioeconomic History  . Marital status: Widowed    Spouse name: Not on file  . Number of children: Not on file  . Years of education: Not on file  . Highest education level: Not on file  Occupational History  . Not on file  Tobacco Use  . Smoking status: Never Smoker  . Smokeless tobacco: Never Used  Substance and Sexual Activity  . Alcohol use: No  . Drug use: No  . Sexual activity: Yes  Other Topics Concern  . Not on file  Social History Narrative   Retired. Widowed. Husband died Jan 17, 2019   Social Determinants of Health   Financial Resource Strain: Not on file  Food Insecurity: Not on file  Transportation Needs: Not on file  Physical Activity: Not on file  Stress: Not on file  Social Connections: Not on file  Intimate Partner Violence: Not on file    Outpatient Medications Prior to Visit  Medication Sig Dispense Refill  . Accu-Chek FastClix Lancets MISC For checking blood sugars up to 3 times daily. E11.49 306 each 4  . ACCU-CHEK GUIDE test strip Test twice daily.  DX: E11.49  200 each 6  . aspirin 81 MG chewable tablet Chew 1 tablet by mouth daily.    . blood glucose meter kit and supplies Dispense based on patient and insurance preference. For testing up to twice daily. E11.49 1 each 0  . carvedilol (COREG) 6.25 MG tablet TAKE 1 TABLET(6.25 MG) BY MOUTH TWICE DAILY WITH A MEAL 180 tablet 1  . cetirizine (ZYRTEC) 10 MG tablet Take 10 mg by mouth daily.    . clobetasol ointment (TEMOVATE) 0.76 % Apply 1 application topically 2 (two) times daily.    . dorzolamide (TRUSOPT) 2 % ophthalmic solution Place 1 drop into both eyes 2 (two) times daily.    . fluticasone (FLONASE) 50 MCG/ACT nasal spray Place 2 sprays into both nostrils daily. 16 g 3  . gabapentin (NEURONTIN) 300 MG capsule TAKE 1 CAPSULE(300 MG) BY MOUTH TWICE  DAILY 180 capsule 1  . hydrochlorothiazide (HYDRODIURIL) 25 MG tablet TAKE 1 TABLET(25 MG) BY MOUTH DAILY 90 tablet 1  . ipratropium-albuterol (DUONEB) 0.5-2.5 (3) MG/3ML SOLN Take 3 mLs by nebulization every 6 (six) hours as needed. 120 mL 1  . metFORMIN (GLUCOPHAGE) 1000 MG tablet TAKE 1 TABLET(1000 MG) BY MOUTH TWICE DAILY WITH A MEAL 180 tablet 1  . mineral oil-hydrophilic petrolatum (AQUAPHOR) ointment Apply topically as needed for dry skin. Apply to hands and arms 4 times a day 420 g 0  . simvastatin (ZOCOR) 80 MG tablet Take 1 tablet (80 mg total) by mouth at bedtime. 90 tablet 3  . sitaGLIPtin (JANUVIA) 25 MG tablet Take 1 tablet (25 mg total) by mouth daily. 30 tablet 2  . triamcinolone ointment (KENALOG) 0.5 % Apply 1 application topically 2 (two) times daily as needed. For rash and itching. 30 g 1   No facility-administered medications prior to visit.    Allergies  Allergen Reactions  . Losartan Swelling    Angioedema  . Nsaids Swelling    IBU - tongue swlling.   . Levofloxacin Swelling  . Oxycodone Hcl Swelling    Lip started to swell up  . Verapamil Swelling  . Biaxin [Clarithromycin] Other (See Comments)    Elevated heart rate  . Oxycontin [Oxycodone]     Lips swell  . Vicodin [Hydrocodone-Acetaminophen]     swelling    ROS Review of Systems    Objective:    Physical Exam Constitutional:      Appearance: She is well-developed and well-nourished.  HENT:     Head: Normocephalic and atraumatic.  Cardiovascular:     Rate and Rhythm: Normal rate and regular rhythm.     Heart sounds: Normal heart sounds.  Pulmonary:     Effort: Pulmonary effort is normal.     Breath sounds: Normal breath sounds.  Skin:    General: Skin is warm and dry.  Neurological:     Mental Status: She is alert and oriented to person, place, and time.  Psychiatric:        Mood and Affect: Mood and affect normal.        Behavior: Behavior normal.     BP 126/62   Pulse (!) 57   Ht  '5\' 5"'  (1.651 m)   Wt 166 lb (75.3 kg)   SpO2 100%   BMI 27.62 kg/m  Wt Readings from Last 3 Encounters:  09/23/20 166 lb (75.3 kg)  06/23/20 168 lb 0.6 oz (76.2 kg)  04/17/20 170 lb (77.1 kg)     Health Maintenance Due  Topic Date Due  .  OPHTHALMOLOGY EXAM  Never done  . COVID-19 Vaccine (3 - Booster for Pfizer series) 05/02/2020    There are no preventive care reminders to display for this patient.  No results found for: TSH Lab Results  Component Value Date   WBC 11.5 (H) 06/23/2020   HGB 12.0 06/23/2020   HCT 36.3 06/23/2020   MCV 85.6 06/23/2020   PLT 261 06/23/2020   Lab Results  Component Value Date   NA 138 06/23/2020   K 4.5 06/23/2020   CO2 28 06/23/2020   GLUCOSE 128 (H) 06/23/2020   BUN 20 06/23/2020   CREATININE 0.97 (H) 06/23/2020   BILITOT 0.3 06/23/2020   ALKPHOS 83 03/27/2019   AST 11 06/23/2020   ALT 7 06/23/2020   PROT 7.2 06/23/2020   ALBUMIN 3.7 09/25/2013   CALCIUM 9.9 06/23/2020   Lab Results  Component Value Date   CHOL 130 06/23/2020   Lab Results  Component Value Date   HDL 28 (L) 06/23/2020   Lab Results  Component Value Date   LDLCALC 69 06/23/2020   Lab Results  Component Value Date   TRIG 239 (H) 06/23/2020   Lab Results  Component Value Date   CHOLHDL 4.6 06/23/2020   Lab Results  Component Value Date   HGBA1C 7.1 (A) 09/23/2020      Assessment & Plan:   Problem List Items Addressed This Visit      Cardiovascular and Mediastinum   Essential hypertension - Primary    Well controlled. Continue current regimen. 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 Continue current regimen. Follow up in  3 mo      Relevant Orders   POCT glycosylated hemoglobin (Hb A1C) (Completed)   POCT UA - Microalbumin (Completed)   Ambulatory referral to Podiatry    Other Visit Diagnoses    Great toe pain, right       Relevant Orders    Ambulatory referral to Podiatry     Pain at the base of the great toe.  Suspect osteoarthritis.  We discussed the importance of wearing good supporting shoes during the day and not going barefoot around the house.  Today she does have some kids sneakers on that really do not have a lot of support.  Explained how going barefoot and not wearing good support can put extra pressure at the end of the foot.  She would also like to see podiatry so referral was placed today.  No orders of the defined types were placed in this encounter.   Follow-up: Return in about 3 months (around 12/22/2020) for Diabetes follow-up.    Beatrice Lecher, MD

## 2020-09-23 NOTE — Assessment & Plan Note (Signed)
Well controlled. Continue current regimen. Follow up in  6 mo  

## 2020-09-23 NOTE — Assessment & Plan Note (Signed)
Well controlled. Continue current regimen. Follow up in  3 mo .  

## 2020-09-30 DIAGNOSIS — M542 Cervicalgia: Secondary | ICD-10-CM | POA: Diagnosis not present

## 2020-09-30 DIAGNOSIS — M25611 Stiffness of right shoulder, not elsewhere classified: Secondary | ICD-10-CM | POA: Diagnosis not present

## 2020-09-30 DIAGNOSIS — M436 Torticollis: Secondary | ICD-10-CM | POA: Diagnosis not present

## 2020-09-30 DIAGNOSIS — M5382 Other specified dorsopathies, cervical region: Secondary | ICD-10-CM | POA: Diagnosis not present

## 2020-09-30 DIAGNOSIS — M25612 Stiffness of left shoulder, not elsewhere classified: Secondary | ICD-10-CM | POA: Diagnosis not present

## 2020-09-30 DIAGNOSIS — M47812 Spondylosis without myelopathy or radiculopathy, cervical region: Secondary | ICD-10-CM | POA: Diagnosis not present

## 2020-10-08 ENCOUNTER — Ambulatory Visit: Payer: Self-pay | Admitting: Podiatry

## 2020-10-12 DIAGNOSIS — M25611 Stiffness of right shoulder, not elsewhere classified: Secondary | ICD-10-CM | POA: Diagnosis not present

## 2020-10-12 DIAGNOSIS — M5382 Other specified dorsopathies, cervical region: Secondary | ICD-10-CM | POA: Diagnosis not present

## 2020-10-12 DIAGNOSIS — M47812 Spondylosis without myelopathy or radiculopathy, cervical region: Secondary | ICD-10-CM | POA: Diagnosis not present

## 2020-10-12 DIAGNOSIS — M436 Torticollis: Secondary | ICD-10-CM | POA: Diagnosis not present

## 2020-10-12 DIAGNOSIS — M542 Cervicalgia: Secondary | ICD-10-CM | POA: Diagnosis not present

## 2020-10-12 DIAGNOSIS — M25612 Stiffness of left shoulder, not elsewhere classified: Secondary | ICD-10-CM | POA: Diagnosis not present

## 2020-10-18 DIAGNOSIS — J302 Other seasonal allergic rhinitis: Secondary | ICD-10-CM | POA: Diagnosis not present

## 2020-10-18 DIAGNOSIS — T783XXD Angioneurotic edema, subsequent encounter: Secondary | ICD-10-CM | POA: Diagnosis not present

## 2020-10-18 DIAGNOSIS — J4530 Mild persistent asthma, uncomplicated: Secondary | ICD-10-CM | POA: Diagnosis not present

## 2020-10-22 ENCOUNTER — Emergency Department
Admission: EM | Admit: 2020-10-22 | Discharge: 2020-10-22 | Disposition: A | Discharge status: Home / Self Care | Payer: Medicare PPO | Source: Home / Self Care

## 2020-10-22 ENCOUNTER — Other Ambulatory Visit: Payer: Self-pay

## 2020-10-22 DIAGNOSIS — J0100 Acute maxillary sinusitis, unspecified: Secondary | ICD-10-CM | POA: Diagnosis not present

## 2020-10-22 MED ORDER — AMOXICILLIN-POT CLAVULANATE 875-125 MG PO TABS: 1.0000 | ORAL_TABLET | Freq: Two times a day (BID) | ORAL | 0 refills | Status: DC

## 2020-10-22 NOTE — ED Triage Notes (Signed)
Pt c/o sinus issues since Sunday. Denies fever. No known covid exposure. Has had covid vaccines. Saw her allergy doc on Monday for a med refill. Also rx'd prednisone which she has finished with little relief. Also taking tylenol and flonase prn.

## 2020-10-22 NOTE — Discharge Instructions (Signed)
°  Please take antibiotics as prescribed and be sure to complete entire course even if you start to feel better to ensure infection does not come back.  Follow up with primary care next week if not improving.

## 2020-10-22 NOTE — ED Provider Notes (Signed)
[name] CARE    CSN: [id number] Arrival date & time: 10/22/20  0901      History   Chief Complaint Chief Complaint  Patient presents with  . Sinus issues  . Facial Pain    HPI [name] is a 80 y.o. female.   HPI  [name] is a 80 y.o. female presenting to UC with c/o sinus congestion and Right side sinus pain/pressure since this weekend. She saw her allergist on Monday, who prescribed prednisone. She was advised to f/u if not improving but PCP and allergist are not in office today. Pt reports mild cough and post-nasal drainage. Pt wants to make sure it "doesn't go into chest" denies fever, chills, n/v/d. No chest pain or SOB. No ear pain or dizziness. She has had 3 doses of COVID vaccine, she believes booster was within the last 2-3 months. No known sick contacts.    Past Medical History:  Diagnosis Date  . Arthritis    "back; left hip; knees" (12/04/2013)  . Asthma   . Chronic bronchitis ([place])    "usually get it twice/yr" (12/04/2013)  . Diabetes mellitus without complication ([place])   . Environmental and seasonal allergies   . GERD (gastroesophageal reflux disease)   . High cholesterol   . Hypertension    pcp   [facility]     . Pneumonia    as a child    Patient Active Problem List   Diagnosis Date Noted  . DDD (degenerative disc disease), cervical 03/03/2020  . BMI 28.0-28.9,adult 05/01/2019  . Chronic kidney disease (CKD), stage III (moderate) ([place]) 03/30/2019  . Primary open angle glaucoma (POAG) of left eye, mild stage 11/05/2018  . 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 Presence of orthopedic joint implant 12/03/2013  . 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 DDD (degenerative disc disease), lumbar 01/05/2012  . Osteopenia 07/18/2010  . Asthma in adult 07/18/2010  . Backache 11/01/2009  . Anemia, unspecified 09/14/2009  .  VITAMIN D DEFICIENCY 09/02/2009  . Hyperlipidemia 09/02/2009  . Essential hypertension 09/02/2009  . ALLERGIC RHINITIS 09/02/2009  . REFLUX ESOPHAGITIS 09/02/2009  . 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 ABDOMINAL HYSTERECTOMY    . BLADDER SUSPENSION    . COLONOSCOPY    . JOINT REPLACEMENT    . SHOULDER ARTHROSCOPY W/ ROTATOR CUFF REPAIR Right   . 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   Procedure: TOTAL HIP ARTHROPLASTY;  Surgeon: Newt Minion, MD;  Location: [facility];  Service: Orthopedics;  Laterality: Left;  Left Total Hip Arthroplasty  . 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   Procedure: TOTAL KNEE ARTHROPLASTY;  Surgeon: Newt Minion, MD;  Location: [facility];  Service: Orthopedics;  Laterality: Right;  Right Total Knee Arthroplasty    OB History   No obstetric history on file.      Home Medications    Prior to Admission medications   Medication Sig Start Date End Date Taking? Authorizing Provider  amoxicillin-clavulanate (AUGMENTIN) 875-125 MG tablet Take 1 tablet by mouth 2 (two) times daily. One po bid x 7 days 10/22/20  Yes Gerard Bonus, Bronwen Betters, PA-C  Accu-Chek FastClix Lancets MISC For checking blood sugars up to 3 times daily. E11.49 05/06/19   Hali Marry, MD  ACCU-CHEK GUIDE test strip Test twice  daily.  DX: E11.49 05/28/19   Hali Marry, MD  aspirin 81 MG chewable tablet Chew 1 tablet by mouth daily. 07/03/18   [provider]  blood glucose meter kit and supplies Dispense based on patient and insurance preference. For testing up to twice daily. E11.49 05/01/19   Hali Marry, MD  carvedilol (COREG) 6.25 MG tablet TAKE 1 TABLET(6.25 MG) BY MOUTH TWICE DAILY WITH A MEAL 06/21/20   Hali Marry, MD  cetirizine (ZYRTEC) 10 MG tablet Take 10 mg by mouth daily.    [provider]  clobetasol ointment (TEMOVATE) 4.85 % Apply 1  application topically 2 (two) times daily. 04/14/20   [provider]  dorzolamide (TRUSOPT) 2 % ophthalmic solution Place 1 drop into both eyes 2 (two) times daily. 03/10/19   [provider]  fluticasone (FLONASE) 50 MCG/ACT nasal spray Place 2 sprays into both nostrils daily. 06/17/20   Orma Render, NP  gabapentin (NEURONTIN) 300 MG capsule TAKE 1 CAPSULE(300 MG) BY MOUTH TWICE DAILY 06/18/20   Hali Marry, MD  hydrochlorothiazide (HYDRODIURIL) 25 MG tablet TAKE 1 TABLET(25 MG) BY MOUTH DAILY 07/29/20   Hali Marry, MD  ipratropium-albuterol (DUONEB) 0.5-2.5 (3) MG/3ML SOLN Take 3 mLs by nebulization every 6 (six) hours as needed. 12/17/19   Hali Marry, MD  metFORMIN (GLUCOPHAGE) 1000 MG tablet TAKE 1 TABLET(1000 MG) BY MOUTH TWICE DAILY WITH A MEAL 06/18/20   Hali Marry, MD  mineral oil-hydrophilic petrolatum (AQUAPHOR) ointment Apply topically as needed for dry skin. Apply to hands and arms 4 times a day 08/21/19   Fransico Meadow, PA-C  simvastatin (ZOCOR) 80 MG tablet Take 1 tablet (80 mg total) by mouth at bedtime. 03/23/20   Hali Marry, MD  sitaGLIPtin (JANUVIA) 25 MG tablet Take 1 tablet (25 mg total) by mouth daily. 12/22/19   Hali Marry, MD  triamcinolone ointment (KENALOG) 0.5 % Apply 1 application topically 2 (two) times daily as needed. For rash and itching. 01/27/20   Hali Marry, MD    Family History Family History  Problem Relation Age of Onset  . Hypertension Mother   . Heart disease Mother   . Transient ischemic attack Mother   . CAD Father   . Cancer - Ovarian Sister   . Diabetes Daughter     Social History Social History   Tobacco Use  . Smoking status: Never Smoker  . Smokeless tobacco: Never Used  Vaping Use  . Vaping Use: Never used  Substance Use Topics  . Alcohol use: No  . Drug use: No     Allergies   Losartan, Nsaids, Levofloxacin, Oxycodone hcl, Verapamil, Biaxin  [clarithromycin], Oxycontin [oxycodone], and Vicodin [hydrocodone-acetaminophen]   Review of Systems Review of Systems  Constitutional: Negative for chills and fever.  HENT: Positive for congestion, sinus pressure and sinus pain (Right side). Negative for ear pain, sore throat, trouble swallowing and voice change.   Respiratory: Positive for cough (mild). Negative for shortness of breath.   Cardiovascular: Negative for chest pain and palpitations.  Gastrointestinal: Negative for abdominal pain, diarrhea, nausea and vomiting.  Musculoskeletal: Negative for arthralgias, back pain and myalgias.  Skin: Negative for rash.  Neurological: Positive for headaches (mild frontal). Negative for dizziness and light-headedness.  All other systems reviewed and are negative.    Physical Exam Triage Vital Signs ED Triage Vitals  Enc Vitals Group     BP 10/22/20 0915 (!) 150/74  Pulse Rate 10/22/20 0915 61     Resp 10/22/20 0915 18     Temp 10/22/20 0914 98.2 F (36.8 C)     Temp Source 10/22/20 0915 Oral     SpO2 10/22/20 0915 99 %     Weight --      Height --      Head Circumference --      Peak Flow --      Pain Score 10/22/20 0916 1     Pain Loc --      Pain Edu? --      Excl. in [place]? --    No data found.  Updated Vital Signs BP (!) 150/74 (BP Location: Right Arm)   Pulse 61   Temp 98.2 F (36.8 C)   Resp 18   SpO2 99%   Visual Acuity Right Eye Distance:   Left Eye Distance:   Bilateral Distance:    Right Eye Near:   Left Eye Near:    Bilateral Near:     Physical Exam Vitals and nursing note reviewed.  Constitutional:      General: She is not in acute distress.    Appearance: Normal appearance. She is well-developed and well-nourished. She is not ill-appearing, toxic-appearing or diaphoretic.  HENT:     Head: Normocephalic and atraumatic.     Right Ear: Tympanic membrane and ear canal normal.     Left Ear: Tympanic membrane and ear canal normal.     Nose:  Congestion present.     Right Sinus: Maxillary sinus tenderness (worse than Left side) present. No frontal sinus tenderness.     Left Sinus: Maxillary sinus tenderness present. No frontal sinus tenderness.     Mouth/Throat:     Lips: Pink.     Mouth: Mucous membranes are moist.     Pharynx: Oropharynx is clear. Uvula midline. No pharyngeal swelling, oropharyngeal exudate, posterior oropharyngeal erythema or uvula swelling.  Eyes:     Extraocular Movements: EOM normal.  Cardiovascular:     Rate and Rhythm: Normal rate and regular rhythm.  Pulmonary:     Effort: Pulmonary effort is normal. No respiratory distress.     Breath sounds: Normal breath sounds. No stridor. No wheezing, rhonchi or rales.  Musculoskeletal:        General: Normal range of motion.     Cervical back: Normal range of motion and neck supple. No tenderness.  Lymphadenopathy:     Cervical: No cervical adenopathy.  Skin:    General: Skin is warm and dry.  Neurological:     Mental Status: She is alert and oriented to person, place, and time.  Psychiatric:        Mood and Affect: Mood and affect normal.        Behavior: Behavior normal.      UC Treatments / Results  Labs (all labs ordered are listed, but only abnormal results are displayed) Labs Reviewed - No data to display  EKG   Radiology No results found.  Procedures Procedures (including critical care time)  Medications Ordered in UC Medications - No data to display  Initial Impression / Assessment and Plan / UC Course  I have reviewed the triage vital signs and the nursing notes.  Pertinent labs & imaging results that were available during my care of the patient were reviewed by me and considered in my medical decision making (see chart for details).    Hx and exam c/w maxillary sinusitis Rx: Augmentin Continue flonase as  previously prescribed Pt declined COVID testing but states she ill get tested next week if not improving AVS  given  Final Clinical Impressions(s) / UC Diagnoses   Final diagnoses:  Acute non-recurrent maxillary sinusitis     Discharge Instructions      Please take antibiotics as prescribed and be sure to complete entire course even if you start to feel better to ensure infection does not come back.  Follow up with primary care next week if not improving.    ED Prescriptions    Medication Sig Dispense Auth. Provider   amoxicillin-clavulanate (AUGMENTIN) 875-125 MG tablet Take 1 tablet by mouth 2 (two) times daily. One po bid x 7 days 14 tablet Noe Gens, [place]     PDMP not reviewed this encounter.   Noe Gens, [place] 10/22/20 [phone]

## 2020-10-26 DIAGNOSIS — M47812 Spondylosis without myelopathy or radiculopathy, cervical region: Secondary | ICD-10-CM | POA: Diagnosis not present

## 2020-10-26 DIAGNOSIS — M542 Cervicalgia: Secondary | ICD-10-CM | POA: Diagnosis not present

## 2020-10-26 DIAGNOSIS — M25511 Pain in right shoulder: Secondary | ICD-10-CM | POA: Diagnosis not present

## 2020-10-26 DIAGNOSIS — M1990 Unspecified osteoarthritis, unspecified site: Secondary | ICD-10-CM | POA: Diagnosis not present

## 2020-10-26 DIAGNOSIS — M8580 Other specified disorders of bone density and structure, unspecified site: Secondary | ICD-10-CM | POA: Diagnosis not present

## 2020-10-26 DIAGNOSIS — R29898 Other symptoms and signs involving the musculoskeletal system: Secondary | ICD-10-CM | POA: Diagnosis not present

## 2020-11-02 DIAGNOSIS — M1990 Unspecified osteoarthritis, unspecified site: Secondary | ICD-10-CM | POA: Diagnosis not present

## 2020-11-02 DIAGNOSIS — M8580 Other specified disorders of bone density and structure, unspecified site: Secondary | ICD-10-CM | POA: Diagnosis not present

## 2020-11-02 DIAGNOSIS — M25511 Pain in right shoulder: Secondary | ICD-10-CM | POA: Diagnosis not present

## 2020-11-02 DIAGNOSIS — M47812 Spondylosis without myelopathy or radiculopathy, cervical region: Secondary | ICD-10-CM | POA: Diagnosis not present

## 2020-11-02 DIAGNOSIS — M542 Cervicalgia: Secondary | ICD-10-CM | POA: Diagnosis not present

## 2020-11-02 DIAGNOSIS — R29898 Other symptoms and signs involving the musculoskeletal system: Secondary | ICD-10-CM | POA: Diagnosis not present

## 2020-11-04 DIAGNOSIS — G629 Polyneuropathy, unspecified: Secondary | ICD-10-CM | POA: Diagnosis not present

## 2020-11-04 DIAGNOSIS — M542 Cervicalgia: Secondary | ICD-10-CM | POA: Diagnosis not present

## 2020-11-04 DIAGNOSIS — M7918 Myalgia, other site: Secondary | ICD-10-CM | POA: Diagnosis not present

## 2020-11-09 DIAGNOSIS — M1990 Unspecified osteoarthritis, unspecified site: Secondary | ICD-10-CM | POA: Diagnosis not present

## 2020-11-09 DIAGNOSIS — M542 Cervicalgia: Secondary | ICD-10-CM | POA: Diagnosis not present

## 2020-11-09 DIAGNOSIS — M47812 Spondylosis without myelopathy or radiculopathy, cervical region: Secondary | ICD-10-CM | POA: Diagnosis not present

## 2020-11-09 DIAGNOSIS — M8580 Other specified disorders of bone density and structure, unspecified site: Secondary | ICD-10-CM | POA: Diagnosis not present

## 2020-11-09 DIAGNOSIS — M25511 Pain in right shoulder: Secondary | ICD-10-CM | POA: Diagnosis not present

## 2020-11-09 DIAGNOSIS — R29898 Other symptoms and signs involving the musculoskeletal system: Secondary | ICD-10-CM | POA: Diagnosis not present

## 2020-11-16 DIAGNOSIS — M47812 Spondylosis without myelopathy or radiculopathy, cervical region: Secondary | ICD-10-CM | POA: Diagnosis not present

## 2020-11-16 DIAGNOSIS — M542 Cervicalgia: Secondary | ICD-10-CM | POA: Diagnosis not present

## 2020-11-23 DIAGNOSIS — M47812 Spondylosis without myelopathy or radiculopathy, cervical region: Secondary | ICD-10-CM | POA: Diagnosis not present

## 2020-11-23 DIAGNOSIS — M542 Cervicalgia: Secondary | ICD-10-CM | POA: Diagnosis not present

## 2020-11-30 DIAGNOSIS — M47812 Spondylosis without myelopathy or radiculopathy, cervical region: Secondary | ICD-10-CM | POA: Diagnosis not present

## 2020-11-30 DIAGNOSIS — M542 Cervicalgia: Secondary | ICD-10-CM | POA: Diagnosis not present

## 2020-12-06 ENCOUNTER — Other Ambulatory Visit: Payer: Self-pay | Admitting: *Deleted

## 2020-12-06 DIAGNOSIS — E1149 Type 2 diabetes mellitus with other diabetic neurological complication: Secondary | ICD-10-CM

## 2020-12-06 MED ORDER — METFORMIN HCL 1000 MG PO TABS: ORAL_TABLET | ORAL | 1 refills | Status: DC

## 2020-12-06 MED ORDER — GABAPENTIN 300 MG PO CAPS: ORAL_CAPSULE | ORAL | 1 refills | Status: DC

## 2020-12-07 DIAGNOSIS — M542 Cervicalgia: Secondary | ICD-10-CM | POA: Diagnosis not present

## 2020-12-07 DIAGNOSIS — M47812 Spondylosis without myelopathy or radiculopathy, cervical region: Secondary | ICD-10-CM | POA: Diagnosis not present

## 2020-12-08 ENCOUNTER — Ambulatory Visit (INDEPENDENT_AMBULATORY_CARE_PROVIDER_SITE_OTHER): Payer: Medicare PPO | Admitting: Family Medicine

## 2020-12-08 DIAGNOSIS — Z0000 Encounter for general adult medical examination without abnormal findings: Secondary | ICD-10-CM | POA: Diagnosis not present

## 2020-12-08 DIAGNOSIS — Z780 Asymptomatic menopausal state: Secondary | ICD-10-CM

## 2020-12-08 NOTE — Patient Instructions (Addendum)
MEDICARE ANNUAL WELLNESS VISIT Health Maintenance Summary and Written Plan of Care  [name] ,  Thank you for allowing me to perform your Medicare Annual Wellness Visit and for your ongoing commitment to your health.   Health Maintenance & Immunization History Health Maintenance  Topic Date Due  . COVID-19 Vaccine (3 - Booster for Pfizer series) 12/24/2020 (Originally 05/02/2020)  . HEMOGLOBIN A1C  03/23/2021  . OPHTHALMOLOGY EXAM  07/12/2021  . FOOT EXAM  09/23/2021  . URINE MICROALBUMIN  09/23/2021  . TETANUS/TDAP  10/22/2021  . INFLUENZA VACCINE  Completed  . DEXA SCAN  Completed  . PNA vac Low Risk Adult  Completed  . HPV VACCINES  Aged Out   Immunization History  Administered Date(s) Administered  . Fluad Quad(high Dose 65+) 06/19/2019, 06/23/2020  . Influenza, High Dose Seasonal PF 06/12/2016, 06/04/2017, 06/06/2018  . Influenza,trivalent, recombinat, inj, PF 07/01/2012, 06/16/2013, 05/28/2014, 05/31/2015  . Influenza-Unspecified 05/19/2013  . PFIZER(Purple Top)SARS-COV-2 Vaccination 10/13/2019, 11/03/2019  . Pneumococcal Conjugate-13 07/27/2014  . Pneumococcal-Unspecified 10/23/2011  . Tdap 10/23/2011    These are the patient goals that we discussed: Goals Addressed              This Visit's Progress   .  Patient Stated (pt-stated)        12/08/2020 AWV Goal: Exercise for General Health   Patient will verbalize understanding of the benefits of increased physical activity:  Exercising regularly is important. It will improve your overall fitness, flexibility, and endurance.  Regular exercise also will improve your overall health. It can help you control your weight, reduce stress, and improve your bone density.  Over the next year, patient will increase physical activity as tolerated with a goal of at least 150 minutes of moderate physical activity per week.   You can tell that you are exercising at a moderate intensity if your heart starts beating faster  and you start breathing faster but can still hold a conversation.  Moderate-intensity exercise ideas include:  Walking 1 mile (1.6 km) in about 15 minutes  Biking  Hiking  Golfing  Dancing  Water aerobics  Patient will verbalize understanding of everyday activities that increase physical activity by providing examples like the following: ? Yard work, such as: ? Pushing a Surveyor, mining ? Raking and bagging leaves ? Washing your car ? Pushing a stroller ? Shoveling snow ? Gardening ? Washing windows or floors  Patient will be able to explain general safety guidelines for exercising:   Before you start a new exercise program, talk with your health care provider.  Do not exercise so much that you hurt yourself, feel dizzy, or get very short of breath.  Wear comfortable clothes and wear shoes with good support.  Drink plenty of water while you exercise to prevent dehydration or heat stroke.  Work out until your breathing and your heartbeat get faster.         This is a list of Health Maintenance Items that are overdue or due now: There are no preventive care reminders to display for this patient.   Orders/Referrals Placed Today: Orders Placed This Encounter  Procedures  . DEXAScan    Standing Status:   Future    Standing Expiration Date:   12/08/2021    Scheduling Instructions:     Please call patient to schedule    Order Specific Question:   Reason for exam:    Answer:   post menopausal/ osteoporosis screen    Order Specific Question:  Preferred imaging location?    Answer:   [facility]   (Contact our referral department at [phone] if you have not spoken with someone about your referral appointment within the next 5 days)    Follow-up Plan . Follow-up with Agapito Games, MD as planned . Dexa scan referral has been sent and they will call you to schedule.  . If you change your mind about the Shingles vaccine (Shingrix); that can be done  at any pharmacy. . Medicare wellness in one year.       Health Maintenance, Female Adopting a healthy lifestyle and getting preventive care are important in promoting health and wellness. Ask your health care provider about:  The right schedule for you to have regular tests and exams.  Things you can do on your own to prevent diseases and keep yourself healthy. What should I know about diet, weight, and exercise? Eat a healthy diet  Eat a diet that includes plenty of vegetables, fruits, low-fat dairy products, and lean protein.  Do not eat a lot of foods that are high in solid fats, added sugars, or sodium.   Maintain a healthy weight Body mass index (BMI) is used to identify weight problems. It estimates body fat based on height and weight. Your health care provider can help determine your BMI and help you achieve or maintain a healthy weight. Get regular exercise Get regular exercise. This is one of the most important things you can do for your health. Most adults should:  Exercise for at least 150 minutes each week. The exercise should increase your heart rate and make you sweat (moderate-intensity exercise).  Do strengthening exercises at least twice a week. This is in addition to the moderate-intensity exercise.  Spend less time sitting. Even light physical activity can be beneficial. Watch cholesterol and blood lipids Have your blood tested for lipids and cholesterol at 80 years of age, then have this test every 5 years. Have your cholesterol levels checked more often if:  Your lipid or cholesterol levels are high.  You are older than 82 years of age.  You are at high risk for heart disease. What should I know about cancer screening? Depending on your health history and family history, you may need to have cancer screening at various ages. This may include screening for:  Breast cancer.  Cervical cancer.  Colorectal cancer.  Skin cancer.  Lung cancer. What  should I know about heart disease, diabetes, and high blood pressure? Blood pressure and heart disease  High blood pressure causes heart disease and increases the risk of stroke. This is more likely to develop in people who have high blood pressure readings, are of African descent, or are overweight.  Have your blood pressure checked: ? Every 3-5 years if you are 73-75 years of age. ? Every year if you are 50 years old or older. Diabetes Have regular diabetes screenings. This checks your fasting blood sugar level. Have the screening done:  Once every three years after age 94 if you are at a normal weight and have a low risk for diabetes.  More often and at a younger age if you are overweight or have a high risk for diabetes. What should I know about preventing infection? Hepatitis B If you have a higher risk for hepatitis B, you should be screened for this virus. Talk with your health care provider to find out if you are at risk for hepatitis B infection. Hepatitis C Testing is recommended for:  Everyone born from [birth year] through 1967.  Anyone with known risk factors for hepatitis C. Sexually transmitted infections (STIs)  Get screened for STIs, including gonorrhea and chlamydia, if: ? You are sexually active and are younger than 82 years of age. ? You are older than 82 years of age and your health care provider tells you that you are at risk for this type of infection. ? Your sexual activity has changed since you were last screened, and you are at increased risk for chlamydia or gonorrhea. Ask your health care provider if you are at risk.  Ask your health care provider about whether you are at high risk for HIV. Your health care provider may recommend a prescription medicine to help prevent HIV infection. If you choose to take medicine to prevent HIV, you should first get tested for HIV. You should then be tested every 3 months for as long as you are taking the medicine. Pregnancy  If  you are about to stop having your period (premenopausal) and you may become pregnant, seek counseling before you get pregnant.  Take 400 to 800 micrograms (mcg) of folic acid every day if you become pregnant.  Ask for birth control (contraception) if you want to prevent pregnancy. Osteoporosis and menopause Osteoporosis is a disease in which the bones lose minerals and strength with aging. This can result in bone fractures. If you are 56 years old or older, or if you are at risk for osteoporosis and fractures, ask your health care provider if you should:  Be screened for bone loss.  Take a calcium or vitamin D supplement to lower your risk of fractures.  Be given hormone replacement therapy (HRT) to treat symptoms of menopause. Follow these instructions at home: Lifestyle  Do not use any products that contain nicotine or tobacco, such as cigarettes, e-cigarettes, and chewing tobacco. If you need help quitting, ask your health care provider.  Do not use street drugs.  Do not share needles.  Ask your health care provider for help if you need support or information about quitting drugs. Alcohol use  Do not drink alcohol if: ? Your health care provider tells you not to drink. ? You are pregnant, may be pregnant, or are planning to become pregnant.  If you drink alcohol: ? Limit how much you use to 0-1 drink a day. ? Limit intake if you are breastfeeding.  Be aware of how much alcohol is in your drink. In the [place] one drink equals one 12 oz bottle of beer (355 mL), one 5 oz glass of wine (148 mL), or one 1 oz glass of hard liquor (44 mL). General instructions  Schedule regular health, dental, and eye exams.  Stay current with your vaccines.  Tell your health care provider if: ? You often feel depressed. ? You have ever been abused or do not feel safe at home. Summary  Adopting a healthy lifestyle and getting preventive care are important in promoting health and  wellness.  Follow your health care provider's instructions about healthy diet, exercising, and getting tested or screened for diseases.  Follow your health care provider's instructions on monitoring your cholesterol and blood pressure. This information is not intended to replace advice given to you by your health care provider. Make sure you discuss any questions you have with your health care provider. Document Revised: 08/28/2018 Document Reviewed: 08/28/2018 Elsevier Patient Education  2021 [employer].

## 2020-12-08 NOTE — Progress Notes (Signed)
MEDICARE ANNUAL WELLNESS VISIT  12/08/2020  Telephone Visit Disclaimer This Medicare AWV was conducted by telephone due to national recommendations for restrictions regarding the COVID-19 Pandemic (e.g. social distancing).  I verified, using two identifiers, that I am speaking with [name] or their authorized healthcare agent. I discussed the limitations, risks, security, and privacy concerns of performing an evaluation and management service by telephone and the potential availability of an in-person appointment in the future. The patient expressed understanding and agreed to proceed.  Location of Patient: Home Location of Provider (nurse):  In the office.  Subjective:    [name] is a 80 y.o. female patient of Metheney, Rene Kocher, MD who had a Medicare Annual Wellness Visit today via telephone. Molley is Retired and lives alone. she has 4 children. she reports that she is socially active and does interact with friends/family regularly. she is minimally physically active and enjoys yard work and doing puzzles.  Patient Care Team: Hali Marry, MD as PCP - General (Family Medicine)  Advanced Directives 12/08/2020 03/11/2020 05/01/2019 12/04/2013 11/25/2013 09/25/2013 12/05/2012  Does Patient Have a Medical Advance Directive? Yes Yes Yes Patient has advance directive, copy not in chart Patient has advance directive, copy not in chart Patient has advance directive, copy not in chart Patient has advance directive, copy not in chart  Type of Advance Directive [place];Living will [place];Living will Healthcare Power of [place];Living will -  Does patient want to make changes to medical advance directive? No - Patient declined No - Patient declined No - Patient declined No change requested No change requested No -  Copy of Healthcare Power of Attorney in Chart? No - copy  requested No - copy requested - Copy requested from other (Comment) Copy requested from family Copy requested from family Copy requested from other (Comment)  Pre-existing out of facility DNR order (yellow form or pink MOST form) - - - - - No -    Hospital Utilization Over the Past 12 Months: # of hospitalizations or ER visits: 0 # of surgeries: 0  Review of Systems    Patient reports that her overall health is unchanged compared to last year.  History obtained from chart review and the patient  Patient Reported Readings (BP, Pulse, CBG, Weight, etc) none  Pain Assessment Pain : No/denies pain     Current Medications & Allergies (verified) Allergies as of 12/08/2020      Reactions   Losartan Swelling   Angioedema   Nsaids Swelling   IBU - tongue swlling.    Levofloxacin Swelling   Oxycodone Hcl Swelling   Lip started to swell up   Verapamil Swelling   Biaxin [clarithromycin] Other (See Comments)   Elevated heart rate   Oxycontin [oxycodone]    Lips swell   Vicodin [hydrocodone-acetaminophen]    swelling      Medication List       Accurate as of December 08, 2020 11:47 AM. If you have any questions, ask your nurse or doctor.        Accu-Chek FastClix Lancets Misc For checking blood sugars up to 3 times daily. E11.49   Accu-Chek Guide test strip Generic drug: glucose blood Test twice daily.  DX: E11.49   amoxicillin-clavulanate 875-125 MG tablet Commonly known as: Augmentin Take 1 tablet by mouth 2 (two) times daily. One po bid x 7 days   aspirin 81 MG  chewable tablet Chew 1 tablet by mouth daily.   blood glucose meter kit and supplies Dispense based on patient and insurance preference. For testing up to twice daily. E11.49   carvedilol 6.25 MG tablet Commonly known as: COREG TAKE 1 TABLET(6.25 MG) BY MOUTH TWICE DAILY WITH A MEAL   cetirizine 10 MG tablet Commonly known as: ZYRTEC Take 10 mg by mouth daily.   clobetasol ointment 0.05 % Commonly  known as: TEMOVATE Apply 1 application topically 2 (two) times daily.   dorzolamide 2 % ophthalmic solution Commonly known as: TRUSOPT 1 drop 2 (two) times daily. What changed: Another medication with the same name was removed. Continue taking this medication, and follow the directions you see here.   fluticasone 50 MCG/ACT nasal spray Commonly known as: FLONASE Place 2 sprays into both nostrils daily. What changed: additional instructions   gabapentin 300 MG capsule Commonly known as: NEURONTIN TAKE 1 CAPSULE(300 MG) BY MOUTH TWICE DAILY   hydrochlorothiazide 25 MG tablet Commonly known as: HYDRODIURIL TAKE 1 TABLET(25 MG) BY MOUTH DAILY   ipratropium-albuterol 0.5-2.5 (3) MG/3ML Soln Commonly known as: DUONEB Take 3 mLs by nebulization every 6 (six) hours as needed.   metFORMIN 1000 MG tablet Commonly known as: GLUCOPHAGE TAKE 1 TABLET(1000 MG) BY MOUTH TWICE DAILY WITH A MEAL   mineral oil-hydrophilic petrolatum ointment Apply topically as needed for dry skin. Apply to hands and arms 4 times a day   simvastatin 80 MG tablet Commonly known as: ZOCOR Take 1 tablet (80 mg total) by mouth at bedtime.   sitaGLIPtin 25 MG tablet Commonly known as: Januvia Take 1 tablet (25 mg total) by mouth daily.   triamcinolone ointment 0.5 % Commonly known as: KENALOG Apply 1 application topically 2 (two) times daily as needed. For rash and itching.       History (reviewed): Past Medical History:  Diagnosis Date  . Arthritis    "back; left hip; knees" (12/04/2013)  . Asthma   . Chronic bronchitis ([place])    "usually get it twice/yr" (12/04/2013)  . Diabetes mellitus without complication ([place])   . Environmental and seasonal allergies   . GERD (gastroesophageal reflux disease)   . High cholesterol   . Hypertension    pcp   [place]     . Pneumonia    as a child   Past Surgical History:  Procedure Laterality Date  . ABDOMINAL HYSTERECTOMY    . BLADDER SUSPENSION    .  COLONOSCOPY    . JOINT REPLACEMENT    . SHOULDER ARTHROSCOPY W/ ROTATOR CUFF REPAIR Right   . 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 Hypertension Mother   . Heart disease Mother   . Transient ischemic attack Mother   . CAD Father   . Cancer - Ovarian Sister   . Diabetes Daughter    Social History   Socioeconomic History  . Marital status: Widowed    Spouse name: Not on file  . Number of children: 4  . Years of education: 12th Grade  .  Highest education level: High school graduate  Occupational History    Comment: Retired  Tobacco Use  . Smoking status: Never Smoker  . Smokeless tobacco: Never Used  Vaping Use  . Vaping Use: Never used  Substance and Sexual Activity  . Alcohol use: No  . Drug use: No  . Sexual activity: Yes  Other Topics Concern  . Not on file  Social History Narrative   Retired. Widowed. Husband died 16-Jan-2019. Lives alone. She enjoys doing puzzles and yardwork.   Social Determinants of Health   Financial Resource Strain: Low Risk   . Difficulty of Paying Living Expenses: Not hard at all  Food Insecurity: No Food Insecurity  . Worried About Charity fundraiser in the Last Year: Never true  . Ran Out of Food in the Last Year: Never true  Transportation Needs: No Transportation Needs  . Lack of Transportation (Medical): No  . Lack of Transportation (Non-Medical): No  Physical Activity: Inactive  . Days of Exercise per Week: 0 days  . Minutes of Exercise per Session: 0 min  Stress: No Stress Concern Present   . Feeling of Stress : Not at all  Social Connections: Moderately Integrated  . Frequency of Communication with Friends and Family: More than three times a week  . Frequency of Social Gatherings with Friends and Family: Once a week  . Attends Religious Services: More than 4 times per year  . Active Member of Clubs or Organizations: Yes  . Attends Archivist Meetings: More than 4 times per year  . Marital Status: Widowed    Activities of Daily Living In your present state of health, do you have any difficulty performing the following activities: 12/08/2020  Hearing? N  Vision? N  Difficulty concentrating or making decisions? N  Walking or climbing stairs? N  Dressing or bathing? N  Doing errands, shopping? N  Preparing Food and eating ? N  Using the Toilet? N  In the past six months, have you accidently leaked urine? Y  Comment she has noticed at night she does have some leakage  Do you have problems with loss of bowel control? N  Managing your Medications? N  Managing your Finances? N  Housekeeping or managing your Housekeeping? N  Some recent data might be hidden    Patient Education/ Literacy How often do you need to have someone help you when you read instructions, pamphlets, or other written materials from your doctor or pharmacy?: 1 - Never What is the last grade level you completed in school?: 12th grade.  Exercise Current Exercise Habits: The patient does not participate in regular exercise at present, Exercise limited by: None identified  Diet Patient reports consuming 3 meals a day and 0-1 snack(s) a day Patient reports that her primary diet is: Regular Patient reports that she does have regular access to food.   Depression Screen PHQ 2/9 Scores 12/08/2020 09/23/2020 05/01/2019  PHQ - 2 Score 0 0 0     Fall Risk Fall Risk  12/08/2020 05/01/2019  Falls in the past year? 0 0  Number falls in past yr: 0 0  Injury with Fall? 0 0  Risk for fall due to : No  Fall Risks -  Follow up Falls evaluation completed -     Objective:  [name] seemed alert and oriented and she participated appropriately during our telephone visit.  Blood Pressure Weight BMI  BP Readings from Last 3 Encounters:  10/22/20 (!) 150/74  09/23/20 126/62  06/23/20 135/80   Wt Readings from Last 3 Encounters:  09/23/20 166 lb (75.3 kg)  06/23/20 168 lb 0.6 oz (76.2 kg)  04/17/20 170 lb (77.1 kg)   BMI Readings from Last 1 Encounters:  09/23/20 27.62 kg/m    *Unable to obtain current vital signs, weight, and BMI due to telephone visit type  Hearing/Vision  . Mikell did not seem to have difficulty with hearing/understanding during the telephone conversation . Reports that she has had a formal eye exam by an eye care professional within the past year . Reports that she has not had a formal hearing evaluation within the past year *Unable to fully assess hearing and vision during telephone visit type  Cognitive Function: 6CIT Screen 12/08/2020  What Year? 0 points  What month? 0 points  What time? 0 points  Count back from 20 0 points  Months in reverse 0 points  Repeat phrase 0 points  Total Score 0   (Normal:0-7, Significant for Dysfunction: >8)  Normal Cognitive Function Screening: Yes   Immunization & Health Maintenance Record Immunization History  Administered Date(s) Administered  . Fluad Quad(high Dose 65+) 06/19/2019, 06/23/2020  . Influenza, High Dose Seasonal PF 06/12/2016, 06/04/2017, 06/06/2018  . Influenza,trivalent, recombinat, inj, PF 07/01/2012, 06/16/2013, 05/28/2014, 05/31/2015  . Influenza-Unspecified 05/19/2013  . PFIZER(Purple Top)SARS-COV-2 Vaccination 10/13/2019, 11/03/2019  . Pneumococcal Conjugate-13 07/27/2014  . Pneumococcal-Unspecified 10/23/2011  . Tdap 10/23/2011    Health Maintenance  Topic Date Due  . COVID-19 Vaccine (3 - Booster for Pfizer series) 12/24/2020 (Originally 05/02/2020)  . HEMOGLOBIN A1C  03/23/2021   . OPHTHALMOLOGY EXAM  07/12/2021  . FOOT EXAM  09/23/2021  . URINE MICROALBUMIN  09/23/2021  . TETANUS/TDAP  10/22/2021  . INFLUENZA VACCINE  Completed  . DEXA SCAN  Completed  . PNA vac Low Risk Adult  Completed  . HPV VACCINES  Aged Out       Assessment  This is a routine wellness examination for Alwyn Pea.  Health Maintenance: Due or Overdue There are no preventive care reminders to display for this patient.  Alwyn Pea does not need a referral for Community Assistance: Care Management:   no Social Work:    no Prescription Assistance:  no Nutrition/Diabetes Education:  no   Plan:  Personalized Goals Goals Addressed              This Visit's Progress   .  Patient Stated (pt-stated)        12/08/2020 AWV Goal: Exercise for General Health   Patient will verbalize understanding of the benefits of increased physical activity:  Exercising regularly is important. It will improve your overall fitness, flexibility, and endurance.  Regular exercise also will improve your overall health. It can help you control your weight, reduce stress, and improve your bone density.  Over the next year, patient will increase physical activity as tolerated with a goal of at least 150 minutes of moderate physical activity per week.   You can tell that you are exercising at a moderate intensity if your heart starts beating faster and you start breathing faster but can still hold a conversation.  Moderate-intensity exercise ideas include:  Walking 1 mile (1.6 km) in about 15 minutes  Biking  Hiking  Golfing  Dancing  Water aerobics  Patient will verbalize understanding of everyday activities that increase physical activity by providing examples like the following: ? Yard work, such as: ? Pushing a Surveyor, mining ? Raking and bagging  leaves ? Washing your car ? Pushing a stroller ? Shoveling snow ? Gardening ? Washing windows or floors  Patient will be able to  explain general safety guidelines for exercising:   Before you start a new exercise program, talk with your health care provider.  Do not exercise so much that you hurt yourself, feel dizzy, or get very short of breath.  Wear comfortable clothes and wear shoes with good support.  Drink plenty of water while you exercise to prevent dehydration or heat stroke.  Work out until your breathing and your heartbeat get faster.       Personalized Health Maintenance & Screening Recommendations  Bone densitometry screening Shingles vaccine  Lung Cancer Screening Recommended: no (Low Dose CT Chest recommended if Age 1-80 years, 30 pack-year currently smoking OR have quit w/in past 15 years) Hepatitis C Screening recommended: no HIV Screening recommended: no  Advanced Directives: Written information was not prepared per patient's request.  Referrals & Orders Orders Placed This Encounter  Procedures  . [place]    Follow-up Plan . Follow-up with Hali Marry, MD as planned . Dexa scan referral has been sent and they will call you to schedule.  . If you change your mind about the Shingles vaccine (Shingrix); that can be done at any pharmacy. . Medicare wellness in one year.   I have personally reviewed and noted the following in the patient's chart:   . Medical and social history . Use of alcohol, tobacco or illicit drugs  . Current medications and supplements . Functional ability and status . Nutritional status . Physical activity . Advanced directives . List of other physicians . Hospitalizations, surgeries, and ER visits in previous 12 months . Vitals . Screenings to include cognitive, depression, and falls . Referrals and appointments  In addition, I have reviewed and discussed with [name] certain preventive protocols, quality metrics, and best practice recommendations. A written personalized care plan for preventive services as well as general preventive  health recommendations is available and can be mailed to the patient at her request.      Tinnie Gens, RN  12/08/2020

## 2020-12-10 ENCOUNTER — Encounter: Payer: Self-pay | Admitting: Podiatry

## 2020-12-10 ENCOUNTER — Other Ambulatory Visit: Payer: Self-pay

## 2020-12-10 ENCOUNTER — Ambulatory Visit: Payer: Medicare PPO | Admitting: Podiatry

## 2020-12-10 DIAGNOSIS — E119 Type 2 diabetes mellitus without complications: Secondary | ICD-10-CM

## 2020-12-10 DIAGNOSIS — M79674 Pain in right toe(s): Secondary | ICD-10-CM

## 2020-12-10 DIAGNOSIS — M79675 Pain in left toe(s): Secondary | ICD-10-CM | POA: Diagnosis not present

## 2020-12-10 DIAGNOSIS — E1149 Type 2 diabetes mellitus with other diabetic neurological complication: Secondary | ICD-10-CM | POA: Diagnosis not present

## 2020-12-10 DIAGNOSIS — R2681 Unsteadiness on feet: Secondary | ICD-10-CM

## 2020-12-10 DIAGNOSIS — B351 Tinea unguium: Secondary | ICD-10-CM

## 2020-12-10 NOTE — Patient Instructions (Signed)
Diabetes Mellitus and Foot Care Foot care is an important part of your health, especially when you have diabetes. Diabetes may cause you to have problems because of poor blood flow (circulation) to your feet and legs, which can cause your skin to:  Become thinner and drier.  Break more easily.  Heal more slowly.  Peel and crack. You may also have nerve damage (neuropathy) in your legs and feet, causing decreased feeling in them. This means that you may not notice minor injuries to your feet that could lead to more serious problems. Noticing and addressing any potential problems early is the best way to prevent future foot problems. How to care for your feet Foot hygiene  Wash your feet daily with warm water and mild soap. Do not use hot water. Then, pat your feet and the areas between your toes until they are completely dry. Do not soak your feet as this can dry your skin.  Trim your toenails straight across. Do not dig under them or around the cuticle. File the edges of your nails with an emery board or nail file.  Apply a moisturizing lotion or petroleum jelly to the skin on your feet and to dry, brittle toenails. Use lotion that does not contain alcohol and is unscented. Do not apply lotion between your toes.   Shoes and socks  Wear clean socks or stockings every day. Make sure they are not too tight. Do not wear knee-high stockings since they may decrease blood flow to your legs.  Wear shoes that fit properly and have enough cushioning. Always look in your shoes before you put them on to be sure there are no objects inside.  To break in new shoes, wear them for just a few hours a day. This prevents injuries on your feet. Wounds, scrapes, corns, and calluses  Check your feet daily for blisters, cuts, bruises, sores, and redness. If you cannot see the bottom of your feet, use a mirror or ask someone for help.  Do not cut corns or calluses or try to remove them with medicine.  If you  find a minor scrape, cut, or break in the skin on your feet, keep it and the skin around it clean and dry. You may clean these areas with mild soap and water. Do not clean the area with peroxide, alcohol, or iodine.  If you have a wound, scrape, corn, or callus on your foot, look at it several times a day to make sure it is healing and not infected. Check for: ? Redness, swelling, or pain. ? Fluid or blood. ? Warmth. ? Pus or a bad smell.   General tips  Do not cross your legs. This may decrease blood flow to your feet.  Do not use heating pads or hot water bottles on your feet. They may burn your skin. If you have lost feeling in your feet or legs, you may not know this is happening until it is too late.  Protect your feet from hot and cold by wearing shoes, such as at the beach or on hot pavement.  Schedule a complete foot exam at least once a year (annually) or more often if you have foot problems. Report any cuts, sores, or bruises to your health care provider immediately. Where to find more information  American Diabetes Association: www.diabetes.org  Association of Diabetes Care & Education Specialists: www.diabeteseducator.org Contact a health care provider if:  You have a medical condition that increases your risk of infection and   you have any cuts, sores, or bruises on your feet.  You have an injury that is not healing.  You have redness on your legs or feet.  You feel burning or tingling in your legs or feet.  You have pain or cramps in your legs and feet.  Your legs or feet are numb.  Your feet always feel cold.  You have pain around any toenails. Get help right away if:  You have a wound, scrape, corn, or callus on your foot and: ? You have pain, swelling, or redness that gets worse. ? You have fluid or blood coming from the wound, scrape, corn, or callus. ? Your wound, scrape, corn, or callus feels warm to the touch. ? You have pus or a bad smell coming from  the wound, scrape, corn, or callus. ? You have a fever. ? You have a red line going up your leg. Summary  Check your feet every day for blisters, cuts, bruises, sores, and redness.  Apply a moisturizing lotion or petroleum jelly to the skin on your feet and to dry, brittle toenails.  Wear shoes that fit properly and have enough cushioning.  If you have foot problems, report any cuts, sores, or bruises to your health care provider immediately.  Schedule a complete foot exam at least once a year (annually) or more often if you have foot problems. This information is not intended to replace advice given to you by your health care provider. Make sure you discuss any questions you have with your health care provider. Document Revised: 03/25/2020 Document Reviewed: 03/25/2020 Elsevier Patient Education  2021 [employer]  

## 2020-12-10 NOTE — Progress Notes (Signed)
Subjective:   Patient ID: [name], female   DOB: 80 y.o.   MRN: [id number]   HPI 80-year-old female presents the office for diabetic foot exam as well as for thick, elongated toenails that she cannot trim her self.  She denies any ulcerations.  She does get occasional tingling.  She states that she does have some balance issues but no recent falls.  She is currently on gabapentin.  Last A1c was 7.1  Review of Systems  All other systems reviewed and are negative.  Past Medical History:  Diagnosis Date  . Arthritis    "back; left hip; knees" (12/04/2013)  . Asthma   . Chronic bronchitis ([place])    "usually get it twice/yr" (12/04/2013)  . Diabetes mellitus without complication ([place])   . Environmental and seasonal allergies   . GERD (gastroesophageal reflux disease)   . High cholesterol   . Hypertension    pcp   [place]     . Pneumonia    as a child    Past Surgical History:  Procedure Laterality Date  . ABDOMINAL HYSTERECTOMY    . BLADDER SUSPENSION    . COLONOSCOPY    . JOINT REPLACEMENT    . SHOULDER ARTHROSCOPY W/ ROTATOR CUFF REPAIR Right   . 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   Procedure: TOTAL HIP ARTHROPLASTY;  Surgeon: Newt Minion, MD;  Location: [place];  Service: Orthopedics;  Laterality: Left;  Left Total Hip Arthroplasty  . 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   Procedure: TOTAL KNEE ARTHROPLASTY;  Surgeon: Newt Minion, MD;  Location: [place];  Service: Orthopedics;  Laterality: Right;  Right Total Knee Arthroplasty     Current Outpatient Medications:  .  Accu-Chek FastClix Lancets MISC, For checking blood sugars up to 3 times daily. E11.49, Disp: 306 each, Rfl: 4 .  ACCU-CHEK GUIDE test strip, Test twice daily.  DX: E11.49, Disp: 200 each, Rfl: 6 .  amoxicillin-clavulanate (AUGMENTIN) 875-125 MG tablet, Take 1 tablet by mouth 2 (two) times daily. One po bid x 7 days (Patient not taking: Reported on  12/08/2020), Disp: 14 tablet, Rfl: 0 .  aspirin 81 MG chewable tablet, Chew 1 tablet by mouth daily., Disp: , Rfl:  .  blood glucose meter kit and supplies, Dispense based on patient and insurance preference. For testing up to twice daily. E11.49, Disp: 1 each, Rfl: 0 .  carvedilol (COREG) 6.25 MG tablet, TAKE 1 TABLET(6.25 MG) BY MOUTH TWICE DAILY WITH A MEAL, Disp: 180 tablet, Rfl: 1 .  cetirizine (ZYRTEC) 10 MG tablet, Take 10 mg by mouth daily., Disp: , Rfl:  .  clobetasol ointment (TEMOVATE) 0.17 %, Apply 1 application topically 2 (two) times daily., Disp: , Rfl:  .  dorzolamide (TRUSOPT) 2 % ophthalmic solution, 1 drop 2 (two) times daily., Disp: , Rfl:  .  fluticasone (FLONASE) 50 MCG/ACT nasal spray, Place 2 sprays into both nostrils daily. (Patient taking differently: Place 2 sprays into both nostrils daily. As needed), Disp: 16 g, Rfl: 3 .  gabapentin (NEURONTIN) 300 MG capsule, TAKE 1 CAPSULE(300 MG) BY MOUTH TWICE DAILY, Disp: 180 capsule, Rfl: 1 .  hydrochlorothiazide (HYDRODIURIL) 25 MG tablet, TAKE 1 TABLET(25 MG) BY MOUTH DAILY, Disp: 90 tablet, Rfl: 1 .  ipratropium-albuterol (DUONEB) 0.5-2.5 (3) MG/3ML SOLN, Take 3 mLs by nebulization every 6 (six) hours as needed., Disp: 120 mL, Rfl: 1 .  metFORMIN (GLUCOPHAGE) 1000 MG tablet, TAKE 1 TABLET(1000  MG) BY MOUTH TWICE DAILY WITH A MEAL, Disp: 180 tablet, Rfl: 1 .  mineral oil-hydrophilic petrolatum (AQUAPHOR) ointment, Apply topically as needed for dry skin. Apply to hands and arms 4 times a day, Disp: 420 g, Rfl: 0 .  simvastatin (ZOCOR) 80 MG tablet, Take 1 tablet (80 mg total) by mouth at bedtime., Disp: 90 tablet, Rfl: 3 .  sitaGLIPtin (JANUVIA) 25 MG tablet, Take 1 tablet (25 mg total) by mouth daily., Disp: 30 tablet, Rfl: 2 .  triamcinolone ointment (KENALOG) 0.5 %, Apply 1 application topically 2 (two) times daily as needed. For rash and itching., Disp: 30 g, Rfl: 1  Allergies  Allergen Reactions  . Losartan Swelling     Angioedema  . Nsaids Swelling    IBU - tongue swlling.   . Levofloxacin Swelling  . Oxycodone Hcl Swelling    Lip started to swell up  . Verapamil Swelling  . Biaxin [Clarithromycin] Other (See Comments)    Elevated heart rate  . Oxycontin [Oxycodone]     Lips swell  . Vicodin [Hydrocodone-Acetaminophen]     swelling         Objective:  Physical Exam  General: AAO x3, NAD  Dermatological: Nails are hypertrophic, dystrophic, brittle, discolored, elongated 10. No surrounding redness or drainage. Tenderness nails 1-5 bilaterally. No open lesions or pre-ulcerative lesions are identified today.  Vascular: Dorsalis Pedis artery and Posterior Tibial artery pedal pulses are 2/4 bilateral with immedate capillary fill time.There is no pain with calf compression, swelling, warmth, erythema.   Neruologic: Grossly intact via light touch bilateral.  Sensation appears to be intact with Thornell Mule monofilament although she does describe tingling to her feet.  Musculoskeletal: No gross boney pedal deformities bilateral. No pain, crepitus, or limitation noted with foot and ankle range of motion bilateral. Muscular strength 5/5 in all groups tested bilateral.      Assessment:   Symptomatic onychomycosis, type 2 diabetes with neuropathy, gait instability     Plan:  -Treatment options discussed including all alternatives, risks, and complications -Etiology of symptoms were discussed -Nails debrided 10 without complications or bleeding. -Given the gait instability I have ordered physical therapy. -Continue gabapentin.  Consider increase the dose if needed. -Daily foot inspection -Follow-up in 3 months or sooner if any problems arise. In the meantime, encouraged to call the office with any questions, concerns, change in symptoms.   Celesta Gentile, DPM

## 2020-12-14 DIAGNOSIS — M47812 Spondylosis without myelopathy or radiculopathy, cervical region: Secondary | ICD-10-CM | POA: Diagnosis not present

## 2020-12-14 DIAGNOSIS — M542 Cervicalgia: Secondary | ICD-10-CM | POA: Diagnosis not present

## 2020-12-15 ENCOUNTER — Encounter: Payer: Self-pay | Admitting: Family Medicine

## 2020-12-15 ENCOUNTER — Encounter: Payer: Self-pay | Admitting: Physical Therapy

## 2020-12-15 ENCOUNTER — Other Ambulatory Visit: Payer: Self-pay

## 2020-12-15 ENCOUNTER — Ambulatory Visit (INDEPENDENT_AMBULATORY_CARE_PROVIDER_SITE_OTHER): Payer: Medicare PPO | Admitting: Physical Therapy

## 2020-12-15 DIAGNOSIS — M6281 Muscle weakness (generalized): Secondary | ICD-10-CM | POA: Diagnosis not present

## 2020-12-15 DIAGNOSIS — R2689 Other abnormalities of gait and mobility: Secondary | ICD-10-CM | POA: Diagnosis not present

## 2020-12-15 NOTE — Therapy (Signed)
[facility] [place] [address] [place], [place], [address] Phone: [phone]   Fax:  [phone]  Physical Therapy Evaluation  Patient Details  Name: [name] MRN: [id number] Date of Birth: Jul 07, 1941 Referring Provider (PT): wagoner, Molli Hazard   Encounter Date: 12/15/2020   PT End of Session - 12/15/20 1013    Visit Number 1    Number of Visits 12    Date for PT Re-Evaluation 01/26/21    PT Start Time 0930    PT Stop Time 1010    PT Time Calculation (min) 40 min    Equipment Utilized During Treatment Gait belt    Activity Tolerance Patient tolerated treatment well    Behavior During Therapy [facility] for tasks assessed/performed           Past Medical History:  Diagnosis Date  . Arthritis    "back; left hip; knees" (12/04/2013)  . Asthma   . Chronic bronchitis (HCC)    "usually get it twice/yr" (12/04/2013)  . Diabetes mellitus without complication (H[facility])   . Environmental and seasonal allergies   . GERD (gastroesophageal reflux disease)   . High cholesterol   . Hypertension    pcp   [employer]     . Pneumonia    as a child    Past Surgical History:  Procedure Laterality Date  . ABDOMINAL HYSTERECTOMY    . BLADDER SUSPENSION    . COLONOSCOPY    . JOINT REPLACEMENT    . SHOULDER ARTHROSCOPY W/ ROTATOR CUFF REPAIR Right   . 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   Procedure: TOTAL HIP ARTHROPLASTY;  Surgeon: Nadara Mustard, MD;  [facility];  Service: Orthopedics;  Laterality: Left;  Left Total Hip Arthroplasty  . 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   Procedure: TOTAL KNEE ARTHROPLASTY;  Surgeon: Nadara Mustard, MD;  [facility];  Service: Orthopedics;  Laterality: Right;  Right Total Knee Arthroplasty    There were no vitals filed for this visit.    Subjective Assessment - 12/15/20 0933    Subjective Pt states she has been diagnosed with neuropathy in  her feet and MD recommends physical therapy for balance. She reports no falls but she uses a cane for walking outdoors or on uneven surfaces    Pertinent History Diabetes, Rt total knee, Lt total hip    Limitations Walking;Standing    How long can you stand comfortably? 10 minutes    How long can you walk comfortably? 5 minutes    Currently in Pain? No/denies              [facility] PT Assessment - 12/15/20 0001      Assessment   Medical Diagnosis type 2 diabetes, gait and balance disorder    Referring Provider (PT) wagoner, matthew      Balance Screen   Has the patient fallen in the past 6 months No      Home Environment   Living Environment Private residence    Home Access Ramped entrance    Home Layout Laundry or work area in basement    Home Equipment [place] - single point      Prior Function   Level of Independence Independent      Sensation   Light Touch Impaired Detail    Light Touch Impaired Details Impaired RLE;Impaired LLE      ROM / Strength   AROM / PROM / Strength Strength  Strength   Overall Strength Comments Lt hip flex 4/5, Rt hip flex 3/5, bilat knee ext 4/5, bilat knee flex 3/5, bilat DF 3+/5      Transfers   Five time sit to stand comments  23.1 seconds      Ambulation/Gait   Gait Pattern Decreased arm swing - right;Decreased arm swing - left;Decreased step length - right;Decreased step length - left;Antalgic    Gait velocity decreased      Standardized Balance Assessment   Standardized Balance Assessment Berg Balance Test      Berg Balance Test   Sit to Stand Able to stand  independently using hands    Standing Unsupported Able to stand safely 2 minutes    Sitting with Back Unsupported but Feet Supported on Floor or Stool Able to sit safely and securely 2 minutes    Stand to Sit Uses backs of legs against chair to control descent    Transfers Able to transfer safely, definite need of hands    Standing Unsupported with Eyes Closed Able to stand  3 seconds    Standing Unsupported with Feet Together Needs help to attain position and unable to hold for 15 seconds    From Standing, Reach Forward with Outstretched Arm Can reach forward >12 cm safely (5")    From Standing Position, Pick up Object from Floor Able to pick up shoe, needs supervision    From Standing Position, Turn to Look Behind Over each Shoulder Turn sideways only but maintains balance    Turn 360 Degrees Able to turn 360 degrees safely but slowly    Standing Unsupported, Alternately Place Feet on Step/Stool Able to complete >2 steps/needs minimal assist    Standing Unsupported, One Foot in [employer] balance while stepping or standing    Standing on One Leg Unable to try or needs assist to prevent fall    Total Score 29                      Objective measurements completed on examination: See above findings.       [facility] Adult PT Treatment/Exercise - 12/15/20 0001      Exercises   Exercises Knee/Hip      Knee/Hip Exercises: Standing   Heel Raises 10 reps    Knee Flexion Strengthening;Both;10 reps    Hip Abduction Stengthening;Both;10 reps    Other Standing Knee Exercises tandem stance at counter      Knee/Hip Exercises: Seated   Sit to Sand 10 reps                  PT Education - 12/15/20 1004    Education Details HEP, PT POC and goals, high fall risk    Person(s) Educated Patient    Methods Explanation;Demonstration;Handout    Comprehension Returned demonstration;Verbalized understanding               PT Long Term Goals - 12/15/20 1018      PT LONG TERM GOAL #1   Title Pt will be independent with HEP    Time 6    Period Weeks    Status New    Target Date 01/26/21      PT LONG TERM GOAL #2   Title Pt will improve bilat LE strength to 4/5 to improve activity tolerance    Time 6    Period Weeks    Status New    Target Date 01/26/21      PT LONG  TERM GOAL #3   Title Pt will improve Berg balance score to 38/56 to demo  decreased risk of falls    Time 6    Period Weeks    Status New    Target Date 01/26/21      PT LONG TERM GOAL #4   Title Pt will tolerate walking x 10 minutes to be demo improved functional activity tolerance    Time 6    Period Weeks    Status New    Target Date 01/26/21                  Plan - 12/15/20 1014    Clinical Impression Statement Pt is an 80 y/o female who presents with decreased strength, balance and activity tolerance and impaired gait and mobility. Pt will benefit from skilled PT to address deficits and improve functional mobility and independence    Personal Factors and Comorbidities Age;Fitness;Comorbidity 3+    Examination-Activity Limitations Stairs;Stand;Squat;Lift;Locomotion Level;Carry    Stability/Clinical Decision Making Stable/Uncomplicated    Clinical Decision Making Low    Rehab Potential Good    PT Frequency 2x / week    PT Duration 6 weeks    PT Treatment/Interventions Patient/family education;Therapeutic activities;Therapeutic exercise;Balance training;Neuromuscular re-education;Functional mobility training;Stair training;Gait training;DME Instruction;Aquatic Therapy;ADLs/Self Care Home Management    PT Next Visit Plan assess HEP. assess stairs and 6 minute walk    PT Home Exercise Plan [email or web address]    Consulted and Agree with Plan of Care Patient           Patient will benefit from skilled therapeutic intervention in order to improve the following deficits and impairments:  Abnormal gait,Decreased balance,Decreased endurance,Decreased activity tolerance,Decreased coordination  Visit Diagnosis: Balance disorder - Plan: PT plan of care cert/re-cert  Other abnormalities of gait and mobility - Plan: PT plan of care cert/re-cert  Muscle weakness (generalized) - Plan: PT plan of care cert/re-cert     Problem List Patient Active Problem List   Diagnosis Date Noted  . DDD (degenerative disc disease), cervical 03/03/2020  . BMI  28.0-28.9,adult 05/01/2019  . Chronic kidney disease (CKD), stage III (moderate) (HCC) 03/30/2019  . Primary open angle glaucoma (POAG) of left eye, mild stage 11/05/2018  . 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 Presence of orthopedic joint implant 12/03/2013  . 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 DDD (degenerative disc disease), lumbar 01/05/2012  . Osteopenia 07/18/2010  . Asthma in adult 07/18/2010  . Backache 11/01/2009  . Anemia, unspecified 09/14/2009  . VITAMIN D DEFICIENCY 09/02/2009  . Hyperlipidemia 09/02/2009  . Essential hypertension 09/02/2009  . ALLERGIC RHINITIS 09/02/2009  . REFLUX ESOPHAGITIS 09/02/2009  . URINARY INCONTINENCE, 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   Breda Bond, PT  [place] Toure 12/15/2020, 10:24 AM  [facility] [address] [place], [place], [address] Phone: [phone]   Fax:  [phone]  Name: IDELL HISSONG MRN: [id number] Date of Birth: June 28, 1941

## 2020-12-15 NOTE — Patient Instructions (Signed)
Access Code: [email or web address] URL: [email or web address] Date: 12/15/2020 Prepared by: Reggy Eye  Exercises Standing Hip Abduction with Counter Support - 1 x daily - 5 x weekly - 2 sets - 10 reps Standing Knee Flexion with Counter Support - 1 x daily - 5 x weekly - 2 sets - 10 reps Heel rises with counter support - 1 x daily - 5 x weekly - 2 sets - 10 reps Standing Tandem Balance with Counter Support - 1 x daily - 5 x weekly - 3 sets - 1 reps - 20-30 seconds hold Sit to Stand - 1 x daily - 5 x weekly - 2 sets - 10 reps

## 2020-12-17 ENCOUNTER — Ambulatory Visit: Payer: Medicare PPO | Admitting: Rehabilitative and Restorative Service Providers"

## 2020-12-17 ENCOUNTER — Other Ambulatory Visit: Payer: Self-pay

## 2020-12-17 DIAGNOSIS — M6281 Muscle weakness (generalized): Secondary | ICD-10-CM | POA: Diagnosis not present

## 2020-12-17 DIAGNOSIS — R2689 Other abnormalities of gait and mobility: Secondary | ICD-10-CM

## 2020-12-17 NOTE — Therapy (Signed)
[facility] [place] [address] [place], [place], [address] Phone: [phone]   Fax:  [phone]  Physical Therapy Treatment  Patient Details  Name: [name] MRN: [id number] Date of Birth: [birth year] Referring Provider (PT): wagoner, Molli Hazard   Encounter Date: 12/17/2020   PT End of Session - 12/17/20 0932    Visit Number 2    Number of Visits 12    Date for PT Re-Evaluation 01/26/21    PT Start Time 0930    PT Stop Time 1010    PT Time Calculation (min) 40 min    Equipment Utilized During Treatment Gait belt    Activity Tolerance Patient tolerated treatment well    Behavior During Therapy [facility] for tasks assessed/performed           Past Medical History:  Diagnosis Date  . Arthritis    "back; left hip; knees" (12/04/2013)  . Asthma   . Chronic bronchitis (HCC)    "usually get it twice/yr" (12/04/2013)  . Diabetes mellitus without complication ([facility]CC)   . Environmental and seasonal allergies   . GERD (gastroesophageal reflux disease)   . High cholesterol   . Hypertension    pcp   [employer]     . Pneumonia    as a child    Past Surgical History:  Procedure Laterality Date  . ABDOMINAL HYSTERECTOMY    . BLADDER SUSPENSION    . COLONOSCOPY    . JOINT REPLACEMENT    . SHOULDER ARTHROSCOPY W/ ROTATOR CUFF REPAIR Right   . 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   Procedure: TOTAL HIP ARTHROPLASTY;  Surgeon: Nadara Mustard, MD;  [facility];  Service: Orthopedics;  Laterality: Left;  Left Total Hip Arthroplasty  . 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   Procedure: TOTAL KNEE ARTHROPLASTY;  Surgeon: Nadara Mustard, MD;  [facility];  Service: Orthopedics;  Laterality: Right;  Right Total Knee Arthroplasty    There were no vitals filed for this visit.   Subjective Assessment - 12/17/20 0925    Subjective The patient reports she didn't need her cane walking in  today and doesn't use it indoors.    Pertinent History Diabetes, Rt total knee, Lt total hip    Patient Stated Goals keep my balance better and not fall    Currently in Pain? No/denies              [facility] PT Assessment - 12/17/20 0932      Assessment   Medical Diagnosis type 2 diabetes, gait and balance disorder    Referring Provider (PT) wagoner, matthew      Ambulation/Gait   Ambulation/Gait Yes    Gait Pattern Trendelenburg;Decreased arm swing - right;Decreased arm swing - left;Decreased step length - right;Decreased step length - left      6 Minute Walk- Baseline   6 Minute Walk- Baseline yes      6 minute walk test results    Aerobic Endurance Distance Walked 465    Endurance additional comments stopped at 2:00, rested unti 3:55, then walked 3.25 laps.   No device used today                        [facility] Adult PT Treatment/Exercise - 12/17/20 0932      Ambulation/Gait   Stairs Yes    Stairs Assistance 6: Modified independent (Device/Increase time)    Stair Management  Technique Step to pattern;Two rails    Number of Stairs 6    Gait Comments obstacle course negotiation between cones and bending to tap various height surfaces (handle of kettle bells) to mimic functional strengthening for household activities      Neuro Re-ed    Neuro Re-ed Details  Standing dynamic balance activities performing backwards walking x 10 feet x 5 reps without UE support with SBA to CGA; standing rocker board with lateral weight shifts and then proactive balance alternating UE reaches; standing  alternating LE foot taps to 4" surface R and L, then alternating step ups with UE support and CGA.      Exercises   Exercises Knee/Hip      Knee/Hip Exercises: Stretches   Hip Flexor Stretch Right;Left;10 seconds   not able to tolerate   Hip Flexor Stretch Limitations too tight to tolerate modified thomas test position      Knee/Hip Exercises: Seated   Sit to Sand 10 reps   adding  yoga block overhead as she rises to stand for core engagement     Knee/Hip Exercises: Supine   Bridges 10 reps;Both      Knee/Hip Exercises: Sidelying   Hip ABduction Strengthening;Right;Left;10 reps                       PT Long Term Goals - 12/15/20 1018      PT LONG TERM GOAL #1   Title Pt will be independent with HEP    Time 6    Period Weeks    Status New    Target Date 01/26/21      PT LONG TERM GOAL #2   Title Pt will improve bilat LE strength to 4/5 to improve activity tolerance    Time 6    Period Weeks    Status New    Target Date 01/26/21      PT LONG TERM GOAL #3   Title Pt will improve Berg balance score to 38/56 to demo decreased risk of falls    Time 6    Period Weeks    Status New    Target Date 01/26/21      PT LONG TERM GOAL #4   Title Pt will tolerate walking x 10 minutes to be demo improved functional activity tolerance    Time 6    Period Weeks    Status New    Target Date 01/26/21                 Plan - 12/17/20 1012    Clinical Impression Statement The patient has hip weakness that contributes to instability during gait.  She tolerated high level balance tasks and LE strengthening well today.  Obtained baselines for 6 minute walk test and stair negotiation.  Continue to LTGs.    PT Treatment/Interventions Patient/family education;Therapeutic activities;Therapeutic exercise;Balance training;Neuromuscular re-education;Functional mobility training;Stair training;Gait training;DME Instruction;Aquatic Therapy;ADLs/Self Care Home Management    PT Next Visit Plan high level balance, dynamic hip stability and balance tasks, LE strengthening, gait    PT Home Exercise Plan [email or web address]    Consulted and Agree with Plan of Care Patient           Patient will benefit from skilled therapeutic intervention in order to improve the following deficits and impairments:     Visit Diagnosis: Balance disorder  Other abnormalities of gait and  mobility  Muscle weakness (generalized)     Problem List Patient Active Problem List  Diagnosis Date Noted  . DDD (degenerative disc disease), cervical 03/03/2020  . BMI 28.0-28.9,adult 05/01/2019  . Chronic kidney disease (CKD), stage III (moderate) (HCC) 03/30/2019  . Primary open angle glaucoma (POAG) of left eye, mild stage 11/05/2018  . 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 Presence of orthopedic joint implant 12/03/2013  . 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 DDD (degenerative disc disease), lumbar 01/05/2012  . Osteopenia 07/18/2010  . Asthma in adult 07/18/2010  . Backache 11/01/2009  . Anemia, unspecified 09/14/2009  . VITAMIN D DEFICIENCY 09/02/2009  . Hyperlipidemia 09/02/2009  . Essential hypertension 09/02/2009  . ALLERGIC RHINITIS 09/02/2009  . REFLUX ESOPHAGITIS 09/02/2009  . URINARY INCONTINENCE, 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    Sheng Pritz , PT 12/17/2020, 1:40 PM  [facility] [address] [place], [place], [address] Phone: [phone]   Fax:  [phone]  Name: [name] MRN: [id number] Date of Birth: 1941-02-06

## 2020-12-20 ENCOUNTER — Other Ambulatory Visit: Payer: Self-pay | Admitting: Physician Assistant

## 2020-12-20 DIAGNOSIS — I10 Essential (primary) hypertension: Secondary | ICD-10-CM

## 2020-12-21 ENCOUNTER — Ambulatory Visit: Payer: Medicare PPO | Admitting: Physical Therapy

## 2020-12-21 ENCOUNTER — Other Ambulatory Visit: Payer: Self-pay

## 2020-12-21 DIAGNOSIS — R2689 Other abnormalities of gait and mobility: Secondary | ICD-10-CM

## 2020-12-21 DIAGNOSIS — M6281 Muscle weakness (generalized): Secondary | ICD-10-CM | POA: Diagnosis not present

## 2020-12-21 NOTE — Therapy (Signed)
[facility] [place] [address] [place], [place], [address] Phone: [phone]   Fax:  [phone]  Physical Therapy Treatment  Patient Details  Name: [name] MRN: [id number] Date of Birth: [birth year] Referring Provider (PT): wagoner, Molli Hazard   Encounter Date: 12/21/2020   PT End of Session - 12/21/20 0924    Visit Number 3    Number of Visits 12    Date for PT Re-Evaluation 01/26/21    PT Start Time 0845    PT Stop Time 0925    PT Time Calculation (min) 40 min    Equipment Utilized During Treatment Gait belt    Activity Tolerance Patient tolerated treatment well    Behavior During Therapy [facility] for tasks assessed/performed           Past Medical History:  Diagnosis Date  . Arthritis    "back; left hip; knees" (12/04/2013)  . Asthma   . Chronic bronchitis (HCC)    "usually get it twice/yr" (12/04/2013)  . Diabetes mellitus without complication (HCC)   . Environmental and seasonal allergies   . GERD (gastroesophageal reflux disease)   . High cholesterol   . Hypertension    pcp   [employer]     . Pneumonia    as a child    Past Surgical History:  Procedure Laterality Date  . ABDOMINAL HYSTERECTOMY    . BLADDER SUSPENSION    . COLONOSCOPY    . JOINT REPLACEMENT    . SHOULDER ARTHROSCOPY W/ ROTATOR CUFF REPAIR Right   . 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   Procedure: TOTAL HIP ARTHROPLASTY;  Surgeon: Nadara Mustard, MD;  [facility] OR;  Service: Orthopedics;  Laterality: Left;  Left Total Hip Arthroplasty  . 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   Procedure: TOTAL KNEE ARTHROPLASTY;  Surgeon: Nadara Mustard, MD;  [facility] OR;  Service: Orthopedics;  Laterality: Right;  Right Total Knee Arthroplasty    There were no vitals filed for this visit.   Subjective Assessment - 12/21/20 0846    Subjective Pt reports she feels "Ok so far" today. Pt states she was  able to walk in her yard with her son this past weekend. She has tried some of her HEP    Currently in Pain? No/denies                             [facility] Adult PT Treatment/Exercise - 12/21/20 0001      Ambulation/Gait   Gait Comments sidestepping and backward walking at counter with intermittent UE support      Neuro Re-ed    Neuro Re-ed Details  standing tap ups to 4'' step, static stance with Lt foot on 4'' step with CGA then with UE lifts to engage core and challenge balance with pt requiring min/mod A, rocker board A/P and laterally with focus on ankle strnegth and coordination      Knee/Hip Exercises: Aerobic   Nustep L5 x 5 minutes      Knee/Hip Exercises: Standing   Heel Raises Both;10 reps    Heel Raises Limitations then toe raises x 10    Knee Flexion Strengthening;Both;2 sets;10 reps      Knee/Hip Exercises: Supine   Bridges Strengthening;10 reps      Knee/Hip Exercises: Sidelying   Hip ABduction Strengthening;Both;10 reps  PT Long Term Goals - 12/15/20 1018      PT LONG TERM GOAL #1   Title Pt will be independent with HEP    Time 6    Period Weeks    Status New    Target Date 01/26/21      PT LONG TERM GOAL #2   Title Pt will improve bilat LE strength to 4/5 to improve activity tolerance    Time 6    Period Weeks    Status New    Target Date 01/26/21      PT LONG TERM GOAL #3   Title Pt will improve Berg balance score to 38/56 to demo decreased risk of falls    Time 6    Period Weeks    Status New    Target Date 01/26/21      PT LONG TERM GOAL #4   Title Pt will tolerate walking x 10 minutes to be demo improved functional activity tolerance    Time 6    Period Weeks    Status New    Target Date 01/26/21                 Plan - 12/21/20 0924    Clinical Impression Statement Pt continues with difficulty with stance phase on Rt LE. She tolerates higher level balance tasks well, occasionally  requiring min/mod A for LOB    PT Next Visit Plan high level balance, dynamic hip stability and balance tasks, LE strengthening, gait    PT Home Exercise Plan [id number]           Patient will benefit from skilled therapeutic intervention in order to improve the following deficits and impairments:     Visit Diagnosis: Balance disorder  Other abnormalities of gait and mobility  Muscle weakness (generalized)     Problem List Patient Active Problem List   Diagnosis Date Noted  . DDD (degenerative disc disease), cervical 03/03/2020  . BMI 28.0-28.9,adult 05/01/2019  . Chronic kidney disease (CKD), stage III (moderate) (HCC) 03/30/2019  . Primary open angle glaucoma (POAG) of left eye, mild stage 11/05/2018  . 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 Presence of orthopedic joint implant 12/03/2013  . 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 DDD (degenerative disc disease), lumbar 01/05/2012  . Osteopenia 07/18/2010  . Asthma in adult 07/18/2010  . Backache 11/01/2009  . Anemia, unspecified 09/14/2009  . VITAMIN D DEFICIENCY 09/02/2009  . Hyperlipidemia 09/02/2009  . Essential hypertension 09/02/2009  . ALLERGIC RHINITIS 09/02/2009  . REFLUX ESOPHAGITIS 09/02/2009  . URINARY INCONTINENCE, 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   Star Cheese, PT  Izak Anding 12/21/2020, 9:26 AM  [facility] [address] [place], [place], [address] Phone: [phone]   Fax:  [phone]  Name: BEYLA LONEY MRN: [id number] Date of Birth: 09-18-1941

## 2020-12-22 ENCOUNTER — Ambulatory Visit: Payer: Medicare PPO | Admitting: Family Medicine

## 2020-12-22 ENCOUNTER — Encounter: Payer: Self-pay | Admitting: Family Medicine

## 2020-12-22 VITALS — BP 125/74 | HR 52 | Ht 65.0 in | Wt 168.0 lb

## 2020-12-22 DIAGNOSIS — Z23 Encounter for immunization: Secondary | ICD-10-CM | POA: Diagnosis not present

## 2020-12-22 DIAGNOSIS — I10 Essential (primary) hypertension: Secondary | ICD-10-CM | POA: Diagnosis not present

## 2020-12-22 DIAGNOSIS — E1149 Type 2 diabetes mellitus with other diabetic neurological complication: Secondary | ICD-10-CM | POA: Diagnosis not present

## 2020-12-22 DIAGNOSIS — M79671 Pain in right foot: Secondary | ICD-10-CM | POA: Diagnosis not present

## 2020-12-22 LAB — POCT GLYCOSYLATED HEMOGLOBIN (HGB A1C): Hemoglobin A1C: 6.9 % — AB (ref 4.0–5.6)

## 2020-12-22 MED ORDER — AMBULATORY NON FORMULARY MEDICATION: 0 refills | Status: DC

## 2020-12-22 MED ORDER — SITAGLIPTIN PHOSPHATE 25 MG PO TABS: 25.0000 mg | ORAL_TABLET | Freq: Every day | ORAL | 1 refills | Status: DC

## 2020-12-22 MED ORDER — CAPSAICIN-MENTHOL 0.025-10 % EX GEL: 1.0000 "application " | Freq: Every day | CUTANEOUS | 99 refills | Status: DC

## 2020-12-22 NOTE — Progress Notes (Signed)
Established Patient Office Visit  Subjective:  Patient ID: [name], female    DOB: [birth year]  Age: 80 y.o. MRN: [id number]  CC:  Chief Complaint  Patient presents with  . Diabetes    HPI [name] presents for 3 mo f/u -    Diabetes - no hypoglycemic events. No wounds or sores that are not healing well. No increased thirst or urination. Checking glucose at home. Taking medications as prescribed without any side effects.  She is now seeing podiatry.    Hypertension- Pt denies chest pain, SOB, dizziness, or heart palpitations.  Taking meds as directed w/o problems.  Denies medication side effects.    She still getting a sharp pain particularly in her right foot she says it always seems to bother her the most at night she did mention it to the podiatrist.  He did treat a callus and has referred her for gait instability to rehab she has been for about 3 sessions so far.  She is already on gabapentin 3 mg twice a day and wants to know if maybe she should go up on her gabapentin or try a cream.  Past Medical History:  Diagnosis Date  . Arthritis    "back; left hip; knees" (12/04/2013)  . Asthma   . Chronic bronchitis ([place])    "usually get it twice/yr" (12/04/2013)  . Diabetes mellitus without complication ([place])   . Environmental and seasonal allergies   . GERD (gastroesophageal reflux disease)   . High cholesterol   . Hypertension    pcp   [place]     . Pneumonia    as a child    Past Surgical History:  Procedure Laterality Date  . ABDOMINAL HYSTERECTOMY    . BLADDER SUSPENSION    . COLONOSCOPY    . JOINT REPLACEMENT    . SHOULDER ARTHROSCOPY W/ ROTATOR CUFF REPAIR Right   . 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   Procedure: TOTAL HIP ARTHROPLASTY;  Surgeon: Newt Minion, MD;  Location: [place];  Service: Orthopedics;  Laterality: Left;  Left Total Hip Arthroplasty  . 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    Procedure: TOTAL KNEE ARTHROPLASTY;  Surgeon: Newt Minion, MD;  Location: [place];  Service: Orthopedics;  Laterality: Right;  Right Total Knee Arthroplasty    Family History  Problem Relation Age of Onset  . Hypertension Mother   . Heart disease Mother   . Transient ischemic attack Mother   . CAD Father   . Cancer - Ovarian Sister   . Diabetes Daughter     Social History   Socioeconomic History  . Marital status: Widowed    Spouse name: Not on file  . Number of children: 4  . Years of education: 12th Grade  . Highest education level: High school graduate  Occupational History    Comment: Retired  Tobacco Use  . Smoking status: Never Smoker  . Smokeless tobacco: Never Used  Vaping Use  . Vaping Use: Never used  Substance and Sexual Activity  . Alcohol use: No  . Drug use: No  . Sexual activity: Yes  Other Topics Concern  . Not on file  Social History Narrative   Retired. Widowed. Husband died 20-Jan-2019. Lives alone. She enjoys doing puzzles and yardwork.   Social Determinants of Health   Financial Resource Strain: Low Risk   . Difficulty of Paying Living Expenses: Not hard at all  Food Insecurity: No Food Insecurity  . Worried About Charity fundraiser in the Last Year: Never true  . Ran Out of Food in the Last Year: Never true  Transportation Needs: No Transportation Needs  . Lack of Transportation (Medical): No  . Lack of Transportation (Non-Medical): No  Physical Activity: Inactive  . Days of Exercise per Week: 0 days  . Minutes of Exercise per Session: 0 min  Stress: No Stress Concern Present  . Feeling of Stress : Not at all  Social Connections: Moderately Integrated  . Frequency of Communication with Friends and Family: More than three times a week  . Frequency of Social Gatherings with Friends and Family: Once a week  . Attends Religious Services: More than 4 times per year  . Active Member of Clubs or Organizations: Yes  . Attends Archivist  Meetings: More than 4 times per year  . Marital Status: Widowed  Intimate Partner Violence: Not At Risk  . Fear of Current or Ex-Partner: No  . Emotionally Abused: No  . Physically Abused: No  . Sexually Abused: No    Outpatient Medications Prior to Visit  Medication Sig Dispense Refill  . carvedilol (COREG) 6.25 MG tablet TAKE 1 TABLET(6.25 MG) BY MOUTH TWICE DAILY WITH A MEAL 180 tablet 0  . Accu-Chek FastClix Lancets MISC For checking blood sugars up to 3 times daily. E11.49 306 each 4  . ACCU-CHEK GUIDE test strip Test twice daily.  DX: E11.49 200 each 6  . aspirin 81 MG chewable tablet Chew 1 tablet by mouth daily.    . blood glucose meter kit and supplies Dispense based on patient and insurance preference. For testing up to twice daily. E11.49 1 each 0  . cetirizine (ZYRTEC) 10 MG tablet Take 10 mg by mouth daily.    . clobetasol ointment (TEMOVATE) 6.96 % Apply 1 application topically 2 (two) times daily.    . dorzolamide (TRUSOPT) 2 % ophthalmic solution 1 drop 2 (two) times daily.    . fluticasone (FLONASE) 50 MCG/ACT nasal spray Place 2 sprays into both nostrils daily. (Patient taking differently: Place 2 sprays into both nostrils daily. As needed) 16 g 3  . gabapentin (NEURONTIN) 300 MG capsule TAKE 1 CAPSULE(300 MG) BY MOUTH TWICE DAILY 180 capsule 1  . hydrochlorothiazide (HYDRODIURIL) 25 MG tablet TAKE 1 TABLET(25 MG) BY MOUTH DAILY 90 tablet 1  . ipratropium-albuterol (DUONEB) 0.5-2.5 (3) MG/3ML SOLN Take 3 mLs by nebulization every 6 (six) hours as needed. 120 mL 1  . metFORMIN (GLUCOPHAGE) 1000 MG tablet TAKE 1 TABLET(1000 MG) BY MOUTH TWICE DAILY WITH A MEAL 180 tablet 1  . mineral oil-hydrophilic petrolatum (AQUAPHOR) ointment Apply topically as needed for dry skin. Apply to hands and arms 4 times a day 420 g 0  . simvastatin (ZOCOR) 80 MG tablet Take 1 tablet (80 mg total) by mouth at bedtime. 90 tablet 3  . amoxicillin-clavulanate (AUGMENTIN) 875-125 MG tablet Take 1  tablet by mouth 2 (two) times daily. One po bid x 7 days (Patient not taking: No sig reported) 14 tablet 0  . sitaGLIPtin (JANUVIA) 25 MG tablet Take 1 tablet (25 mg total) by mouth daily. 30 tablet 2  . triamcinolone ointment (KENALOG) 0.5 % Apply 1 application topically 2 (two) times daily as needed. For rash and itching. 30 g 1   No facility-administered medications prior to visit.    Allergies  Allergen Reactions  . Losartan Swelling    Angioedema  .  Nsaids Swelling    IBU - tongue swlling.   . Levofloxacin Swelling  . Oxycodone Hcl Swelling    Lip started to swell up  . Verapamil Swelling  . Biaxin [Clarithromycin] Other (See Comments)    Elevated heart rate  . Oxycontin [Oxycodone]     Lips swell  . Vicodin [Hydrocodone-Acetaminophen]     swelling    ROS Review of Systems    Objective:    Physical Exam Constitutional:      Appearance: She is well-developed.  HENT:     Head: Normocephalic and atraumatic.  Cardiovascular:     Rate and Rhythm: Normal rate and regular rhythm.     Heart sounds: Normal heart sounds.  Pulmonary:     Effort: Pulmonary effort is normal.     Breath sounds: Normal breath sounds.  Skin:    General: Skin is warm and dry.  Neurological:     Mental Status: She is alert and oriented to person, place, and time.  Psychiatric:        Behavior: Behavior normal.     There were no vitals taken for this visit. Wt Readings from Last 3 Encounters:  09/23/20 166 lb (75.3 kg)  06/23/20 168 lb 0.6 oz (76.2 kg)  04/17/20 170 lb (77.1 kg)     There are no preventive care reminders to display for this patient.  There are no preventive care reminders to display for this patient.  No results found for: TSH Lab Results  Component Value Date   WBC 11.5 (H) 06/23/2020   HGB 12.0 06/23/2020   HCT 36.3 06/23/2020   MCV 85.6 06/23/2020   PLT 261 06/23/2020   Lab Results  Component Value Date   NA 138 06/23/2020   K 4.5 06/23/2020   CO2 28  06/23/2020   GLUCOSE 128 (H) 06/23/2020   BUN 20 06/23/2020   CREATININE 0.97 (H) 06/23/2020   BILITOT 0.3 06/23/2020   ALKPHOS 83 03/27/2019   AST 11 06/23/2020   ALT 7 06/23/2020   PROT 7.2 06/23/2020   ALBUMIN 3.7 09/25/2013   CALCIUM 9.9 06/23/2020   Lab Results  Component Value Date   CHOL 130 06/23/2020   Lab Results  Component Value Date   HDL 28 (L) 06/23/2020   Lab Results  Component Value Date   LDLCALC 69 06/23/2020   Lab Results  Component Value Date   TRIG 239 (H) 06/23/2020   Lab Results  Component Value Date   CHOLHDL 4.6 06/23/2020   Lab Results  Component Value Date   HGBA1C 6.9 (A) 12/22/2020      Assessment & Plan:   Problem List Items Addressed This Visit      Cardiovascular and Mediastinum   Essential hypertension - Primary   Relevant Orders   BASIC METABOLIC PANEL WITH GFR     Endocrine   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[place])    Once he does look better today at 6.9.  Continue current regimen.  Due for BMP.  Plan to follow-up in 3 months.      Relevant Medications   sitaGLIPtin (JANUVIA) 25 MG tablet   Other Relevant Orders   POCT glycosylated hemoglobin (Hb A1C) (Completed)   BASIC METABOLIC PANEL WITH GFR    Other Visit Diagnoses    Need for shingles vaccine       Relevant Medications   AMBULATORY NON FORMULARY MEDICATION   Right foot pain         Dsicussed  need for shingles vaccine.  Prescription printed for her to take to the pharmacy since it should be covered under her Medicare part D.  Right foot pain-she gets sharp intermittent pains mostly at night.  Discussed a trial of a capsaicin cream/gel.  If not helpful then consider adjusting her gabapentin.  Meds ordered this encounter  Medications  . sitaGLIPtin (JANUVIA) 25 MG tablet    Sig: Take 1 tablet (25 mg total) by mouth daily.    Dispense:  90 tablet    Refill:  1  . AMBULATORY NON FORMULARY MEDICATION    Sig: Medication Name:  Shingrix IM x 1. Repeat in 2-6 mo    Dispense:  1 Units    Refill:  0  . Capsaicin-Menthol 0.025-10 % GEL    Sig: Apply 1 application topically at bedtime. To feet    Dispense:  45 g    Refill:  PRN    Follow-up: Return in about 3 months (around 03/23/2021) for Diabetes follow-up.    Beatrice Lecher, MD

## 2020-12-22 NOTE — Assessment & Plan Note (Signed)
Once he does look better today at 6.9.  Continue current regimen.  Due for BMP.  Plan to follow-up in 3 months.

## 2020-12-23 LAB — BASIC METABOLIC PANEL WITH GFR
BUN: 23 mg/dL (ref 7–25)
CO2: 26 mmol/L (ref 20–32)
Calcium: 9.7 mg/dL (ref 8.6–10.4)
Chloride: 101 mmol/L (ref 98–110)
Creat: 0.88 mg/dL (ref 0.60–0.88)
GFR, Est African American: 71 mL/min/{1.73_m2} (ref >=60)
GFR, Est Non African American: 61 mL/min/{1.73_m2} (ref >=60)
Glucose, Bld: 115 mg/dL — ABNORMAL HIGH (ref 65–99)
Potassium: 4.2 mmol/L (ref 3.5–5.3)
Sodium: 140 mmol/L (ref 135–146)

## 2020-12-24 ENCOUNTER — Ambulatory Visit: Payer: Medicare PPO | Admitting: Physical Therapy

## 2020-12-24 ENCOUNTER — Other Ambulatory Visit: Payer: Self-pay

## 2020-12-24 DIAGNOSIS — R2689 Other abnormalities of gait and mobility: Secondary | ICD-10-CM | POA: Diagnosis not present

## 2020-12-24 DIAGNOSIS — M6281 Muscle weakness (generalized): Secondary | ICD-10-CM

## 2020-12-24 NOTE — Therapy (Signed)
[facility] [place] [address] [place], [place], [address] Phone: [phone]   Fax:  [phone]  Physical Therapy Treatment  Patient Details  Name: [name] MRN: [id number] Date of Birth: 10-02-1940 Referring Provider (PT): wagoner, Molli Hazard   Encounter Date: 12/24/2020   PT End of Session - 12/24/20 1014    Visit Number 4    Number of Visits 12    Date for PT Re-Evaluation 01/26/21    PT Start Time 0845    PT Stop Time 0927    PT Time Calculation (min) 42 min    Equipment Utilized During Treatment Gait belt    Activity Tolerance Patient tolerated treatment well           Past Medical History:  Diagnosis Date  . Arthritis    "back; left hip; knees" (12/04/2013)  . Asthma   . Chronic bronchitis (HCC)    "usually get it twice/yr" (12/04/2013)  . Diabetes mellitus without complication (HCC)   . Environmental and seasonal allergies   . GERD (gastroesophageal reflux disease)   . High cholesterol   . Hypertension    pcp   [employer]     . Pneumonia    as a child    Past Surgical History:  Procedure Laterality Date  . ABDOMINAL HYSTERECTOMY    . BLADDER SUSPENSION    . COLONOSCOPY    . JOINT REPLACEMENT    . SHOULDER ARTHROSCOPY W/ ROTATOR CUFF REPAIR Right   . 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   Procedure: TOTAL HIP ARTHROPLASTY;  Surgeon: Nadara Mustard, MD;  [facility];  Service: Orthopedics;  Laterality: Left;  Left Total Hip Arthroplasty  . 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   Procedure: TOTAL KNEE ARTHROPLASTY;  Surgeon: Nadara Mustard, MD;  [facility];  Service: Orthopedics;  Laterality: Right;  Right Total Knee Arthroplasty    There were no vitals filed for this visit.   Subjective Assessment - 12/24/20 0932    Subjective Pt states "nothing new"    Patient Stated Goals keep my balance better and not fall    Currently in Pain? No/denies                              [facility] Adult PT Treatment/Exercise - 12/24/20 0001      Ambulation/Gait   Gait Comments obstacle course with focus on stepping over obstacles with SBA/CGA      Neuro Re-ed    Neuro Re-ed Details  standing tap ups 8'' step with 1 LOB requiring min A to correct, static stance with 1 LE on 8'' step 2 x 30 sec bilat      Knee/Hip Exercises: Aerobic   Nustep L5 x 5 minutes      Knee/Hip Exercises: Standing   Heel Raises Both;10 reps    Heel Raises Limitations then toe raises x 10 2#    Knee Flexion Strengthening;Both;10 reps    Knee Flexion Limitations 2#    Hip Abduction Stengthening;Both;10 reps    Abduction Limitations 2#    Forward Step Up Both;10 reps;Hand Hold: 1;Step Height: 6"    Step Down Both;5 reps    Step Down Limitations curb step simulation with min A/HHA, then curb step simulation with SPC with superivsion      Knee/Hip Exercises: Seated   Abduction/Adduction  Strengthening;Both;20 reps    Abd/Adduction Limitations  red TB    Sit to [employer] 10 reps                  PT Education - 12/24/20 1014    Education Details education on importance of using SPC when out of the house    Person(s) Educated Patient    Methods Explanation;Demonstration    Comprehension Verbalized understanding;Returned demonstration               PT Long Term Goals - 12/15/20 1018      PT LONG TERM GOAL #1   Title Pt will be independent with HEP    Time 6    Period Weeks    Status New    Target Date 01/26/21      PT LONG TERM GOAL #2   Title Pt will improve bilat LE strength to 4/5 to improve activity tolerance    Time 6    Period Weeks    Status New    Target Date 01/26/21      PT LONG TERM GOAL #3   Title Pt will improve Berg balance score to 38/56 to demo decreased risk of falls    Time 6    Period Weeks    Status New    Target Date 01/26/21      PT LONG TERM GOAL #4   Title Pt will tolerate walking x 10 minutes to be  demo improved functional activity tolerance    Time 6    Period Weeks    Status New    Target Date 01/26/21                 Plan - 12/24/20 1014    Clinical Impression Statement Pt with difficulty with stepping down from curb step without AD, improves with use of SPC. Pt educated on importance of use of SPC when out of the house uneven surfaces    PT Next Visit Plan high level balance, dynamic hip stability and balance tasks, LE strengthening, gait    PT Home Exercise Plan [id number]    Consulted and Agree with Plan of Care Patient           Patient will benefit from skilled therapeutic intervention in order to improve the following deficits and impairments:     Visit Diagnosis: Balance disorder  Other abnormalities of gait and mobility  Muscle weakness (generalized)     Problem List Patient Active Problem List   Diagnosis Date Noted  . DDD (degenerative disc disease), cervical 03/03/2020  . BMI 28.0-28.9,adult 05/01/2019  . Chronic kidney disease (CKD), stage III (moderate) (HCC) 03/30/2019  . Primary open angle glaucoma (POAG) of left eye, mild stage 11/05/2018  . 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 Presence of orthopedic joint implant 12/03/2013  . 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 DDD (degenerative disc disease), lumbar 01/05/2012  . Osteopenia 07/18/2010  . Asthma in adult 07/18/2010  . Backache 11/01/2009  . Anemia, unspecified 09/14/2009  . VITAMIN D DEFICIENCY 09/02/2009  . Hyperlipidemia 09/02/2009  . Essential hypertension 09/02/2009  . ALLERGIC RHINITIS 09/02/2009  . REFLUX ESOPHAGITIS 09/02/2009  . URINARY INCONTINENCE, 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   Loxley Cibrian, PT  Lelar Farewell 12/24/2020, 10:17 AM  [facility] [address] [place], [place], [address] Phone: [phone]    Fax:  [phone]  Name: NASIYAH Mullen MRN: [id number] Date of Birth: 03-27-1941

## 2020-12-28 ENCOUNTER — Encounter: Payer: Medicare PPO | Admitting: Rehabilitative and Restorative Service Providers"

## 2020-12-30 ENCOUNTER — Ambulatory Visit: Payer: Medicare PPO | Admitting: Physical Therapy

## 2020-12-30 ENCOUNTER — Other Ambulatory Visit: Payer: Self-pay

## 2020-12-30 DIAGNOSIS — M6281 Muscle weakness (generalized): Secondary | ICD-10-CM

## 2020-12-30 DIAGNOSIS — R2689 Other abnormalities of gait and mobility: Secondary | ICD-10-CM

## 2020-12-30 NOTE — Therapy (Signed)
[facility] [place] [address] [place], [place], [address] Phone: [phone]   Fax:  [phone]  Physical Therapy Treatment  Patient Details  Name: [name] MRN: [id number] Date of Birth: Nov 08, 1940 Referring Provider (PT): wagoner, Molli Hazard   Encounter Date: 12/30/2020   PT End of Session - 12/30/20 1055    Visit Number 5    Number of Visits 12    Date for PT Re-Evaluation 01/26/21    PT Start Time 1015    PT Stop Time 1057    PT Time Calculation (min) 42 min    Equipment Utilized During Treatment Gait belt    Activity Tolerance Patient tolerated treatment well    Behavior During Therapy [facility] for tasks assessed/performed           Past Medical History:  Diagnosis Date  . Arthritis    "back; left hip; knees" (12/04/2013)  . Asthma   . Chronic bronchitis (HCC)    "usually get it twice/yr" (12/04/2013)  . Diabetes mellitus without complication (HCC)   . Environmental and seasonal allergies   . GERD (gastroesophageal reflux disease)   . High cholesterol   . Hypertension    pcp   [employer]     . Pneumonia    as a child    Past Surgical History:  Procedure Laterality Date  . ABDOMINAL HYSTERECTOMY    . BLADDER SUSPENSION    . COLONOSCOPY    . JOINT REPLACEMENT    . SHOULDER ARTHROSCOPY W/ ROTATOR CUFF REPAIR Right   . 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   Procedure: TOTAL HIP ARTHROPLASTY;  Surgeon: Nadara Mustard, MD;  Location: [facility] OR;  Service: Orthopedics;  Laterality: Left;  Left Total Hip Arthroplasty  . 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   Procedure: TOTAL KNEE ARTHROPLASTY;  Surgeon: Nadara Mustard, MD;  Location: [facility] OR;  Service: Orthopedics;  Laterality: Right;  Right Total Knee Arthroplasty    There were no vitals filed for this visit.   Subjective Assessment - 12/30/20 1018    Subjective Pt states she has "nothing to report". States she is  getting "some better"    Patient Stated Goals keep my balance better and not fall    Currently in Pain? No/denies                             [facility] Adult PT Treatment/Exercise - 12/30/20 0001      Ambulation/Gait   Gait Comments stepping over obstacles forward and laterally with intermittent HHA/CGA      Neuro Re-ed    Neuro Re-ed Details  coordination box step with intermittent UE support, standing tap ups alternating LEs 8'' step, static stance with 1 LE on 8'' step 30 sec bilat, 3 way cone tap x 10 bilat LE, cone pick up with supervision      Knee/Hip Exercises: Aerobic   Nustep L5 x 5 minutes      Knee/Hip Exercises: Standing   Heel Raises Both;10 reps    Heel Raises Limitations then toe raises x 10, 2#    Knee Flexion Strengthening;Both;10 reps    Knee Flexion Limitations 2#    Hip Abduction Stengthening;Both;10 reps    Abduction Limitations 2#      Knee/Hip Exercises: Seated   Long Arc Quad Strengthening;Both;10 reps    Long Arc Quad Weight 2 lbs.    Sit  to [employer] 10 reps                       PT Long Term Goals - 12/15/20 1018      PT LONG TERM GOAL #1   Title Pt will be independent with HEP    Time 6    Period Weeks    Status New    Target Date 01/26/21      PT LONG TERM GOAL #2   Title Pt will improve bilat LE strength to 4/5 to improve activity tolerance    Time 6    Period Weeks    Status New    Target Date 01/26/21      PT LONG TERM GOAL #3   Title Pt will improve Berg balance score to 38/56 to demo decreased risk of falls    Time 6    Period Weeks    Status New    Target Date 01/26/21      PT LONG TERM GOAL #4   Title Pt will tolerate walking x 10 minutes to be demo improved functional activity tolerance    Time 6    Period Weeks    Status New    Target Date 01/26/21                 Plan - 12/30/20 1055    Clinical Impression Statement Pt with improved ability to step over obstacles this visit. continues  with difficulty with prolonged stance on Lt LE.    PT Next Visit Plan reassess balance and strength, progress dynamic gait    PT Home Exercise Plan [email or web address]    Consulted and Agree with Plan of Care Patient           Patient will benefit from skilled therapeutic intervention in order to improve the following deficits and impairments:     Visit Diagnosis: Balance disorder  Other abnormalities of gait and mobility  Muscle weakness (generalized)     Problem List Patient Active Problem List   Diagnosis Date Noted  . DDD (degenerative disc disease), cervical 03/03/2020  . BMI 28.0-28.9,adult 05/01/2019  . Chronic kidney disease (CKD), stage III (moderate) (HCC) 03/30/2019  . Primary open angle glaucoma (POAG) of left eye, mild stage 11/05/2018  . 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 Presence of orthopedic joint implant 12/03/2013  . 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 DDD (degenerative disc disease), lumbar 01/05/2012  . Osteopenia 07/18/2010  . Asthma in adult 07/18/2010  . Backache 11/01/2009  . Anemia, unspecified 09/14/2009  . VITAMIN D DEFICIENCY 09/02/2009  . Hyperlipidemia 09/02/2009  . Essential hypertension 09/02/2009  . ALLERGIC RHINITIS 09/02/2009  . REFLUX ESOPHAGITIS 09/02/2009  . URINARY INCONTINENCE, 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   Saylor Sheckler, PT  Alvita Fana 12/30/2020, 10:58 AM  [facility] [address] [place], [place], [address] Phone: [phone]   Fax:  [phone]  Name: [name] MRN: [id number] Date of Birth: 1941-08-12

## 2021-01-04 DIAGNOSIS — M47812 Spondylosis without myelopathy or radiculopathy, cervical region: Secondary | ICD-10-CM | POA: Diagnosis not present

## 2021-01-04 DIAGNOSIS — M542 Cervicalgia: Secondary | ICD-10-CM | POA: Diagnosis not present

## 2021-01-04 DIAGNOSIS — M7918 Myalgia, other site: Secondary | ICD-10-CM | POA: Diagnosis not present

## 2021-01-05 ENCOUNTER — Ambulatory Visit (INDEPENDENT_AMBULATORY_CARE_PROVIDER_SITE_OTHER): Payer: Medicare PPO

## 2021-01-05 ENCOUNTER — Ambulatory Visit: Payer: Medicare PPO | Admitting: Physical Therapy

## 2021-01-05 ENCOUNTER — Other Ambulatory Visit: Payer: Self-pay

## 2021-01-05 DIAGNOSIS — Z780 Asymptomatic menopausal state: Secondary | ICD-10-CM | POA: Diagnosis not present

## 2021-01-05 DIAGNOSIS — Z0000 Encounter for general adult medical examination without abnormal findings: Secondary | ICD-10-CM | POA: Diagnosis not present

## 2021-01-05 DIAGNOSIS — R2689 Other abnormalities of gait and mobility: Secondary | ICD-10-CM | POA: Diagnosis not present

## 2021-01-05 DIAGNOSIS — M6281 Muscle weakness (generalized): Secondary | ICD-10-CM | POA: Diagnosis not present

## 2021-01-05 DIAGNOSIS — M8589 Other specified disorders of bone density and structure, multiple sites: Secondary | ICD-10-CM | POA: Diagnosis not present

## 2021-01-05 NOTE — Patient Instructions (Signed)
Access Code: [email or web address] URL: [email or web address] Date: 01/05/2021 Prepared by: Reggy Eye  Exercises Standing Hip Abduction with Counter Support - 1 x daily - 5 x weekly - 2 sets - 10 reps Standing Knee Flexion with Counter Support - 1 x daily - 5 x weekly - 2 sets - 10 reps Heel rises with counter support - 1 x daily - 5 x weekly - 2 sets - 10 reps Standing Hip Extension with Counter Support - 1 x daily - 7 x weekly - 2 sets - 10 reps Standing Tandem Balance with Counter Support - 1 x daily - 5 x weekly - 3 sets - 1 reps - 20-30 seconds hold Standing Single Leg Stance with Counter Support - 1 x daily - 7 x weekly - 3 sets - 1 reps - 20-30 seconds hold Sit to Stand - 1 x daily - 5 x weekly - 2 sets - 10 reps

## 2021-01-05 NOTE — Therapy (Signed)
[facility] [place], [place], [address] Phone: [phone]   Fax:  [phone]  Physical Therapy Treatment and Discharge  Patient Details  Name: [name] MRN: [id number] Date of Birth: [birth year] Referring Provider (PT): wagoner, Rodman Key   Encounter Date: 01/05/2021   PT End of Session - 01/05/21 1217    Visit Number 6    Number of Visits 12    Date for PT Re-Evaluation 01/26/21    PT Start Time 9449    PT Stop Time 1217    PT Time Calculation (min) 32 min    Activity Tolerance Patient tolerated treatment well    Behavior During Therapy [facility] for tasks assessed/performed           Past Medical History:  Diagnosis Date  . Arthritis    "back; left hip; knees" (12/04/2013)  . Asthma   . Chronic bronchitis ([place])    "usually get it twice/yr" (12/04/2013)  . Diabetes mellitus without complication ([place])   . Environmental and seasonal allergies   . GERD (gastroesophageal reflux disease)   . High cholesterol   . Hypertension    pcp   [facility]     . Pneumonia    as a child    Past Surgical History:  Procedure Laterality Date  . ABDOMINAL HYSTERECTOMY    . BLADDER SUSPENSION    . COLONOSCOPY    . JOINT REPLACEMENT    . SHOULDER ARTHROSCOPY W/ ROTATOR CUFF REPAIR Right   . 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   Procedure: TOTAL HIP ARTHROPLASTY;  Surgeon: Newt Minion, MD;  Location: [place];  Service: Orthopedics;  Laterality: Left;  Left Total Hip Arthroplasty  . 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   Procedure: TOTAL KNEE ARTHROPLASTY;  Surgeon: Newt Minion, MD;  Location: [place];  Service: Orthopedics;  Laterality: Right;  Right Total Knee Arthroplasty    There were no vitals filed for this visit.   Subjective Assessment - 01/05/21 1151    Subjective Pt states she feels her balance is "better than it was" States she feels comfortable to d/c  from PT    Patient Stated Goals keep my balance better and not fall    Currently in Pain? No/denies              [facility] PT Assessment - 01/05/21 0001      Transfers   Five time sit to stand comments  17.25      Berg Balance Test   Sit to Stand Able to stand without using hands and stabilize independently    Standing Unsupported Able to stand safely 2 minutes    Sitting with Back Unsupported but Feet Supported on Floor or Stool Able to sit safely and securely 2 minutes    Stand to Sit Uses backs of legs against chair to control descent    Transfers Able to transfer safely, minor use of hands    Standing Unsupported with Eyes Closed Able to stand 10 seconds safely    Standing Unsupported with Feet Together Able to place feet together independently and stand for 1 minute with supervision    From Standing, Reach Forward with Outstretched Arm Can reach forward >12 cm safely (5")    From Standing Position, Pick up Object from Floor Able to pick up shoe safely and easily    From Standing Position, Turn to Look Behind Over each Shoulder Turn sideways  only but maintains balance    Turn 360 Degrees Able to turn 360 degrees safely but slowly    Standing Unsupported, Alternately Place Feet on Step/Stool Able to complete >2 steps/needs minimal assist    Standing Unsupported, One Foot in Front Needs help to step but can hold 15 seconds    Standing on One Leg Tries to lift leg/unable to hold 3 seconds but remains standing independently    Total Score 39                         [facility] Adult PT Treatment/Exercise - 01/05/21 0001      Knee/Hip Exercises: Aerobic   Nustep L5 x 5 mins      Knee/Hip Exercises: Standing   Heel Raises Both;10 reps    Knee Flexion Both;10 reps    Hip Abduction Both;10 reps    Hip Extension Both;10 reps    SLS up to 20 sec bilat with intermittent UE support    Other Standing Knee Exercises tandem stance up to 30 sec bilat with intermittent UE support                        PT Long Term Goals - 01/05/21 1217      PT LONG TERM GOAL #1   Title Pt will be independent with HEP    Status Achieved      PT LONG TERM GOAL #2   Title Pt will improve bilat LE strength to 4/5 to improve activity tolerance    Status Achieved      PT LONG TERM GOAL #3   Title Pt will improve Berg balance score to 38/56 to demo decreased risk of falls    Baseline 39 on 01/05/21    Status Achieved      PT LONG TERM GOAL #4   Title Pt will tolerate walking x 10 minutes to be demo improved functional activity tolerance    Baseline continues to be limited by decreased mm endurance    Status Partially Met                 Plan - 01/05/21 1218    Clinical Impression Statement Pt has improved balance as noted by increase to 39/56 on Berg balance test and improvement to 17.25 seconds on 5x STS test. Pt states she feels comfortable to d/c to HEP and has demo'd understanding of HEP for balance and strength training    PT Next Visit Plan pt to d/c to HEP    PT Home Exercise Plan [email or web address]    Consulted and Agree with Plan of Care Patient           Patient will benefit from skilled therapeutic intervention in order to improve the following deficits and impairments:     Visit Diagnosis: Other abnormalities of gait and mobility  Balance disorder  Muscle weakness (generalized)     Problem List Patient Active Problem List   Diagnosis Date Noted  . DDD (degenerative disc disease), cervical 03/03/2020  . BMI 28.0-28.9,adult 05/01/2019  . Chronic kidney disease (CKD), stage III (moderate) ([place]) 03/30/2019  . Primary open angle glaucoma (POAG) of left eye, mild stage 11/05/2018  . 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 Presence of orthopedic joint implant 12/03/2013  . 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[place]) 01/18/2012  . DDD (degenerative disc  disease), lumbar  01/05/2012  . Osteopenia 07/18/2010  . Asthma in adult 07/18/2010  . Backache 11/01/2009  . Anemia, unspecified 09/14/2009  . VITAMIN D DEFICIENCY 09/02/2009  . Hyperlipidemia 09/02/2009  . Essential hypertension 09/02/2009  . ALLERGIC RHINITIS 09/02/2009  . REFLUX ESOPHAGITIS 09/02/2009  . URINARY INCONTINENCE, 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  PHYSICAL THERAPY DISCHARGE SUMMARY  Visits from Start of Care: 6  Current functional level related to goals / functional outcomes: Pt has improved balance and strength to improve activity tolerance   Remaining deficits: See above. Pt educated to always use SPC in community and uneven surfaces to reduce risk of falls   Education / Equipment: HEP Plan: Patient agrees to discharge.  Patient goals were partially met. Patient is being discharged due to being pleased with the current functional level.  ?????     Isabelle Course, PT   Malary Aylesworth 01/05/2021, 12:20 PM  [facility] [place], [place], [address] Phone: [phone]   Fax:  [phone]  Name: VALEREE LEIDY MRN: [id number] Date of Birth: Apr 30, 1941

## 2021-01-07 ENCOUNTER — Encounter: Payer: Medicare PPO | Admitting: Physical Therapy

## 2021-01-12 ENCOUNTER — Encounter: Payer: Medicare PPO | Admitting: Physical Therapy

## 2021-01-18 ENCOUNTER — Ambulatory Visit: Payer: Medicare PPO | Admitting: Family Medicine

## 2021-01-18 ENCOUNTER — Encounter: Payer: Self-pay | Admitting: Family Medicine

## 2021-01-18 ENCOUNTER — Other Ambulatory Visit: Payer: Self-pay

## 2021-01-18 VITALS — BP 117/56 | HR 70 | Ht 65.0 in | Wt 165.0 lb

## 2021-01-18 DIAGNOSIS — H6982 Other specified disorders of Eustachian tube, left ear: Secondary | ICD-10-CM | POA: Diagnosis not present

## 2021-01-18 DIAGNOSIS — H9202 Otalgia, left ear: Secondary | ICD-10-CM | POA: Diagnosis not present

## 2021-01-18 MED ORDER — PREDNISONE 20 MG PO TABS: 40.0000 mg | ORAL_TABLET | Freq: Every day | ORAL | 0 refills | Status: DC

## 2021-01-18 NOTE — Progress Notes (Signed)
Acute Office Visit  Subjective:    Patient ID: [name], female    DOB: 1940-10-05, 80 y.o.   MRN: [id number]  Chief Complaint  Patient presents with  . Ear Pain    HPI Patient is in today for left ear pain.  Getting some post nasal drip in the AM.  Pain started behind the ear and now infront of the ear and going into her neck.  No other URI sxs.   Michela Pitcher it did pop a couple times yesterday.  No ear drainage.  No fevers or chills.  She has been using Flonase regularly for the spring season.  Past Medical History:  Diagnosis Date  . Arthritis    "back; left hip; knees" (12/04/2013)  . Asthma   . Chronic bronchitis ([place])    "usually get it twice/yr" (12/04/2013)  . Diabetes mellitus without complication ([place])   . Environmental and seasonal allergies   . GERD (gastroesophageal reflux disease)   . High cholesterol   . Hypertension    pcp   [place]     . Pneumonia    as a child    Past Surgical History:  Procedure Laterality Date  . ABDOMINAL HYSTERECTOMY    . BLADDER SUSPENSION    . COLONOSCOPY    . JOINT REPLACEMENT    . SHOULDER ARTHROSCOPY W/ ROTATOR CUFF REPAIR Right   . 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   Procedure: TOTAL HIP ARTHROPLASTY;  Surgeon: Newt Minion, MD;  Location: [place];  Service: Orthopedics;  Laterality: Left;  Left Total Hip Arthroplasty  . 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   Procedure: TOTAL KNEE ARTHROPLASTY;  Surgeon: Newt Minion, MD;  Location: [place];  Service: Orthopedics;  Laterality: Right;  Right Total Knee Arthroplasty    Family History  Problem Relation Age of Onset  . Hypertension Mother   . Heart disease Mother   . Transient ischemic attack Mother   . CAD Father   . Cancer - Ovarian Sister   . Diabetes Daughter     Social History   Socioeconomic History  . Marital status: Widowed    Spouse name: Not on file  . Number of children: 4  . Years of education: 12th  Grade  . Highest education level: High school graduate  Occupational History    Comment: Retired  Tobacco Use  . Smoking status: Never Smoker  . Smokeless tobacco: Never Used  Vaping Use  . Vaping Use: Never used  Substance and Sexual Activity  . Alcohol use: No  . Drug use: No  . Sexual activity: Yes  Other Topics Concern  . Not on file  Social History Narrative   Retired. Widowed. Husband died Jan 16, 2019. Lives alone. She enjoys doing puzzles and yardwork.   Social Determinants of Health   Financial Resource Strain: Low Risk   . Difficulty of Paying Living Expenses: Not hard at all  Food Insecurity: No Food Insecurity  . Worried About Charity fundraiser in the Last Year: Never true  . Ran Out of Food in the Last Year: Never true  Transportation Needs: No Transportation Needs  . Lack of Transportation (Medical): No  . Lack of Transportation (Non-Medical): No  Physical Activity: Inactive  . Days of Exercise per Week: 0 days  . Minutes of Exercise per Session: 0 min  Stress: No Stress Concern Present  . Feeling of Stress : Not at all  Social  Connections: Moderately Integrated  . Frequency of Communication with Friends and Family: More than three times a week  . Frequency of Social Gatherings with Friends and Family: Once a week  . Attends Religious Services: More than 4 times per year  . Active Member of Clubs or Organizations: Yes  . Attends Archivist Meetings: More than 4 times per year  . Marital Status: Widowed  Intimate Partner Violence: Not At Risk  . Fear of Current or Ex-Partner: No  . Emotionally Abused: No  . Physically Abused: No  . Sexually Abused: No    Outpatient Medications Prior to Visit  Medication Sig Dispense Refill  . Accu-Chek FastClix Lancets MISC For checking blood sugars up to 3 times daily. E11.49 306 each 4  . ACCU-CHEK GUIDE test strip Test twice daily.  DX: E11.49 200 each 6  . AMBULATORY NON FORMULARY MEDICATION Medication  Name: Shingrix IM x 1. Repeat in 2-6 mo 1 Units 0  . aspirin 81 MG chewable tablet Chew 1 tablet by mouth daily.    . blood glucose meter kit and supplies Dispense based on patient and insurance preference. For testing up to twice daily. E11.49 1 each 0  . Capsaicin-Menthol 0.025-10 % GEL Apply 1 application topically at bedtime. To feet 45 g PRN  . carvedilol (COREG) 6.25 MG tablet TAKE 1 TABLET(6.25 MG) BY MOUTH TWICE DAILY WITH A MEAL 180 tablet 0  . cetirizine (ZYRTEC) 10 MG tablet Take 10 mg by mouth daily.    . clobetasol ointment (TEMOVATE) 3.21 % Apply 1 application topically 2 (two) times daily.    . dorzolamide (TRUSOPT) 2 % ophthalmic solution 1 drop 2 (two) times daily.    . fluticasone (FLONASE) 50 MCG/ACT nasal spray Place 2 sprays into both nostrils daily. (Patient taking differently: Place 2 sprays into both nostrils daily. As needed) 16 g 3  . gabapentin (NEURONTIN) 300 MG capsule TAKE 1 CAPSULE(300 MG) BY MOUTH TWICE DAILY 180 capsule 1  . hydrochlorothiazide (HYDRODIURIL) 25 MG tablet TAKE 1 TABLET(25 MG) BY MOUTH DAILY 90 tablet 1  . ipratropium-albuterol (DUONEB) 0.5-2.5 (3) MG/3ML SOLN Take 3 mLs by nebulization every 6 (six) hours as needed. 120 mL 1  . metFORMIN (GLUCOPHAGE) 1000 MG tablet TAKE 1 TABLET(1000 MG) BY MOUTH TWICE DAILY WITH A MEAL 180 tablet 1  . mineral oil-hydrophilic petrolatum (AQUAPHOR) ointment Apply topically as needed for dry skin. Apply to hands and arms 4 times a day 420 g 0  . simvastatin (ZOCOR) 80 MG tablet Take 1 tablet (80 mg total) by mouth at bedtime. 90 tablet 3  . sitaGLIPtin (JANUVIA) 25 MG tablet Take 1 tablet (25 mg total) by mouth daily. 90 tablet 1   No facility-administered medications prior to visit.    Allergies  Allergen Reactions  . Losartan Swelling    Angioedema  . Nsaids Swelling    IBU - tongue swlling.   . Levofloxacin Swelling  . Oxycodone Hcl Swelling    Lip started to swell up  . Verapamil Swelling  . Biaxin  [Clarithromycin] Other (See Comments)    Elevated heart rate  . Oxycontin [Oxycodone]     Lips swell  . Vicodin [Hydrocodone-Acetaminophen]     swelling    Review of Systems     Objective:    Physical Exam Constitutional:      Appearance: She is well-developed.  HENT:     Head: Normocephalic and atraumatic.     Right Ear: External ear normal.  Left Ear: External ear normal.     Nose: Nose normal.  Eyes:     Conjunctiva/sclera: Conjunctivae normal.     Pupils: Pupils are equal, round, and reactive to light.  Neck:     Thyroid: No thyromegaly.  Cardiovascular:     Rate and Rhythm: Normal rate and regular rhythm.     Heart sounds: Normal heart sounds.  Pulmonary:     Effort: Pulmonary effort is normal.     Breath sounds: Normal breath sounds. No wheezing.  Musculoskeletal:     Cervical back: Neck supple.  Lymphadenopathy:     Cervical: No cervical adenopathy.  Skin:    General: Skin is warm and dry.  Neurological:     Mental Status: She is alert and oriented to person, place, and time.     BP (!) 117/56   Pulse 70   Ht _0  (1.651 m)   Wt 165 lb (74.8 kg)   SpO2 98%   BMI 27.46 kg/m  Wt Readings from Last 3 Encounters:  01/18/21 165 lb (74.8 kg)  12/22/20 168 lb (76.2 kg)  09/23/20 166 lb (75.3 kg)    Health Maintenance Due  Topic Date Due  . COVID-19 Vaccine (3 - Booster for Pfizer series) 05/02/2020    There are no preventive care reminders to display for this patient.   No results found for: TSH Lab Results  Component Value Date   WBC 11.5 (H) 06/23/2020   HGB 12.0 06/23/2020   HCT 36.3 06/23/2020   MCV 85.6 06/23/2020   PLT 261 06/23/2020   Lab Results  Component Value Date   NA 140 12/22/2020   K 4.2 12/22/2020   CO2 26 12/22/2020   GLUCOSE 115 (H) 12/22/2020   BUN 23 12/22/2020   CREATININE 0.88 12/22/2020   BILITOT 0.3 06/23/2020   ALKPHOS 83 03/27/2019   AST 11 06/23/2020   ALT 7 06/23/2020   PROT 7.2 06/23/2020   ALBUMIN  3.7 09/25/2013   CALCIUM 9.7 12/22/2020   Lab Results  Component Value Date   CHOL 130 06/23/2020   Lab Results  Component Value Date   HDL 28 (L) 06/23/2020   Lab Results  Component Value Date   LDLCALC 69 06/23/2020   Lab Results  Component Value Date   TRIG 239 (H) 06/23/2020   Lab Results  Component Value Date   CHOLHDL 4.6 06/23/2020   Lab Results  Component Value Date   HGBA1C 6.9 (A) 12/22/2020       Assessment & Plan:   Problem List Items Addressed This Visit   None   Visit Diagnoses    Dysfunction of left eustachian tube    -  Primary   Left ear pain         Left ear exam is completely normal but she is having significant discomfort over the last couple of days she is already been using Flonase regularly without relief.  Recommend a trial of oral prednisone.  I did not palpate any cervical lymphadenopathy.  If she is not improving or getting worse please let us know.  Meds ordered this encounter  Medications  . predniSONE (DELTASONE) 20 MG tablet    Sig: Take 2 tablets (40 mg total) by mouth daily with breakfast.    Dispense:  10 tablet    Refill:  0     Beatrice Lecher, MD

## 2021-01-24 ENCOUNTER — Other Ambulatory Visit: Payer: Self-pay | Admitting: Family Medicine

## 2021-02-07 ENCOUNTER — Encounter: Payer: Self-pay | Admitting: Family Medicine

## 2021-02-07 ENCOUNTER — Telehealth (INDEPENDENT_AMBULATORY_CARE_PROVIDER_SITE_OTHER): Payer: Medicare PPO | Admitting: Family Medicine

## 2021-02-07 DIAGNOSIS — J0120 Acute ethmoidal sinusitis, unspecified: Secondary | ICD-10-CM

## 2021-02-07 DIAGNOSIS — Z20822 Contact with and (suspected) exposure to covid-19: Secondary | ICD-10-CM

## 2021-02-07 NOTE — Progress Notes (Signed)
Pt reports sxs began late Friday. The sxs got worse over the weekend.  Headache across her eyes. She has only been taking tylenol and delsym for the cough no color to the mucus.    She has a lot of head congestion   Denies any f/s/c/n/v/d.

## 2021-02-07 NOTE — Progress Notes (Signed)
Virtual Visit via Telephone Note  I connected with [name] on 02/07/21 at  2:40 PM EDT by telephone and verified that I am speaking with the correct person using two identifiers.   I discussed the limitations, risks, security and privacy concerns of performing an evaluation and management service by telephone and the availability of in person appointments. I also discussed with the patient that there may be a patient responsible charge related to this service. The patient expressed understanding and agreed to proceed.  Patient location: at home.   Provider loccation: In office   Subjective:    CC: sinus sxs.   HPI: Pt reports sxs began late Friday. The sxs got worse over the weekend.Headache across her eyes. She has only been taking tylenol and delsym for the cough no color to the mucus.  She has a lot of head congestion.  NO ST.  No GI sxs.  Left ear feels stopped up.  Using her Flonase. No known sick contracts.    Denies any f/s/c/n/v/d.   Past medical history, Surgical history, Family history not pertinant except as noted below, Social history, Allergies, and medications have been entered into the medical record, reviewed, and corrections made.   Review of Systems: No fevers, chills, night sweats, weight loss, chest pain, or shortness of breath.   Objective:    General: Speaking clearly in complete sentences without any shortness of breath.  Alert and oriented x3.  Normal judgment. No apparent acute distress.    Impression and Recommendations:    Acute sinusitis/first we will evaluate for COVID.  The current variant is often presenting as a sinusitis.  If negative then consider trial of antibiotics.  At this point she is only had symptoms for about 4 days.  Continue symptomatic care with Flonase, Tylenol.      I discussed the assessment and treatment plan with the patient. The patient was provided an opportunity to ask questions and all were answered. The patient  agreed with the plan and demonstrated an understanding of the instructions.  Orders Placed This Encounter  Procedures  . Novel Coronavirus, NAA ([facility])    Order Specific Question:   Is this test for diagnosis or screening    Answer:   Diagnosis of ill patient    Order Specific Question:   Symptomatic for COVID-19 as defined by CDC    Answer:   Yes    Order Specific Question:   Date of Symptom Onset    Answer:   02/04/2021    Order Specific Question:   Hospitalized for COVID-19    Answer:   No    Order Specific Question:   Admitted to ICU for COVID-19    Answer:   No    Order Specific Question:   Previously tested for COVID-19    Answer:   No    Order Specific Question:   Resident in a congregate (group) care setting    Answer:   No    Order Specific Question:   Is the patient student?    Answer:   No    Order Specific Question:   Employed in healthcare setting    Answer:   No    Order Specific Question:   Pregnant    Answer:   No    Order Specific Question:   Has patient completed COVID vaccination(s) (2 doses of Pfizer/Moderna 1 dose of [employer])    Answer:   Yes    Order Specific Question:  Has patient completed COVID Booster / 3rd dose    Answer:   No       The patient was advised to call back or seek an in-person evaluation if the symptoms worsen or if the condition fails to improve as anticipated.  I provided 20 minutes of non-face-to-face time during this encounter.   Nani Gasser, MD

## 2021-02-09 LAB — SARS-COV-2, NAA 2 DAY TAT

## 2021-02-09 LAB — NOVEL CORONAVIRUS, NAA: SARS-CoV-2, NAA: DETECTED — AB

## 2021-02-10 ENCOUNTER — Telehealth: Payer: Self-pay

## 2021-02-10 NOTE — Telephone Encounter (Signed)
Called to discuss with patient about COVID-19 symptoms and the use of one of the available treatments for those with mild to moderate Covid symptoms and at a high risk of hospitalization.  Pt appears to qualify for outpatient treatment due to co-morbid conditions and/or a member of an at-risk group in accordance with the FDA Emergency Use Authorization.    Symptom onset: 02/04/21 Headache Vaccinated: Yes Booster? No Immunocompromised? No Qualifiers: Asthma,DM NIH Criteria: Tier 1  Declines further treatment.   [name]

## 2021-02-24 ENCOUNTER — Other Ambulatory Visit: Payer: Self-pay

## 2021-02-24 ENCOUNTER — Emergency Department (INDEPENDENT_AMBULATORY_CARE_PROVIDER_SITE_OTHER): Payer: Medicare PPO

## 2021-02-24 ENCOUNTER — Emergency Department
Admission: EM | Admit: 2021-02-24 | Discharge: 2021-02-24 | Disposition: A | Discharge status: Home / Self Care | Payer: Medicare PPO | Source: Home / Self Care

## 2021-02-24 DIAGNOSIS — I700 Atherosclerosis of aorta: Secondary | ICD-10-CM | POA: Diagnosis not present

## 2021-02-24 DIAGNOSIS — S2020XA Contusion of thorax, unspecified, initial encounter: Secondary | ICD-10-CM

## 2021-02-24 DIAGNOSIS — S20211A Contusion of right front wall of thorax, initial encounter: Secondary | ICD-10-CM

## 2021-02-24 DIAGNOSIS — K8020 Calculus of gallbladder without cholecystitis without obstruction: Secondary | ICD-10-CM | POA: Diagnosis not present
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
## 2021-02-24 NOTE — Discharge Instructions (Addendum)
Advised patient may take Tylenol 1000 mg 2 times daily, as needed for chest contusion pain.
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
## 2021-03-11 ENCOUNTER — Ambulatory Visit: Payer: Medicare PPO | Admitting: Podiatry

## 2021-03-15 ENCOUNTER — Other Ambulatory Visit: Payer: Self-pay | Admitting: Family Medicine

## 2021-03-15 DIAGNOSIS — I10 Essential (primary) hypertension: Secondary | ICD-10-CM

## 2021-03-23 ENCOUNTER — Ambulatory Visit: Payer: Medicare PPO | Admitting: Family Medicine

## 2021-04-08 ENCOUNTER — Ambulatory Visit: Payer: Medicare PPO | Admitting: Podiatry

## 2021-04-14 ENCOUNTER — Encounter: Payer: Self-pay | Admitting: Family Medicine

## 2021-04-14 ENCOUNTER — Ambulatory Visit: Payer: Medicare PPO | Admitting: Family Medicine

## 2021-04-14 ENCOUNTER — Other Ambulatory Visit: Payer: Self-pay

## 2021-04-14 VITALS — BP 132/62 | HR 54 | Temp 97.5°F | Ht 65.0 in | Wt 166.0 lb

## 2021-04-14 DIAGNOSIS — Z794 Long term (current) use of insulin: Secondary | ICD-10-CM | POA: Diagnosis not present

## 2021-04-14 DIAGNOSIS — I10 Essential (primary) hypertension: Secondary | ICD-10-CM

## 2021-04-14 DIAGNOSIS — E1149 Type 2 diabetes mellitus with other diabetic neurological complication: Secondary | ICD-10-CM | POA: Diagnosis not present

## 2021-04-14 DIAGNOSIS — E1140 Type 2 diabetes mellitus with diabetic neuropathy, unspecified: Secondary | ICD-10-CM | POA: Diagnosis not present

## 2021-04-14 LAB — POCT GLYCOSYLATED HEMOGLOBIN (HGB A1C): Hemoglobin A1C: 6.7 % — AB (ref 4.0–5.6)

## 2021-04-14 NOTE — Progress Notes (Signed)
Established Patient Office Visit  Subjective:  Patient ID: [name], female    DOB: 11-21-1940  Age: 80 y.o. MRN: [id number]  CC:  Chief Complaint  Patient presents with   Diabetes    HPI [name] presents for   Hypertension- Pt denies chest pain, SOB, dizziness, or heart palpitations.  Taking meds as directed w/o problems.  Denies medication side effects.    Diabetes - no hypoglycemic events. No wounds or sores that are not healing well. No increased thirst or urination. Checking glucose at home. Taking medications as prescribed without any side effects.  She does follow with podiatry and uses a capsaicin menthol gel for her neuropathy which does seem to help she does not use it every day just more as needed.  Past Medical History:  Diagnosis Date   Arthritis    "back; left hip; knees" (12/04/2013)   Asthma    Chronic bronchitis ([place])    "usually get it twice/yr" (12/04/2013)   Diabetes mellitus without complication (HCC)    Environmental and seasonal allergies    GERD (gastroesophageal reflux disease)    High cholesterol    Hypertension    pcp   james manning      Pneumonia    as a child    Past Surgical History:  Procedure Laterality Date   ABDOMINAL HYSTERECTOMY     BLADDER SUSPENSION     COLONOSCOPY     JOINT REPLACEMENT     SHOULDER ARTHROSCOPY W/ ROTATOR CUFF REPAIR Right    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   Procedure: TOTAL HIP ARTHROPLASTY;  Surgeon: Newt Minion, MD;  Location: [place];  Service: Orthopedics;  Laterality: Left;  Left Total Hip Arthroplasty   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   Procedure: TOTAL KNEE ARTHROPLASTY;  Surgeon: Newt Minion, MD;  Location: [facility];  Service: Orthopedics;  Laterality: Right;  Right Total Knee Arthroplasty    Family History  Problem Relation Age of Onset   Hypertension Mother    Heart disease Mother    Transient ischemic attack Mother    CAD  Father    Cancer - Ovarian Sister    Diabetes Daughter     Social History   Socioeconomic History   Marital status: Widowed    Spouse name: Not on file   Number of children: 4   Years of education: 12th Grade   Highest education level: High school graduate  Occupational History    Comment: Retired  Tobacco Use   Smoking status: Never   Smokeless tobacco: Never  Vaping Use   Vaping Use: Never used  Substance and Sexual Activity   Alcohol use: No   Drug use: No   Sexual activity: Yes  Other Topics Concern   Not on file  Social History Narrative   Retired. Widowed. Husband died 01-15-19. Lives alone. She enjoys doing puzzles and yardwork.   Social Determinants of Health   Financial Resource Strain: Low Risk    Difficulty of Paying Living Expenses: Not hard at all  Food Insecurity: No Food Insecurity   Worried About Charity fundraiser in the Last Year: Never true   [place] in the Last Year: Never true  Transportation Needs: No Transportation Needs   Lack of Transportation (Medical): No   Lack of Transportation (Non-Medical): No  Physical Activity: Inactive   Days of Exercise per Week: 0 days   Minutes  of Exercise per Session: 0 min  Stress: No Stress Concern Present   Feeling of Stress : Not at all  Social Connections: Moderately Integrated   Frequency of Communication with Friends and Family: More than three times a week   Frequency of Social Gatherings with Friends and Family: Once a week   Attends Religious Services: More than 4 times per year   Active Member of [employer] or Organizations: Yes   Attends Archivist Meetings: More than 4 times per year   Marital Status: Widowed  Human resources officer Violence: Not At Risk   Fear of Current or Ex-Partner: No   Emotionally Abused: No   Physically Abused: No   Sexually Abused: No    Outpatient Medications Prior to Visit  Medication Sig Dispense Refill   Accu-Chek FastClix Lancets MISC For checking  blood sugars up to 3 times daily. E11.49 306 each 4   ACCU-CHEK GUIDE test strip Test twice daily.  DX: E11.49 200 each 6   AMBULATORY NON FORMULARY MEDICATION Medication Name: Shingrix IM x 1. Repeat in 2-6 mo 1 Units 0   aspirin 81 MG chewable tablet Chew 1 tablet by mouth daily.     blood glucose meter kit and supplies Dispense based on patient and insurance preference. For testing up to twice daily. E11.49 1 each 0   Capsaicin-Menthol 0.025-10 % GEL Apply 1 application topically at bedtime. To feet 45 g PRN   carvedilol (COREG) 6.25 MG tablet TAKE 1 TABLET(6.25 MG) BY MOUTH TWICE DAILY WITH A MEAL 180 tablet 0   cetirizine (ZYRTEC) 10 MG tablet Take 10 mg by mouth daily.     clobetasol ointment (TEMOVATE) 6.00 % Apply 1 application topically 2 (two) times daily.     dorzolamide (TRUSOPT) 2 % ophthalmic solution 1 drop 2 (two) times daily.     fluticasone (FLONASE) 50 MCG/ACT nasal spray Place 2 sprays into both nostrils daily. (Patient taking differently: Place 2 sprays into both nostrils daily. As needed) 16 g 3   gabapentin (NEURONTIN) 300 MG capsule TAKE 1 CAPSULE(300 MG) BY MOUTH TWICE DAILY 180 capsule 1   hydrochlorothiazide (HYDRODIURIL) 25 MG tablet TAKE 1 TABLET(25 MG) BY MOUTH DAILY 90 tablet 1   ipratropium-albuterol (DUONEB) 0.5-2.5 (3) MG/3ML SOLN Take 3 mLs by nebulization every 6 (six) hours as needed. 120 mL 1   metFORMIN (GLUCOPHAGE) 1000 MG tablet TAKE 1 TABLET(1000 MG) BY MOUTH TWICE DAILY WITH A MEAL 180 tablet 1   simvastatin (ZOCOR) 80 MG tablet Take 1 tablet (80 mg total) by mouth at bedtime. 90 tablet 3   sitaGLIPtin (JANUVIA) 25 MG tablet Take 1 tablet (25 mg total) by mouth daily. 90 tablet 1   mineral oil-hydrophilic petrolatum (AQUAPHOR) ointment Apply topically as needed for dry skin. Apply to hands and arms 4 times a day 420 g 0   No facility-administered medications prior to visit.    Allergies  Allergen Reactions   Losartan Swelling    Angioedema   Nsaids  Swelling    IBU - tongue swlling.    Levofloxacin Swelling   Oxycodone Hcl Swelling    Lip started to swell up   Verapamil Swelling   Biaxin [Clarithromycin] Other (See Comments)    Elevated heart rate   Oxycontin [Oxycodone]     Lips swell   Vicodin [Hydrocodone-Acetaminophen]     swelling    ROS Review of Systems    Objective:    Physical Exam Constitutional:      Appearance:  Normal appearance. She is well-developed.  HENT:     Head: Normocephalic and atraumatic.  Cardiovascular:     Rate and Rhythm: Normal rate and regular rhythm.     Heart sounds: Normal heart sounds.  Pulmonary:     Effort: Pulmonary effort is normal.     Breath sounds: Normal breath sounds.  Skin:    General: Skin is warm and dry.  Neurological:     Mental Status: She is alert and oriented to person, place, and time.  Psychiatric:        Behavior: Behavior normal.    BP 132/62   Pulse (!) 54   Temp (!) 97.5 F (36.4 C) (Oral)   Ht '5\' 5"'  (1.651 m)   Wt 166 lb (75.3 kg)   SpO2 100% Comment: on RA  BMI 27.62 kg/m  Wt Readings from Last 3 Encounters:  04/14/21 166 lb (75.3 kg)  02/24/21 169 lb (76.7 kg)  01/18/21 165 lb (74.8 kg)     Health Maintenance Due  Topic Date Due   Zoster Vaccines- Shingrix (1 of 2) Never done   COVID-19 Vaccine (3 - Booster for Pfizer series) 04/01/2020    There are no preventive care reminders to display for this patient.  No results found for: TSH Lab Results  Component Value Date   WBC 11.5 (H) 06/23/2020   HGB 12.0 06/23/2020   HCT 36.3 06/23/2020   MCV 85.6 06/23/2020   PLT 261 06/23/2020   Lab Results  Component Value Date   NA 140 12/22/2020   K 4.2 12/22/2020   CO2 26 12/22/2020   GLUCOSE 115 (H) 12/22/2020   BUN 23 12/22/2020   CREATININE 0.88 12/22/2020   BILITOT 0.3 06/23/2020   ALKPHOS 83 03/27/2019   AST 11 06/23/2020   ALT 7 06/23/2020   PROT 7.2 06/23/2020   ALBUMIN 3.7 09/25/2013   CALCIUM 9.7 12/22/2020   Lab Results   Component Value Date   CHOL 130 06/23/2020   Lab Results  Component Value Date   HDL 28 (L) 06/23/2020   Lab Results  Component Value Date   LDLCALC 69 06/23/2020   Lab Results  Component Value Date   TRIG 239 (H) 06/23/2020   Lab Results  Component Value Date   CHOLHDL 4.6 06/23/2020   Lab Results  Component Value Date   HGBA1C 6.7 (A) 04/14/2021      Assessment & Plan:   Problem List Items Addressed This Visit       Cardiovascular and Mediastinum   Essential hypertension - Primary    Well controlled. Continue current regimen. 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 Continue current regimen. Follow up in  3-4 mo. he does have peripheral neuropathy and works with podiatry on this.  He has been walking more regularly.  He is not interested in doing mammograms any further.  Lab Results  Component Value Date   HGBA1C 6.7 (A) 04/14/2021         Relevant Orders   POCT glycosylated hemoglobin (Hb A1C) (Completed)   Type 2 diabetes mellitus with diabetic neuropathy, unspecified (HCC)    Continue gabapentin.        Encouraged her to get the shingles vaccine.  No orders of the defined types were placed in this encounter.   Follow-up: Return in about 3 months (around 07/15/2021) for Diabetes  follow-up.       Beatrice Lecher, MD

## 2021-04-15 DIAGNOSIS — E1140 Type 2 diabetes mellitus with diabetic neuropathy, unspecified: Secondary | ICD-10-CM | POA: Insufficient documentation

## 2021-04-15 NOTE — Assessment & Plan Note (Signed)
Continue gabapentin.

## 2021-04-15 NOTE — Assessment & Plan Note (Addendum)
Well controlled. Continue current regimen. Follow up in  3-4 mo. he does have peripheral neuropathy and works with podiatry on this.  He has been walking more regularly.  He is not interested in doing mammograms any further.  Lab Results  Component Value Date   HGBA1C 6.7 (A) 04/14/2021

## 2021-04-15 NOTE — Assessment & Plan Note (Signed)
Well controlled. Continue current regimen. Follow up in  6 mo  

## 2021-04-29 ENCOUNTER — Ambulatory Visit: Payer: Medicare PPO | Admitting: Podiatry

## 2021-04-29 ENCOUNTER — Encounter: Payer: Self-pay | Admitting: Podiatry

## 2021-04-29 ENCOUNTER — Other Ambulatory Visit: Payer: Self-pay

## 2021-04-29 DIAGNOSIS — M79675 Pain in left toe(s): Secondary | ICD-10-CM

## 2021-04-29 DIAGNOSIS — E1149 Type 2 diabetes mellitus with other diabetic neurological complication: Secondary | ICD-10-CM | POA: Diagnosis not present

## 2021-04-29 DIAGNOSIS — B351 Tinea unguium: Secondary | ICD-10-CM

## 2021-04-29 DIAGNOSIS — M79674 Pain in right toe(s): Secondary | ICD-10-CM

## 2021-04-29 NOTE — Progress Notes (Signed)
Subjective: 80 y.o. returns the office today for painful, elongated, thickened toenails which she cannot trim herself. Denies any redness or drainage around the nails. She is still on gabapentin twice a day which helps some. She has been on this same dose for about 8 years she reports. She is not using any creams on her feet right now.  She did go to physical therapy some for gait instability and this was somewhat helpful.  Denies any acute changes since last appointment and no new complaints today. Denies any systemic complaints such as fevers, chills, nausea, vomiting.   PCP: Agapito Games, MD Last Seen: 04/14/2021  A1c: 6.7 on 7/28/222  Objective: AAO 3, NAD DP/PT pulses palpable, CRT less than 3 seconds Nails hypertrophic, dystrophic, elongated, brittle, discolored 10. There is tenderness overlying the nails 1-5 bilaterally. There is no surrounding erythema or drainage along the nail sites. No area pinpoint tenderness.  No edema, erythema.  No other areas of discomfort identified on today's exam. No open lesions or pre-ulcerative lesions are identified. No pain with calf compression, swelling, warmth, erythema.  Assessment: Patient presents with symptomatic onychomycosis; neuropathy   Plan: -Treatment options including alternatives, risks, complications were discussed -Nails sharply debrided 10 without complication/bleeding. -Continue gabapentin.  Discussed topical medications as well to help with neuropathy aches, foot pain.  Already continue same dose of gabapentin for now. -Discussed daily foot inspection. If there are any changes, to call the office immediately.  -Follow-up in 3 months or sooner if any problems are to arise. In the meantime, encouraged to call the office with any questions, concerns, changes symptoms.  Ovid Curd, DPM

## 2021-04-29 NOTE — Patient Instructions (Addendum)
For creams on the feet you can use Aspercreme with lidocaine or capsaicin cream.

## 2021-05-03 ENCOUNTER — Other Ambulatory Visit: Payer: Self-pay | Admitting: Family Medicine

## 2021-05-13 DIAGNOSIS — M1611 Unilateral primary osteoarthritis, right hip: Secondary | ICD-10-CM | POA: Diagnosis not present

## 2021-05-13 DIAGNOSIS — M25551 Pain in right hip: Secondary | ICD-10-CM | POA: Diagnosis not present

## 2021-05-13 DIAGNOSIS — Z96641 Presence of right artificial hip joint: Secondary | ICD-10-CM | POA: Diagnosis not present

## 2021-05-13 DIAGNOSIS — M5416 Radiculopathy, lumbar region: Secondary | ICD-10-CM | POA: Diagnosis not present

## 2021-05-13 DIAGNOSIS — M25751 Osteophyte, right hip: Secondary | ICD-10-CM | POA: Diagnosis not present

## 2021-05-14 ENCOUNTER — Other Ambulatory Visit: Payer: Self-pay | Admitting: Nurse Practitioner

## 2021-05-14 DIAGNOSIS — J0110 Acute frontal sinusitis, unspecified: Secondary | ICD-10-CM

## 2021-06-02 DIAGNOSIS — M5416 Radiculopathy, lumbar region: Secondary | ICD-10-CM | POA: Diagnosis not present

## 2021-06-02 DIAGNOSIS — M1611 Unilateral primary osteoarthritis, right hip: Secondary | ICD-10-CM | POA: Diagnosis not present

## 2021-06-10 DIAGNOSIS — M5416 Radiculopathy, lumbar region: Secondary | ICD-10-CM | POA: Diagnosis not present

## 2021-06-10 DIAGNOSIS — M545 Low back pain, unspecified: Secondary | ICD-10-CM | POA: Diagnosis not present

## 2021-06-10 DIAGNOSIS — G8929 Other chronic pain: Secondary | ICD-10-CM | POA: Diagnosis not present

## 2021-06-11 ENCOUNTER — Other Ambulatory Visit: Payer: Self-pay | Admitting: Family Medicine

## 2021-06-11 DIAGNOSIS — I10 Essential (primary) hypertension: Secondary | ICD-10-CM

## 2021-06-13 ENCOUNTER — Other Ambulatory Visit: Payer: Self-pay | Admitting: *Deleted

## 2021-06-13 DIAGNOSIS — E1149 Type 2 diabetes mellitus with other diabetic neurological complication: Secondary | ICD-10-CM

## 2021-06-13 MED ORDER — METFORMIN HCL 1000 MG PO TABS: ORAL_TABLET | ORAL | 1 refills | Status: DC

## 2021-06-14 DIAGNOSIS — M1611 Unilateral primary osteoarthritis, right hip: Secondary | ICD-10-CM | POA: Diagnosis not present

## 2021-06-14 DIAGNOSIS — M5416 Radiculopathy, lumbar region: Secondary | ICD-10-CM | POA: Diagnosis not present

## 2021-06-16 DIAGNOSIS — M5416 Radiculopathy, lumbar region: Secondary | ICD-10-CM | POA: Diagnosis not present

## 2021-06-16 DIAGNOSIS — M1611 Unilateral primary osteoarthritis, right hip: Secondary | ICD-10-CM | POA: Diagnosis not present

## 2021-06-21 DIAGNOSIS — M25551 Pain in right hip: Secondary | ICD-10-CM | POA: Diagnosis not present

## 2021-06-21 DIAGNOSIS — M5459 Other low back pain: Secondary | ICD-10-CM | POA: Diagnosis not present

## 2021-06-23 DIAGNOSIS — M25551 Pain in right hip: Secondary | ICD-10-CM | POA: Diagnosis not present

## 2021-06-23 DIAGNOSIS — M5459 Other low back pain: Secondary | ICD-10-CM | POA: Diagnosis not present

## 2021-06-30 DIAGNOSIS — M5459 Other low back pain: Secondary | ICD-10-CM | POA: Diagnosis not present

## 2021-06-30 DIAGNOSIS — M25551 Pain in right hip: Secondary | ICD-10-CM | POA: Diagnosis not present

## 2021-07-05 DIAGNOSIS — M5459 Other low back pain: Secondary | ICD-10-CM | POA: Diagnosis not present

## 2021-07-05 DIAGNOSIS — M25551 Pain in right hip: Secondary | ICD-10-CM | POA: Diagnosis not present

## 2021-07-14 ENCOUNTER — Ambulatory Visit: Payer: Medicare PPO | Admitting: Family Medicine

## 2021-07-14 ENCOUNTER — Encounter: Payer: Self-pay | Admitting: Family Medicine

## 2021-07-14 VITALS — BP 137/65 | HR 51 | Ht 65.0 in | Wt 169.0 lb

## 2021-07-14 DIAGNOSIS — Z23 Encounter for immunization: Secondary | ICD-10-CM | POA: Diagnosis not present

## 2021-07-14 DIAGNOSIS — E1149 Type 2 diabetes mellitus with other diabetic neurological complication: Secondary | ICD-10-CM

## 2021-07-14 DIAGNOSIS — D649 Anemia, unspecified: Secondary | ICD-10-CM | POA: Diagnosis not present

## 2021-07-14 DIAGNOSIS — E785 Hyperlipidemia, unspecified: Secondary | ICD-10-CM | POA: Diagnosis not present

## 2021-07-14 DIAGNOSIS — I10 Essential (primary) hypertension: Secondary | ICD-10-CM | POA: Diagnosis not present

## 2021-07-14 LAB — POCT GLYCOSYLATED HEMOGLOBIN (HGB A1C): Hemoglobin A1C: 7.5 % — AB (ref 4.0–5.6)

## 2021-07-14 MED ORDER — SIMVASTATIN 80 MG PO TABS: 80.0000 mg | ORAL_TABLET | Freq: Every day | ORAL | 3 refills | Status: DC

## 2021-07-14 MED ORDER — EMPAGLIFLOZIN 10 MG PO TABS: 10.0000 mg | ORAL_TABLET | Freq: Every day | ORAL | 0 refills | Status: DC

## 2021-07-14 NOTE — Assessment & Plan Note (Signed)
A1C went up to 7.5 today.  Not well controlled.  It was much better at 6.7 this summer.  She says that she really has not changed her diet or exercise but noted that her weight is up about 3 pounds.  We just discussed making sure she is really cutting back on carbs and sweets and really portion controlling.  I think this would be helpful otherwise I am not sure what is changed she says she still taking her medications.  I think at this point organ to try switching the Januvia to [place] instead and continue with the metformin and see her back in 3 months.

## 2021-07-14 NOTE — Assessment & Plan Note (Signed)
Repeat blood pressure looks much better today we will continue to follow.  She does check it at home and typically gets good numbers at home

## 2021-07-14 NOTE — Progress Notes (Signed)
Established Patient Office Visit  Subjective:  Patient ID: [name], female    DOB: 09/19/1940  Age: 80 y.o. MRN: [id number]  CC:  Chief Complaint  Patient presents with   Diabetes   Hypertension    HPI [name] presents for   Diabetes - no hypoglycemic events. No wounds or sores that are not healing well. No increased thirst or urination. Checking glucose at home. Taking medications as prescribed without any side effects. Wt is up 3 lbs.   Hypertension- Pt denies chest pain, SOB, dizziness, or heart palpitations.  Taking meds as directed w/o problems.  Denies medication side effects.  She reports that her home blood pressures are well controlled.    Past Medical History:  Diagnosis Date   Arthritis    "back; left hip; knees" (12/04/2013)   Asthma    Chronic bronchitis ([place])    "usually get it twice/yr" (12/04/2013)   Diabetes mellitus without complication (HCC)    Environmental and seasonal allergies    GERD (gastroesophageal reflux disease)    High cholesterol    Hypertension    pcp   james manning      Pneumonia    as a child    Past Surgical History:  Procedure Laterality Date   ABDOMINAL HYSTERECTOMY     BLADDER SUSPENSION     COLONOSCOPY     JOINT REPLACEMENT     SHOULDER ARTHROSCOPY W/ ROTATOR CUFF REPAIR Right    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   Procedure: TOTAL HIP ARTHROPLASTY;  Surgeon: Newt Minion, MD;  Location: [facility];  Service: Orthopedics;  Laterality: Left;  Left Total Hip Arthroplasty   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   Procedure: TOTAL KNEE ARTHROPLASTY;  Surgeon: Newt Minion, MD;  Location: [place];  Service: Orthopedics;  Laterality: Right;  Right Total Knee Arthroplasty    Family History  Problem Relation Age of Onset   Hypertension Mother    Heart disease Mother    Transient ischemic attack Mother    CAD Father    Cancer - Ovarian Sister    Diabetes Daughter       Outpatient Medications Prior to Visit  Medication Sig Dispense Refill   Accu-Chek FastClix Lancets MISC For checking blood sugars up to 3 times daily. E11.49 306 each 4   ACCU-CHEK GUIDE test strip Test twice daily.  DX: E11.49 200 each 6   AMBULATORY NON FORMULARY MEDICATION Medication Name: Shingrix IM x 1. Repeat in 2-6 mo 1 Units 0   aspirin 81 MG chewable tablet Chew 1 tablet by mouth daily.     blood glucose meter kit and supplies Dispense based on patient and insurance preference. For testing up to twice daily. E11.49 1 each 0   Capsaicin-Menthol 0.025-10 % GEL Apply 1 application topically at bedtime. To feet 45 g PRN   carvedilol (COREG) 6.25 MG tablet TAKE 1 TABLET(6.25 MG) BY MOUTH TWICE DAILY WITH A MEAL 180 tablet 0   cetirizine (ZYRTEC) 10 MG tablet Take 10 mg by mouth daily.     clobetasol ointment (TEMOVATE) 6.21 % Apply 1 application topically 2 (two) times daily.     dorzolamide (TRUSOPT) 2 % ophthalmic solution 1 drop 2 (two) times daily.     fluticasone (FLONASE) 50 MCG/ACT nasal spray SHAKE LIQUID AND USE 2 SPRAYS IN EACH NOSTRIL DAILY 16 g 3   gabapentin (NEURONTIN) 300 MG capsule TAKE 1 CAPSULE(300 MG) BY  MOUTH TWICE DAILY 180 capsule 1   hydrochlorothiazide (HYDRODIURIL) 25 MG tablet TAKE 1 TABLET(25 MG) BY MOUTH DAILY 90 tablet 1   ipratropium-albuterol (DUONEB) 0.5-2.5 (3) MG/3ML SOLN Take 3 mLs by nebulization every 6 (six) hours as needed. 120 mL 1   metFORMIN (GLUCOPHAGE) 1000 MG tablet TAKE 1 TABLET(1000 MG) BY MOUTH TWICE DAILY WITH A MEAL 180 tablet 1   simvastatin (ZOCOR) 80 MG tablet Take 1 tablet (80 mg total) by mouth at bedtime. 90 tablet 3   sitaGLIPtin (JANUVIA) 25 MG tablet Take 1 tablet (25 mg total) by mouth daily. 90 tablet 1   No facility-administered medications prior to visit.    Allergies  Allergen Reactions   Losartan Swelling    Angioedema   Nsaids Swelling    IBU - tongue swlling.    Levofloxacin Swelling   Oxycodone Hcl Swelling     Lip started to swell up   Verapamil Swelling   Biaxin [Clarithromycin] Other (See Comments)    Elevated heart rate   Oxycontin [Oxycodone]     Lips swell   Vicodin [Hydrocodone-Acetaminophen]     swelling    ROS Review of Systems    Objective:    Physical Exam Constitutional:      Appearance: Normal appearance. She is well-developed.  HENT:     Head: Normocephalic and atraumatic.  Cardiovascular:     Rate and Rhythm: Normal rate and regular rhythm.     Heart sounds: Normal heart sounds.  Pulmonary:     Effort: Pulmonary effort is normal.     Breath sounds: Normal breath sounds.  Skin:    General: Skin is warm and dry.  Neurological:     Mental Status: She is alert and oriented to person, place, and time.  Psychiatric:        Behavior: Behavior normal.    BP 137/65   Pulse (!) 51   Ht '5\' 5"'  (1.651 m)   Wt 169 lb (76.7 kg)   SpO2 96%   BMI 28.12 kg/m  Wt Readings from Last 3 Encounters:  07/14/21 169 lb (76.7 kg)  04/14/21 166 lb (75.3 kg)  02/24/21 169 lb (76.7 kg)     Health Maintenance Due  Topic Date Due   Zoster Vaccines- Shingrix (1 of 2) Never done   Pneumonia Vaccine 39+ Years old (2 - PPSV23 if available, else PCV20) 07/28/2015   OPHTHALMOLOGY EXAM  07/12/2021    There are no preventive care reminders to display for this patient.  No results found for: TSH Lab Results  Component Value Date   WBC 11.5 (H) 06/23/2020   HGB 12.0 06/23/2020   HCT 36.3 06/23/2020   MCV 85.6 06/23/2020   PLT 261 06/23/2020   Lab Results  Component Value Date   NA 140 12/22/2020   K 4.2 12/22/2020   CO2 26 12/22/2020   GLUCOSE 115 (H) 12/22/2020   BUN 23 12/22/2020   CREATININE 0.88 12/22/2020   BILITOT 0.3 06/23/2020   ALKPHOS 83 03/27/2019   AST 11 06/23/2020   ALT 7 06/23/2020   PROT 7.2 06/23/2020   ALBUMIN 3.7 09/25/2013   CALCIUM 9.7 12/22/2020   Lab Results  Component Value Date   CHOL 130 06/23/2020   Lab Results  Component Value Date    HDL 28 (L) 06/23/2020   Lab Results  Component Value Date   LDLCALC 69 06/23/2020   Lab Results  Component Value Date   TRIG 239 (H) 06/23/2020   Lab  Results  Component Value Date   CHOLHDL 4.6 06/23/2020   Lab Results  Component Value Date   HGBA1C 7.5 (A) 07/14/2021      Assessment & Plan:   Problem List Items Addressed This Visit       Cardiovascular and Mediastinum   Essential hypertension    Repeat blood pressure looks much better today we will continue to follow.  She does check it at home and typically gets good numbers at home      Relevant Medications   simvastatin (ZOCOR) 80 MG tablet   Other Relevant Orders   POCT glycosylated hemoglobin (Hb A1C) (Completed)   Lipid panel   COMPLETE METABOLIC PANEL WITH GFR     Endocrine   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) - Primary    A1C went up to 7.5 today.  Not well controlled.  It was much better at 6.7 this summer.  She says that she really has not changed her diet or exercise but noted that her weight is up about 3 pounds.  We just discussed making sure she is really cutting back on carbs and sweets and really portion controlling.  I think this would be helpful otherwise I am not sure what is changed she says she still taking her medications.  I think at this point organ to try switching the Januvia to [place] instead and continue with the metformin and see her back in 3 months.      Relevant Medications   simvastatin (ZOCOR) 80 MG tablet   empagliflozin (JARDIANCE) 10 MG TABS tablet   Other Relevant Orders   POCT glycosylated hemoglobin (Hb A1C) (Completed)   Lipid panel   COMPLETE METABOLIC PANEL WITH GFR     Other   Anemia, unspecified   Relevant Orders   CBC   Hyperlipidemia   Relevant Medications   simvastatin (ZOCOR) 80 MG tablet   Other Relevant Orders   POCT glycosylated hemoglobin (Hb A1C) (Completed)   Lipid panel   COMPLETE METABOLIC PANEL WITH GFR   Other  Visit Diagnoses     Need for immunization against influenza       Relevant Orders   Flu Vaccine QUAD High Dose(Fluad) (Completed)       Meds ordered this encounter  Medications   simvastatin (ZOCOR) 80 MG tablet    Sig: Take 1 tablet (80 mg total) by mouth at bedtime.    Dispense:  90 tablet    Refill:  3   empagliflozin (JARDIANCE) 10 MG TABS tablet    Sig: Take 1 tablet (10 mg total) by mouth daily before breakfast.    Dispense:  90 tablet    Refill:  0     Follow-up: No follow-ups on file.    Beatrice Lecher, MD

## 2021-07-15 LAB — COMPLETE METABOLIC PANEL WITH GFR
AG Ratio: 1.6 (calc) (ref 1.0–2.5)
ALT: 7 U/L (ref 6–29)
AST: 11 U/L (ref 10–35)
Albumin: 4.3 g/dL (ref 3.6–5.1)
Alkaline phosphatase (APISO): 80 U/L (ref 37–153)
BUN: 17 mg/dL (ref 7–25)
CO2: 26 mmol/L (ref 20–32)
Calcium: 9.9 mg/dL (ref 8.6–10.4)
Chloride: 102 mmol/L (ref 98–110)
Creat: 0.94 mg/dL (ref 0.60–0.95)
Globulin: 2.7 g/dL (calc) (ref 1.9–3.7)
Glucose, Bld: 132 mg/dL — ABNORMAL HIGH (ref 65–99)
Potassium: 4.8 mmol/L (ref 3.5–5.3)
Sodium: 142 mmol/L (ref 135–146)
Total Bilirubin: 0.5 mg/dL (ref 0.2–1.2)
Total Protein: 7 g/dL (ref 6.1–8.1)
eGFR: 61 mL/min/{1.73_m2} (ref >=60)

## 2021-07-15 LAB — LIPID PANEL
Cholesterol: 123 mg/dL (ref <200)
HDL: 30 mg/dL — ABNORMAL LOW (ref >=50)
LDL Cholesterol (Calc): 65 mg/dL (calc)
Non-HDL Cholesterol (Calc): 93 mg/dL (calc) (ref <130)
Total CHOL/HDL Ratio: 4.1 (calc) (ref <5.0)
Triglycerides: 215 mg/dL — ABNORMAL HIGH (ref <150)

## 2021-07-15 LAB — CBC
HCT: 36.8 % (ref 35.0–45.0)
Hemoglobin: 12.5 g/dL (ref 11.7–15.5)
MCH: 29.3 pg (ref 27.0–33.0)
MCHC: 34 g/dL (ref 32.0–36.0)
MCV: 86.2 fL (ref 80.0–100.0)
MPV: 11 fL (ref 7.5–12.5)
Platelets: 331 10*3/uL (ref 140–400)
RBC: 4.27 10*6/uL (ref 3.80–5.10)
RDW: 13.5 % (ref 11.0–15.0)
WBC: 9.2 10*3/uL (ref 3.8–10.8)

## 2021-07-15 NOTE — Progress Notes (Signed)
Call pt: Triglycerides look a little better this time . Good job! Your blood count and metabolic panel are OK as well.

## 2021-08-05 ENCOUNTER — Ambulatory Visit: Payer: Medicare PPO | Admitting: Podiatry

## 2021-08-05 ENCOUNTER — Encounter: Payer: Self-pay | Admitting: Podiatry

## 2021-08-05 ENCOUNTER — Other Ambulatory Visit: Payer: Self-pay

## 2021-08-05 DIAGNOSIS — B351 Tinea unguium: Secondary | ICD-10-CM

## 2021-08-05 DIAGNOSIS — E1149 Type 2 diabetes mellitus with other diabetic neurological complication: Secondary | ICD-10-CM | POA: Diagnosis not present

## 2021-08-05 DIAGNOSIS — M79674 Pain in right toe(s): Secondary | ICD-10-CM

## 2021-08-05 DIAGNOSIS — M79675 Pain in left toe(s): Secondary | ICD-10-CM | POA: Diagnosis not present

## 2021-08-05 NOTE — Progress Notes (Signed)
Subjective: 80 y.o. returns the office today for painful, elongated, thickened toenails which she cannot trim herself. Denies any redness or drainage around the nails. She is still on gabapentin twice a day which helps some.  Denies any acute changes since last appointment and no new complaints today. Denies any systemic complaints such as fevers, chills, nausea, vomiting.   PCP: Agapito Games, MD Last Seen: 07/14/21  A1c: 7.5 on 07/14/21  Objective: AAO 3, NAD DP/PT pulses palpable, CRT less than 3 seconds Nails hypertrophic, dystrophic, elongated, brittle, discolored 10. There is tenderness overlying the nails 1-5 bilaterally. There is no surrounding erythema or drainage along the nail sites. No area pinpoint tenderness.  No edema, erythema.  No other areas of discomfort identified on today's exam. No open lesions or pre-ulcerative lesions are identified. No pain with calf compression, swelling, warmth, erythema.  Assessment: Patient presents with symptomatic onychomycosis; neuropathy   Plan: -Treatment options including alternatives, risks, complications were discussed -Nails sharply debrided 10 without complication/bleeding. -Continue gabapentin.  . -Discussed daily foot inspection. If there are any changes, to call the office immediately.  -Follow-up in 3 months or sooner if any problems are to arise. In the meantime, encouraged to call the office with any questions, concerns, changes symptoms.  Louann Sjogren, DPM

## 2021-09-10 ENCOUNTER — Other Ambulatory Visit: Payer: Self-pay | Admitting: Family Medicine

## 2021-09-10 DIAGNOSIS — I10 Essential (primary) hypertension: Secondary | ICD-10-CM

## 2021-09-13 ENCOUNTER — Other Ambulatory Visit: Payer: Self-pay | Admitting: Family Medicine

## 2021-09-19 DIAGNOSIS — H5203 Hypermetropia, bilateral: Secondary | ICD-10-CM | POA: Diagnosis not present

## 2021-09-19 DIAGNOSIS — E119 Type 2 diabetes mellitus without complications: Secondary | ICD-10-CM | POA: Diagnosis not present

## 2021-09-19 DIAGNOSIS — H524 Presbyopia: Secondary | ICD-10-CM | POA: Diagnosis not present

## 2021-09-19 DIAGNOSIS — H401133 Primary open-angle glaucoma, bilateral, severe stage: Secondary | ICD-10-CM | POA: Diagnosis not present

## 2021-09-19 LAB — HM DIABETES EYE EXAM

## 2021-10-13 ENCOUNTER — Other Ambulatory Visit: Payer: Self-pay | Admitting: Family Medicine

## 2021-10-13 DIAGNOSIS — E1149 Type 2 diabetes mellitus with other diabetic neurological complication: Secondary | ICD-10-CM

## 2021-10-17 ENCOUNTER — Other Ambulatory Visit: Payer: Self-pay

## 2021-10-17 ENCOUNTER — Encounter: Payer: Self-pay | Admitting: Family Medicine

## 2021-10-17 ENCOUNTER — Ambulatory Visit: Payer: Medicare PPO | Admitting: Family Medicine

## 2021-10-17 VITALS — BP 144/75 | HR 94 | Resp 16 | Ht 65.0 in | Wt 162.0 lb

## 2021-10-17 DIAGNOSIS — I10 Essential (primary) hypertension: Secondary | ICD-10-CM | POA: Diagnosis not present

## 2021-10-17 DIAGNOSIS — Z6826 Body mass index (BMI) 26.0-26.9, adult: Secondary | ICD-10-CM | POA: Diagnosis not present

## 2021-10-17 DIAGNOSIS — N1831 Chronic kidney disease, stage 3a: Secondary | ICD-10-CM | POA: Diagnosis not present

## 2021-10-17 DIAGNOSIS — E1140 Type 2 diabetes mellitus with diabetic neuropathy, unspecified: Secondary | ICD-10-CM

## 2021-10-17 DIAGNOSIS — E1149 Type 2 diabetes mellitus with other diabetic neurological complication: Secondary | ICD-10-CM

## 2021-10-17 DIAGNOSIS — Z794 Long term (current) use of insulin: Secondary | ICD-10-CM

## 2021-10-17 LAB — POCT UA - MICROALBUMIN
Creatinine, POC: 100 mg/dL
Microalbumin Ur, POC: 80 mg/L

## 2021-10-17 LAB — POCT GLYCOSYLATED HEMOGLOBIN (HGB A1C): Hemoglobin A1C: 6.5 % — AB (ref 4.0–5.6)

## 2021-10-17 MED ORDER — ACCU-CHEK GUIDE VI STRP: ORAL_STRIP | 6 refills | Status: DC

## 2021-10-17 MED ORDER — EMPAGLIFLOZIN 10 MG PO TABS: ORAL_TABLET | ORAL | 1 refills | Status: DC

## 2021-10-17 NOTE — Assessment & Plan Note (Signed)
For urine microalbumin today.

## 2021-10-17 NOTE — Assessment & Plan Note (Signed)
BMI is now down to 26 which is fantastic.

## 2021-10-17 NOTE — Assessment & Plan Note (Signed)
Continue gabapentin.

## 2021-10-17 NOTE — Progress Notes (Signed)
Established Patient Office Visit  Subjective:  Patient ID: [name], female    DOB: 01-Jun-1941  Age: 81 y.o. MRN: [id number]  CC:  Chief Complaint  Patient presents with   Diabetes    Follow up     HPI [name] presents for   Diabetes - no hypoglycemic events. No wounds or sores that are not healing well. No increased thirst or urination. Checking glucose at home. Taking medications as prescribed without any side effects.  Hypertension- Pt denies chest pain, SOB, dizziness, or heart palpitations.  Taking meds as directed w/o problems.  Denies medication side effects.    She also request that we fill out a handicap placard for her.  She was using her husband's until he passed away recently.  She does have arthritis particularly in her hip and her knee that sometimes make it more difficult for her to walk  Past Medical History:  Diagnosis Date   Arthritis    "back; left hip; knees" (12/04/2013)   Asthma    Chronic bronchitis ([place])    "usually get it twice/yr" (12/04/2013)   Diabetes mellitus without complication (HCC)    Environmental and seasonal allergies    GERD (gastroesophageal reflux disease)    High cholesterol    Hypertension    pcp   james manning      Pneumonia    as a child    Past Surgical History:  Procedure Laterality Date   ABDOMINAL HYSTERECTOMY     BLADDER SUSPENSION     COLONOSCOPY     JOINT REPLACEMENT     SHOULDER ARTHROSCOPY W/ ROTATOR CUFF REPAIR Right    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   Procedure: TOTAL HIP ARTHROPLASTY;  Surgeon: Newt Minion, MD;  Location: [facility];  Service: Orthopedics;  Laterality: Left;  Left Total Hip Arthroplasty   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   Procedure: TOTAL KNEE ARTHROPLASTY;  Surgeon: Newt Minion, MD;  Location: [facility];  Service: Orthopedics;  Laterality: Right;  Right Total Knee Arthroplasty    Family History  Problem Relation Age of Onset    Hypertension Mother    Heart disease Mother    Transient ischemic attack Mother    CAD Father    Cancer - Ovarian Sister    Diabetes Daughter     Social History   Socioeconomic History   Marital status: Widowed    Spouse name: Not on file   Number of children: 4   Years of education: 12th Grade   Highest education level: High school graduate  Occupational History    Comment: Retired  Tobacco Use   Smoking status: Never   Smokeless tobacco: Never  Vaping Use   Vaping Use: Never used  Substance and Sexual Activity   Alcohol use: No   Drug use: No   Sexual activity: Yes  Other Topics Concern   Not on file  Social History Narrative   Retired. Widowed. Husband died 12/31/2018. Lives alone. She enjoys doing puzzles and yardwork.   Social Determinants of Health   Financial Resource Strain: Low Risk    Difficulty of Paying Living Expenses: Not hard at all  Food Insecurity: No Food Insecurity   Worried About Charity fundraiser in the Last Year: Never true   [place] in the Last Year: Never true  Transportation Needs: No Transportation Needs   Lack of Transportation (Medical): No  Lack of Transportation (Non-Medical): No  Physical Activity: Inactive   Days of Exercise per Week: 0 days   Minutes of Exercise per Session: 0 min  Stress: No Stress Concern Present   Feeling of Stress : Not at all  Social Connections: Moderately Integrated   Frequency of Communication with Friends and Family: More than three times a week   Frequency of Social Gatherings with Friends and Family: Once a week   Attends Religious Services: More than 4 times per year   Active Member of [employer] or Organizations: Yes   Attends Archivist Meetings: More than 4 times per year   Marital Status: Widowed  Human resources officer Violence: Not At Risk   Fear of Current or Ex-Partner: No   Emotionally Abused: No   Physically Abused: No   Sexually Abused: No    Outpatient Medications Prior  to Visit  Medication Sig Dispense Refill   Accu-Chek FastClix Lancets MISC For checking blood sugars up to 3 times daily. E11.49 306 each 4   aspirin 81 MG chewable tablet Chew 1 tablet by mouth daily.     blood glucose meter kit and supplies Dispense based on patient and insurance preference. For testing up to twice daily. E11.49 1 each 0   Capsaicin-Menthol 0.025-10 % GEL Apply 1 application topically at bedtime. To feet 45 g PRN   carvedilol (COREG) 6.25 MG tablet TAKE 1 TABLET(6.25 MG) BY MOUTH TWICE DAILY WITH A MEAL 180 tablet 0   cetirizine (ZYRTEC) 10 MG tablet Take 10 mg by mouth daily.     clobetasol ointment (TEMOVATE) 4.94 % Apply 1 application topically 2 (two) times daily.     dorzolamide (TRUSOPT) 2 % ophthalmic solution 1 drop 2 (two) times daily.     fluticasone (FLONASE) 50 MCG/ACT nasal spray SHAKE LIQUID AND USE 2 SPRAYS IN EACH NOSTRIL DAILY 16 g 3   gabapentin (NEURONTIN) 300 MG capsule TAKE 1 CAPSULE(300 MG) BY MOUTH TWICE DAILY 180 capsule 1   hydrochlorothiazide (HYDRODIURIL) 25 MG tablet TAKE 1 TABLET(25 MG) BY MOUTH DAILY 90 tablet 1   ipratropium-albuterol (DUONEB) 0.5-2.5 (3) MG/3ML SOLN Take 3 mLs by nebulization every 6 (six) hours as needed. 120 mL 1   metFORMIN (GLUCOPHAGE) 1000 MG tablet TAKE 1 TABLET(1000 MG) BY MOUTH TWICE DAILY WITH A MEAL 180 tablet 1   simvastatin (ZOCOR) 80 MG tablet Take 1 tablet (80 mg total) by mouth at bedtime. 90 tablet 3   ACCU-CHEK GUIDE test strip Test twice daily.  DX: E11.49 200 each 6   JARDIANCE 10 MG TABS tablet TAKE 1 TABLET(10 MG) BY MOUTH DAILY BEFORE BREAKFAST 90 tablet 0   AMBULATORY NON FORMULARY MEDICATION Medication Name: Shingrix IM x 1. Repeat in 2-6 mo 1 Units 0   No facility-administered medications prior to visit.    Allergies  Allergen Reactions   Losartan Swelling    Angioedema   Nsaids Swelling    IBU - tongue swlling.    Levofloxacin Swelling   Oxycodone Hcl Swelling    Lip started to swell up    Verapamil Swelling   Biaxin [Clarithromycin] Other (See Comments)    Elevated heart rate   Oxycontin [Oxycodone]     Lips swell   Vicodin [Hydrocodone-Acetaminophen]     swelling    ROS Review of Systems    Objective:    Physical Exam Constitutional:      Appearance: Normal appearance. She is well-developed.  HENT:     Head: Normocephalic and  atraumatic.  Cardiovascular:     Rate and Rhythm: Normal rate and regular rhythm.     Heart sounds: Normal heart sounds.  Pulmonary:     Effort: Pulmonary effort is normal.     Breath sounds: Normal breath sounds.  Skin:    General: Skin is warm and dry.  Neurological:     Mental Status: She is alert and oriented to person, place, and time.  Psychiatric:        Behavior: Behavior normal.   BP (!) 144/75 (BP Location: Left Arm)    Pulse 94    Resp 16    Ht _0  (1.651 m)    Wt 162 lb (73.5 kg)    SpO2 98%    BMI 26.96 kg/m  Wt Readings from Last 3 Encounters:  10/17/21 162 lb (73.5 kg)  07/14/21 169 lb (76.7 kg)  04/14/21 166 lb (75.3 kg)     Health Maintenance Due  Topic Date Due   Pneumonia Vaccine 70+ Years old (2 - PPSV23 if available, else PCV20) 07/28/2015    There are no preventive care reminders to display for this patient.  No results found for: TSH Lab Results  Component Value Date   WBC 9.2 07/14/2021   HGB 12.5 07/14/2021   HCT 36.8 07/14/2021   MCV 86.2 07/14/2021   PLT 331 07/14/2021   Lab Results  Component Value Date   NA 142 07/14/2021   K 4.8 07/14/2021   CO2 26 07/14/2021   GLUCOSE 132 (H) 07/14/2021   BUN 17 07/14/2021   CREATININE 0.94 07/14/2021   BILITOT 0.5 07/14/2021   ALKPHOS 83 03/27/2019   AST 11 07/14/2021   ALT 7 07/14/2021   PROT 7.0 07/14/2021   ALBUMIN 3.7 09/25/2013   CALCIUM 9.9 07/14/2021   EGFR 61 07/14/2021   Lab Results  Component Value Date   CHOL 123 07/14/2021   Lab Results  Component Value Date   HDL 30 (L) 07/14/2021   Lab Results  Component Value Date    LDLCALC 65 07/14/2021   Lab Results  Component Value Date   TRIG 215 (H) 07/14/2021   Lab Results  Component Value Date   CHOLHDL 4.1 07/14/2021   Lab Results  Component Value Date   HGBA1C 6.5 (A) 10/17/2021      Assessment & Plan:   Problem List Items Addressed This Visit       Cardiovascular and Mediastinum   Essential hypertension - Primary    Pressure little borderline today.  Interestingly her systolic went up after we checked it and her diastolic went down.  So for now we will just continue to keep an eye on it.  Continue carvedilol and hydrochlorothiazide.        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  Did encourage her to let me know if the Vania Rea is expensive or if it changes tears this year but otherwise she is doing a fantastic job.  Weight is stable.  Continue work on [employer] and staying active.      Relevant Medications   empagliflozin (JARDIANCE) 10 MG TABS tablet   ACCU-CHEK GUIDE test strip   Type 2 diabetes mellitus with diabetic neuropathy, unspecified (HCC)    Continue gabapentin.      Relevant Medications   empagliflozin (JARDIANCE) 10 MG TABS tablet   Other Relevant Orders   POCT HgB A1C (Completed)  POCT UA - Microalbumin (Completed)     Genitourinary   Chronic kidney disease (CKD), stage III (moderate) (HCC)    For urine microalbumin today.        Other   BMI 26.0-26.9,adult    BMI is now down to 26 which is fantastic.       Meds ordered this encounter  Medications   empagliflozin (JARDIANCE) 10 MG TABS tablet    Sig: TAKE 1 TABLET(10 MG) BY MOUTH DAILY BEFORE BREAKFAST    Dispense:  90 tablet    Refill:  1   ACCU-CHEK GUIDE test strip    Sig: Test twice daily.  DX: E11.49    Dispense:  200 each    Refill:  6    Follow-up: Return in about 13 weeks (around 01/16/2022) for Diabetes follow-up, Hypertension.    Beatrice Lecher,  MD

## 2021-10-17 NOTE — Assessment & Plan Note (Signed)
A1c looks fantastic today at 6.5.  Did encourage her to let me know if the [place] is expensive or if it changes tears this year but otherwise she is doing a fantastic job.  Weight is stable.  Continue work on [employer] and staying active.

## 2021-10-17 NOTE — Assessment & Plan Note (Signed)
Pressure little borderline today.  Interestingly her systolic went up after we checked it and her diastolic went down.  So for now we will just continue to keep an eye on it.  Continue carvedilol and hydrochlorothiazide.

## 2021-10-17 NOTE — Progress Notes (Signed)
Diabetes eye exam done 05/2021 at [facility] in [place]

## 2021-10-31 ENCOUNTER — Other Ambulatory Visit: Payer: Self-pay

## 2021-10-31 ENCOUNTER — Ambulatory Visit (INDEPENDENT_AMBULATORY_CARE_PROVIDER_SITE_OTHER): Payer: Medicare PPO | Admitting: Family Medicine

## 2021-10-31 VITALS — BP 133/60 | HR 55

## 2021-10-31 DIAGNOSIS — I10 Essential (primary) hypertension: Secondary | ICD-10-CM | POA: Diagnosis not present

## 2021-10-31 NOTE — Progress Notes (Signed)
Established Patient Office Visit  Subjective:  Patient ID: [name], female    DOB: September 25, 1940  Age: 81 y.o. MRN: [id number]  CC:  Chief Complaint  Patient presents with   Hypertension    HPI [name] presents for blood pressure check. Denies chest pain, shortness of breath or dizziness.   Past Medical History:  Diagnosis Date   Arthritis    "back; left hip; knees" (12/04/2013)   Asthma    Chronic bronchitis ([place])    "usually get it twice/yr" (12/04/2013)   Diabetes mellitus without complication (HCC)    Environmental and seasonal allergies    GERD (gastroesophageal reflux disease)    High cholesterol    Hypertension    pcp   james manning      Pneumonia    as a child    Past Surgical History:  Procedure Laterality Date   ABDOMINAL HYSTERECTOMY     BLADDER SUSPENSION     COLONOSCOPY     JOINT REPLACEMENT     SHOULDER ARTHROSCOPY W/ ROTATOR CUFF REPAIR Right    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   Procedure: TOTAL HIP ARTHROPLASTY;  Surgeon: Newt Minion, MD;  Location: [facility];  Service: Orthopedics;  Laterality: Left;  Left Total Hip Arthroplasty   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   Procedure: TOTAL KNEE ARTHROPLASTY;  Surgeon: Newt Minion, MD;  Location: [facility];  Service: Orthopedics;  Laterality: Right;  Right Total Knee Arthroplasty    Family History  Problem Relation Age of Onset   Hypertension Mother    Heart disease Mother    Transient ischemic attack Mother    CAD Father    Cancer - Ovarian Sister    Diabetes Daughter     Social History   Socioeconomic History   Marital status: Widowed    Spouse name: Not on file   Number of children: 4   Years of education: 12th Grade   Highest education level: High school graduate  Occupational History    Comment: Retired  Tobacco Use   Smoking status: Never   Smokeless tobacco: Never  Vaping Use   Vaping Use: Never used  Substance and  Sexual Activity   Alcohol use: No   Drug use: No   Sexual activity: Yes  Other Topics Concern   Not on file  Social History Narrative   Retired. Widowed. Husband died 2019/01/22. Lives alone. She enjoys doing puzzles and yardwork.   Social Determinants of Health   Financial Resource Strain: Low Risk    Difficulty of Paying Living Expenses: Not hard at all  Food Insecurity: No Food Insecurity   Worried About Charity fundraiser in the Last Year: Never true   [place] in the Last Year: Never true  Transportation Needs: No Transportation Needs   Lack of Transportation (Medical): No   Lack of Transportation (Non-Medical): No  Physical Activity: Inactive   Days of Exercise per Week: 0 days   Minutes of Exercise per Session: 0 min  Stress: No Stress Concern Present   Feeling of Stress : Not at all  Social Connections: Moderately Integrated   Frequency of Communication with Friends and Family: More than three times a week   Frequency of Social Gatherings with Friends and Family: Once a week   Attends Religious Services: More than 4 times per year   Active Member of Clubs or Organizations: Yes   Attends  Club or Organization Meetings: More than 4 times per year   Marital Status: Widowed  Human resources officer Violence: Not At Risk   Fear of Current or Ex-Partner: No   Emotionally Abused: No   Physically Abused: No   Sexually Abused: No    Outpatient Medications Prior to Visit  Medication Sig Dispense Refill   Accu-Chek FastClix Lancets MISC For checking blood sugars up to 3 times daily. E11.49 306 each 4   ACCU-CHEK GUIDE test strip Test twice daily.  DX: E11.49 200 each 6   aspirin 81 MG chewable tablet Chew 1 tablet by mouth daily.     blood glucose meter kit and supplies Dispense based on patient and insurance preference. For testing up to twice daily. E11.49 1 each 0   Capsaicin-Menthol 0.025-10 % GEL Apply 1 application topically at bedtime. To feet 45 g PRN   carvedilol  (COREG) 6.25 MG tablet TAKE 1 TABLET(6.25 MG) BY MOUTH TWICE DAILY WITH A MEAL 180 tablet 0   cetirizine (ZYRTEC) 10 MG tablet Take 10 mg by mouth daily.     clobetasol ointment (TEMOVATE) 8.61 % Apply 1 application topically 2 (two) times daily.     dorzolamide (TRUSOPT) 2 % ophthalmic solution 1 drop 2 (two) times daily.     empagliflozin (JARDIANCE) 10 MG TABS tablet TAKE 1 TABLET(10 MG) BY MOUTH DAILY BEFORE BREAKFAST 90 tablet 1   fluticasone (FLONASE) 50 MCG/ACT nasal spray SHAKE LIQUID AND USE 2 SPRAYS IN EACH NOSTRIL DAILY 16 g 3   gabapentin (NEURONTIN) 300 MG capsule TAKE 1 CAPSULE(300 MG) BY MOUTH TWICE DAILY 180 capsule 1   hydrochlorothiazide (HYDRODIURIL) 25 MG tablet TAKE 1 TABLET(25 MG) BY MOUTH DAILY 90 tablet 1   ipratropium-albuterol (DUONEB) 0.5-2.5 (3) MG/3ML SOLN Take 3 mLs by nebulization every 6 (six) hours as needed. 120 mL 1   metFORMIN (GLUCOPHAGE) 1000 MG tablet TAKE 1 TABLET(1000 MG) BY MOUTH TWICE DAILY WITH A MEAL 180 tablet 1   simvastatin (ZOCOR) 80 MG tablet Take 1 tablet (80 mg total) by mouth at bedtime. 90 tablet 3   No facility-administered medications prior to visit.    Allergies  Allergen Reactions   Losartan Swelling    Angioedema   Nsaids Swelling    IBU - tongue swlling.    Levofloxacin Swelling   Oxycodone Hcl Swelling    Lip started to swell up   Verapamil Swelling   Biaxin [Clarithromycin] Other (See Comments)    Elevated heart rate   Oxycontin [Oxycodone]     Lips swell   Vicodin [Hydrocodone-Acetaminophen]     swelling    ROS Review of Systems    Objective:    Physical Exam  BP 133/60    Pulse (!) 55  Wt Readings from Last 3 Encounters:  10/17/21 162 lb (73.5 kg)  07/14/21 169 lb (76.7 kg)  04/14/21 166 lb (75.3 kg)     Health Maintenance Due  Topic Date Due   TETANUS/TDAP  10/22/2021    There are no preventive care reminders to display for this patient.  No results found for: TSH Lab Results  Component Value  Date   WBC 9.2 07/14/2021   HGB 12.5 07/14/2021   HCT 36.8 07/14/2021   MCV 86.2 07/14/2021   PLT 331 07/14/2021   Lab Results  Component Value Date   NA 142 07/14/2021   K 4.8 07/14/2021   CO2 26 07/14/2021   GLUCOSE 132 (H) 07/14/2021   BUN 17 07/14/2021  CREATININE 0.94 07/14/2021   BILITOT 0.5 07/14/2021   ALKPHOS 83 03/27/2019   AST 11 07/14/2021   ALT 7 07/14/2021   PROT 7.0 07/14/2021   ALBUMIN 3.7 09/25/2013   CALCIUM 9.9 07/14/2021   EGFR 61 07/14/2021   Lab Results  Component Value Date   CHOL 123 07/14/2021   Lab Results  Component Value Date   HDL 30 (L) 07/14/2021   Lab Results  Component Value Date   LDLCALC 65 07/14/2021   Lab Results  Component Value Date   TRIG 215 (H) 07/14/2021   Lab Results  Component Value Date   CHOLHDL 4.1 07/14/2021   Lab Results  Component Value Date   HGBA1C 6.5 (A) 10/17/2021      Assessment & Plan:  HTN - blood pressure within normal limits. Patient advised to continue current medications as directed. Follow up in 3 months for HTN, with Dr Madilyn Fireman.   Problem List Items Addressed This Visit     Essential hypertension - Primary    No orders of the defined types were placed in this encounter.   Follow-up: Return in about 3 months (around 01/28/2022) for HTN with Dr Madilyn Fireman. Durene Romans, Monico Blitz, [place]

## 2021-10-31 NOTE — Progress Notes (Signed)
Agree with documentation as above.   Bradly Sangiovanni, MD  

## 2021-11-04 ENCOUNTER — Encounter: Payer: Self-pay | Admitting: Podiatry

## 2021-11-04 ENCOUNTER — Ambulatory Visit: Payer: Medicare PPO | Admitting: Podiatry

## 2021-11-04 ENCOUNTER — Other Ambulatory Visit: Payer: Self-pay

## 2021-11-04 DIAGNOSIS — M79675 Pain in left toe(s): Secondary | ICD-10-CM

## 2021-11-04 DIAGNOSIS — M79674 Pain in right toe(s): Secondary | ICD-10-CM

## 2021-11-04 DIAGNOSIS — E1149 Type 2 diabetes mellitus with other diabetic neurological complication: Secondary | ICD-10-CM | POA: Diagnosis not present

## 2021-11-04 DIAGNOSIS — B351 Tinea unguium: Secondary | ICD-10-CM

## 2021-11-04 NOTE — Progress Notes (Signed)
Subjective: 81 y.o. returns the office today for painful, elongated, thickened toenails which she cannot trim herself. Denies any redness or drainage around the nails. She is still on gabapentin twice a day which helps some.  Denies any acute changes since last appointment and no new complaints today. Denies any systemic complaints such as fevers, chills, nausea, vomiting.   PCP: Agapito Games, MD Last Seen:10/31/21  A1c: 6.5 on 10/17/21  Objective: AAO 3, NAD DP/PT pulses palpable, CRT less than 3 seconds Nails hypertrophic, dystrophic, elongated, brittle, discolored 10. There is tenderness overlying the nails 1-5 bilaterally. There is no surrounding erythema or drainage along the nail sites. No area pinpoint tenderness.  No edema, erythema.  No other areas of discomfort identified on today's exam. No open lesions or pre-ulcerative lesions are identified. No pain with calf compression, swelling, warmth, erythema.  Assessment: Patient presents with symptomatic onychomycosis; neuropathy   Plan: -Treatment options including alternatives, risks, complications were discussed -Nails sharply debrided 10 without complication/bleeding. -Continue gabapentin.  . -Discussed daily foot inspection. If there are any changes, to call the office immediately.  -Follow-up in 3 months or sooner if any problems are to arise. In the meantime, encouraged to call the office with any questions, concerns, changes symptoms.  Louann Sjogren, DPM

## 2021-11-18 DIAGNOSIS — M542 Cervicalgia: Secondary | ICD-10-CM | POA: Diagnosis not present

## 2021-11-18 DIAGNOSIS — M7918 Myalgia, other site: Secondary | ICD-10-CM | POA: Diagnosis not present

## 2021-11-18 DIAGNOSIS — M47812 Spondylosis without myelopathy or radiculopathy, cervical region: Secondary | ICD-10-CM | POA: Diagnosis not present

## 2021-12-07 ENCOUNTER — Other Ambulatory Visit: Payer: Self-pay | Admitting: Family Medicine

## 2021-12-07 DIAGNOSIS — E1149 Type 2 diabetes mellitus with other diabetic neurological complication: Secondary | ICD-10-CM

## 2021-12-10 ENCOUNTER — Other Ambulatory Visit: Payer: Self-pay | Admitting: Family Medicine

## 2021-12-10 DIAGNOSIS — I10 Essential (primary) hypertension: Secondary | ICD-10-CM

## 2021-12-11 DIAGNOSIS — Z881 Allergy status to other antibiotic agents status: Secondary | ICD-10-CM | POA: Diagnosis not present

## 2021-12-11 DIAGNOSIS — Z043 Encounter for examination and observation following other accident: Secondary | ICD-10-CM | POA: Diagnosis not present

## 2021-12-11 DIAGNOSIS — J9811 Atelectasis: Secondary | ICD-10-CM | POA: Diagnosis not present

## 2021-12-11 DIAGNOSIS — S0083XA Contusion of other part of head, initial encounter: Secondary | ICD-10-CM | POA: Diagnosis not present

## 2021-12-11 DIAGNOSIS — I10 Essential (primary) hypertension: Secondary | ICD-10-CM | POA: Diagnosis not present

## 2021-12-11 DIAGNOSIS — E1151 Type 2 diabetes mellitus with diabetic peripheral angiopathy without gangrene: Secondary | ICD-10-CM | POA: Diagnosis not present

## 2021-12-11 DIAGNOSIS — Z886 Allergy status to analgesic agent status: Secondary | ICD-10-CM | POA: Diagnosis not present

## 2021-12-11 DIAGNOSIS — K449 Diaphragmatic hernia without obstruction or gangrene: Secondary | ICD-10-CM | POA: Diagnosis not present

## 2021-12-11 DIAGNOSIS — T07XXXA Unspecified multiple injuries, initial encounter: Secondary | ICD-10-CM | POA: Diagnosis not present

## 2021-12-11 DIAGNOSIS — S0512XA Contusion of eyeball and orbital tissues, left eye, initial encounter: Secondary | ICD-10-CM | POA: Diagnosis not present

## 2021-12-11 DIAGNOSIS — S0990XA Unspecified injury of head, initial encounter: Secondary | ICD-10-CM | POA: Diagnosis not present

## 2021-12-11 DIAGNOSIS — Z885 Allergy status to narcotic agent status: Secondary | ICD-10-CM | POA: Diagnosis not present

## 2021-12-11 DIAGNOSIS — S01112A Laceration without foreign body of left eyelid and periocular area, initial encounter: Secondary | ICD-10-CM | POA: Diagnosis not present

## 2021-12-11 DIAGNOSIS — Z888 Allergy status to other drugs, medicaments and biological substances status: Secondary | ICD-10-CM | POA: Diagnosis not present

## 2021-12-11 DIAGNOSIS — Z7982 Long term (current) use of aspirin: Secondary | ICD-10-CM | POA: Diagnosis not present

## 2021-12-11 DIAGNOSIS — S199XXA Unspecified injury of neck, initial encounter: Secondary | ICD-10-CM | POA: Diagnosis not present

## 2021-12-13 ENCOUNTER — Ambulatory Visit: Payer: Medicare PPO | Admitting: Family Medicine

## 2021-12-13 ENCOUNTER — Encounter: Payer: Self-pay | Admitting: Family Medicine

## 2021-12-13 ENCOUNTER — Other Ambulatory Visit: Payer: Self-pay

## 2021-12-13 VITALS — BP 136/55 | HR 57 | Ht 65.0 in | Wt 162.0 lb

## 2021-12-13 DIAGNOSIS — I517 Cardiomegaly: Secondary | ICD-10-CM | POA: Diagnosis not present

## 2021-12-13 DIAGNOSIS — S0083XA Contusion of other part of head, initial encounter: Secondary | ICD-10-CM

## 2021-12-13 NOTE — Progress Notes (Signed)
? ?Established Patient Office Visit ? ?Subjective:  ?Patient ID: [name], female    DOB: November 24, 1940  Age: 81 y.o. MRN: [id number] ? ?CC:  ?Chief Complaint  ?Patient presents with  ? Hospitalization Follow-up  ? ? ?HPI ?[name] presents for ED f/u for a fall on 12/11/2021.  The ED notes she tripped over a ledge going into church and hit her head on the concrete.  She does take a baby aspirin daily she denies loss of consciousness. ? ?While there she had a CT of the facial bones which was negative it just showed a contusion around the left eye.  CT of the cervical spine was negative for fracture.  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  Head CT was negative for any bleed but did show some mild cerebral atrophy and periventricular white matter changes.  CT of chest shows no LNs or acute process in the chest.  Also noted to have a small hiatal hernia and some atherosclerosis in the aorta and coronaries.  Also some cardiomegaly without effusion. ? ?Past Medical History:  ?Diagnosis Date  ? Arthritis   ? "back; left hip; knees" (12/04/2013)  ? Asthma   ? Chronic bronchitis ([place])   ? "usually get it twice/yr" (12/04/2013)  ? Diabetes mellitus without complication ([place])   ? Environmental and seasonal allergies   ? GERD (gastroesophageal reflux disease)   ? High cholesterol   ? Hypertension   ? pcp   [facility]     ? Pneumonia   ? as a child  ? ? ?Past Surgical History:  ?Procedure Laterality Date  ? ABDOMINAL HYSTERECTOMY    ? BLADDER SUSPENSION    ? COLONOSCOPY    ? JOINT REPLACEMENT    ? SHOULDER ARTHROSCOPY W/ ROTATOR CUFF REPAIR Right   ? 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  ? 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  ? Procedure: TOTAL HIP ARTHROPLASTY;  Surgeon: Newt Minion, MD;  Location: [facility];  Service: Orthopedics;  Laterality: Left;  Left Total Hip Arthroplasty  ? 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  ? Procedure: TOTAL KNEE ARTHROPLASTY;  Surgeon: Newt Minion, MD;  Location:  [facility];  Service: Orthopedics;  Laterality: Right;  Right Total Knee Arthroplasty  ? ? ?Family History  ?Problem Relation Age of Onset  ? Hypertension Mother   ? Heart disease Mother   ? Transient ischemic attack Mother   ? CAD Father   ? Cancer - Ovarian Sister   ? Diabetes Daughter   ? ? ?Social History  ? ?Socioeconomic History  ? Marital status: Widowed  ?  Spouse name: Not on file  ? Number of children: 4  ? Years of education: 12th Grade  ? Highest education level: High school graduate  ?Occupational History  ?  Comment: Retired  ?Tobacco Use  ? Smoking status: Never  ? Smokeless tobacco: Never  ?Vaping Use  ? Vaping Use: Never used  ?Substance and Sexual Activity  ? Alcohol use: No  ? Drug use: No  ? Sexual activity: Yes  ?Other Topics Concern  ? Not on file  ?Social History Narrative  ? Retired. Widowed. Husband died 2019/01/25. Lives alone. She enjoys doing puzzles and yardwork.  ? ?Social Determinants of Health  ? ?Financial Resource Strain: Not on file  ?Food Insecurity: Not on file  ?Transportation Needs: Not on file  ?Physical Activity: Not on file  ?Stress: Not on file  ?Social Connections: Not on file  ?  Intimate Partner Violence: Not on file  ? ? ?Outpatient Medications Prior to Visit  ?Medication Sig Dispense Refill  ? Accu-Chek FastClix Lancets MISC For checking blood sugars up to 3 times daily. E11.49 306 each 4  ? ACCU-CHEK GUIDE test strip Test twice daily.  DX: E11.49 200 each 6  ? aspirin 81 MG chewable tablet Chew 1 tablet by mouth daily.    ? blood glucose meter kit and supplies Dispense based on patient and insurance preference. For testing up to twice daily. E11.49 1 each 0  ? Capsaicin-Menthol 0.025-10 % GEL Apply 1 application topically at bedtime. To feet 45 g PRN  ? carvedilol (COREG) 6.25 MG tablet TAKE 1 TABLET(6.25 MG) BY MOUTH TWICE DAILY WITH A MEAL 180 tablet 0  ? cetirizine (ZYRTEC) 10 MG tablet Take 10 mg by mouth daily.    ? clobetasol ointment (TEMOVATE) 8.65 % Apply 1  application topically 2 (two) times daily.    ? dorzolamide (TRUSOPT) 2 % ophthalmic solution 1 drop 2 (two) times daily.    ? empagliflozin (JARDIANCE) 10 MG TABS tablet TAKE 1 TABLET(10 MG) BY MOUTH DAILY BEFORE BREAKFAST 90 tablet 1  ? fluticasone (FLONASE) 50 MCG/ACT nasal spray SHAKE LIQUID AND USE 2 SPRAYS IN EACH NOSTRIL DAILY 16 g 3  ? gabapentin (NEURONTIN) 300 MG capsule TAKE 1 CAPSULE(300 MG) BY MOUTH TWICE DAILY 180 capsule 1  ? hydrochlorothiazide (HYDRODIURIL) 25 MG tablet TAKE 1 TABLET(25 MG) BY MOUTH DAILY 90 tablet 1  ? ipratropium-albuterol (DUONEB) 0.5-2.5 (3) MG/3ML SOLN Take 3 mLs by nebulization every 6 (six) hours as needed. 120 mL 1  ? metFORMIN (GLUCOPHAGE) 1000 MG tablet TAKE 1 TABLET(1000 MG) BY MOUTH TWICE DAILY WITH A MEAL 180 tablet 1  ? simvastatin (ZOCOR) 80 MG tablet Take 1 tablet (80 mg total) by mouth at bedtime. 90 tablet 3  ? ?No facility-administered medications prior to visit.  ? ? ?Allergies  ?Allergen Reactions  ? Losartan Swelling  ?  Angioedema  ? Nsaids Swelling  ?  IBU - tongue swlling.   ? Levofloxacin Swelling  ? Oxycodone Hcl Swelling  ?  Lip started to swell up  ? Verapamil Swelling  ? Biaxin [Clarithromycin] Other (See Comments)  ?  Elevated heart rate  ? Oxycontin [Oxycodone]   ?  Lips swell  ? Vicodin [Hydrocodone-Acetaminophen]   ?  swelling  ? ? ?ROS ?Review of Systems ? ?  ?Objective:  ?  ?Physical Exam ? ?BP (!) 136/55   Pulse (!) 57   Ht _0  (1.651 m)   Wt 162 lb (73.5 kg)   SpO2 99%   BMI 26.96 kg/m?  ?Wt Readings from Last 3 Encounters:  ?12/13/21 162 lb (73.5 kg)  ?10/17/21 162 lb (73.5 kg)  ?07/14/21 169 lb (76.7 kg)  ? ? ? ?Health Maintenance Due  ?Topic Date Due  ? TETANUS/TDAP  10/22/2021  ? ? ?There are no preventive care reminders to display for this patient. ? ?No results found for: TSH ?Lab Results  ?Component Value Date  ? WBC 9.2 07/14/2021  ? HGB 12.5 07/14/2021  ? HCT 36.8 07/14/2021  ? MCV 86.2 07/14/2021  ? PLT 331 07/14/2021  ? ?Lab  Results  ?Component Value Date  ? NA 142 07/14/2021  ? K 4.8 07/14/2021  ? CO2 26 07/14/2021  ? GLUCOSE 132 (H) 07/14/2021  ? BUN 17 07/14/2021  ? CREATININE 0.94 07/14/2021  ? BILITOT 0.5 07/14/2021  ? ALKPHOS 83 03/27/2019  ? AST  11 07/14/2021  ? ALT 7 07/14/2021  ? PROT 7.0 07/14/2021  ? ALBUMIN 3.7 09/25/2013  ? CALCIUM 9.9 07/14/2021  ? EGFR 61 07/14/2021  ? ?Lab Results  ?Component Value Date  ? CHOL 123 07/14/2021  ? ?Lab Results  ?Component Value Date  ? HDL 30 (L) 07/14/2021  ? ?Lab Results  ?Component Value Date  ? [place] 65 07/14/2021  ? ?Lab Results  ?Component Value Date  ? TRIG 215 (H) 07/14/2021  ? ?Lab Results  ?Component Value Date  ? CHOLHDL 4.1 07/14/2021  ? ?Lab Results  ?Component Value Date  ? HGBA1C 6.5 (A) 10/17/2021  ? ? ?  ?Assessment & Plan:  ? ?Problem List Items Addressed This Visit   ?None ?Visit Diagnoses   ? ? Contusion of face, initial encounter    -  Primary  ? Cardiomegaly      ? Relevant Orders  ? ECHOCARDIOGRAM COMPLETE  ? ?  ? ?Contusion of face-gave her some follow-up instructions to continue to ice and hopefully in a few days she can start doing some massage to the hematoma over her left eyebrow.  If she is having any problems please let us know.  She does feel like she can see out of her left eye better today but encouraged her to keep an eye on that and let us know if its not continuing to improve. ? ?Cardiomegaly noted on chest CT-we will work-up further with echocardiogram she would prefer to have it done at [facility] in [place].  Order placed today.  Also reviewed the additional scan findings with her today ? ?No orders of the defined types were placed in this encounter. ? ? ?Follow-up: No follow-ups on file.  ? ? ?Beatrice Lecher, MD ?

## 2021-12-29 DIAGNOSIS — M1611 Unilateral primary osteoarthritis, right hip: Secondary | ICD-10-CM | POA: Diagnosis not present

## 2021-12-29 DIAGNOSIS — M7061 Trochanteric bursitis, right hip: Secondary | ICD-10-CM | POA: Diagnosis not present

## 2021-12-29 DIAGNOSIS — M545 Low back pain, unspecified: Secondary | ICD-10-CM | POA: Diagnosis not present

## 2021-12-29 DIAGNOSIS — G8929 Other chronic pain: Secondary | ICD-10-CM | POA: Diagnosis not present

## 2021-12-29 DIAGNOSIS — M25551 Pain in right hip: Secondary | ICD-10-CM | POA: Diagnosis not present

## 2022-01-02 ENCOUNTER — Ambulatory Visit (INDEPENDENT_AMBULATORY_CARE_PROVIDER_SITE_OTHER): Payer: Medicare PPO | Admitting: Family Medicine

## 2022-01-02 DIAGNOSIS — Z0000 Encounter for general adult medical examination without abnormal findings: Secondary | ICD-10-CM | POA: Diagnosis not present

## 2022-01-02 NOTE — Patient Instructions (Addendum)
?MEDICARE ANNUAL WELLNESS VISIT ?Health Maintenance Summary and Written Plan of Care ? ?[name] , ? ?Thank you for allowing me to perform your Medicare Annual Wellness Visit and for your ongoing commitment to your health.  ? ?Health Maintenance & Immunization History ?Health Maintenance  ?Topic Date Due  ? Zoster Vaccines- Shingrix (1 of 2) 01/15/2022 (Originally 10/10/1990)  ? COVID-19 Vaccine (3 - Booster for Pfizer series) 07/30/2022 (Originally 12/29/2019)  ? TETANUS/TDAP  01/03/2023 (Originally 10/22/2021)  ? HEMOGLOBIN A1C  04/16/2022  ? INFLUENZA VACCINE  04/18/2022  ? OPHTHALMOLOGY EXAM  09/19/2022  ? FOOT EXAM  10/17/2022  ? URINE MICROALBUMIN  10/17/2022  ? Pneumonia Vaccine 94+ Years old  Completed  ? DEXA SCAN  Completed  ? HPV VACCINES  Aged Out  ? ?Immunization History  ?Administered Date(s) Administered  ? Fluad Quad(high Dose 65+) 06/19/2019, 06/23/2020, 07/14/2021  ? Influenza, High Dose Seasonal PF 06/12/2016, 06/04/2017, 06/06/2018  ? Influenza,trivalent, recombinat, inj, PF 07/01/2012, 06/16/2013, 05/28/2014, 05/31/2015  ? Influenza-Unspecified 05/19/2013  ? PFIZER(Purple Top)SARS-COV-2 Vaccination 10/13/2019, 11/03/2019  ? Pneumococcal Conjugate-13 07/27/2014  ? Pneumococcal Polysaccharide-23 10/23/2011  ? Pneumococcal-Unspecified 10/23/2011  ? Tdap 10/23/2011  ? ? ?These are the patient goals that we discussed: ? Goals Addressed   ? ?  ?  ?  ?  ?  ? This Visit's Progress  ?   Patient Stated (pt-stated)     ?   01/02/2022 ?AWV Goal: Exercise for General Health ? ?Patient will verbalize understanding of the benefits of increased physical activity: ?Exercising regularly is important. It will improve your overall fitness, flexibility, and endurance. ?Regular exercise also will improve your overall health. It can help you control your weight, reduce stress, and improve your bone density. ?Over the next year, patient will increase physical activity as tolerated with a goal of at least 150 minutes of  moderate physical activity per week.  ?You can tell that you are exercising at a moderate intensity if your heart starts beating faster and you start breathing faster but can still hold a conversation. ?Moderate-intensity exercise ideas include: ?Walking 1 mile (1.6 km) in about 15 minutes ?Biking ?Hiking ?Golfing ?Dancing ?Water aerobics ?Patient will verbalize understanding of everyday activities that increase physical activity by providing examples like the following: ?Yard work, such as: ?Pushing a Conservation officer, nature ?Raking and bagging leaves ?Washing your car ?Pushing a stroller ?Shoveling snow ?Gardening ?Washing windows or floors ?Patient will be able to explain general safety guidelines for exercising:  ?Before you start a new exercise program, talk with your health care provider. ?Do not exercise so much that you hurt yourself, feel dizzy, or get very short of breath. ?Wear comfortable clothes and wear shoes with good support. ?Drink plenty of water while you exercise to prevent dehydration or heat stroke. ?Work out until your breathing and your heartbeat get faster.  ?  ? ?  ?  ? ?This is a list of Health Maintenance Items that are overdue or due now: ?Td vaccine ?Shingrix vaccine ? ?Orders/Referrals Placed Today: ?No orders of the defined types were placed in this encounter. ? ?(Contact our referral department at [phone] if you have not spoken with someone about your referral appointment within the next 5 days)  ? ? ?Follow-up Plan ?Follow-up with Hali Marry, MD as planned ?Schedule your tetanus and shingles vaccine at your pharmacy. ?Medicare wellness visit in one year.  ?AVS printed and mailed to the patient. ? ? ? ?  ?Health Maintenance, Female ?Adopting a healthy  lifestyle and getting preventive care are important in promoting health and wellness. Ask your health care provider about: ?The right schedule for you to have regular tests and exams. ?Things you can do on your own to prevent  diseases and keep yourself healthy. ?What should I know about diet, weight, and exercise? ?Eat a healthy diet ? ?Eat a diet that includes plenty of vegetables, fruits, low-fat dairy products, and lean protein. ?Do not eat a lot of foods that are high in solid fats, added sugars, or sodium. ?Maintain a healthy weight ?Body mass index (BMI) is used to identify weight problems. It estimates body fat based on height and weight. Your health care provider can help determine your BMI and help you achieve or maintain a healthy weight. ?Get regular exercise ?Get regular exercise. This is one of the most important things you can do for your health. Most adults should: ?Exercise for at least 150 minutes each week. The exercise should increase your heart rate and make you sweat (moderate-intensity exercise). ?Do strengthening exercises at least twice a week. This is in addition to the moderate-intensity exercise. ?Spend less time sitting. Even light physical activity can be beneficial. ?Watch cholesterol and blood lipids ?Have your blood tested for lipids and cholesterol at 83 years of age, then have this test every 5 years. ?Have your cholesterol levels checked more often if: ?Your lipid or cholesterol levels are high. ?You are older than 83 years of age. ?You are at high risk for heart disease. ?What should I know about cancer screening? ?Depending on your health history and family history, you may need to have cancer screening at various ages. This may include screening for: ?Breast cancer. ?Cervical cancer. ?Colorectal cancer. ?Skin cancer. ?Lung cancer. ?What should I know about heart disease, diabetes, and high blood pressure? ?Blood pressure and heart disease ?High blood pressure causes heart disease and increases the risk of stroke. This is more likely to develop in people who have high blood pressure readings or are overweight. ?Have your blood pressure checked: ?Every 3-5 years if you are 59-80 years of age. ?Every  year if you are 58 years old or older. ?Diabetes ?Have regular diabetes screenings. This checks your fasting blood sugar level. Have the screening done: ?Once every three years after age 26 if you are at a normal weight and have a low risk for diabetes. ?More often and at a younger age if you are overweight or have a high risk for diabetes. ?What should I know about preventing infection? ?Hepatitis B ?If you have a higher risk for hepatitis B, you should be screened for this virus. Talk with your health care provider to find out if you are at risk for hepatitis B infection. ?Hepatitis C ?Testing is recommended for: ?Everyone born from [birth year] through 1967. ?Anyone with known risk factors for hepatitis C. ?Sexually transmitted infections (STIs) ?Get screened for STIs, including gonorrhea and chlamydia, if: ?You are sexually active and are younger than 83 years of age. ?You are older than 83 years of age and your health care provider tells you that you are at risk for this type of infection. ?Your sexual activity has changed since you were last screened, and you are at increased risk for chlamydia or gonorrhea. Ask your health care provider if you are at risk. ?Ask your health care provider about whether you are at high risk for HIV. Your health care provider may recommend a prescription medicine to help prevent HIV infection. If you choose to  take medicine to prevent HIV, you should first get tested for HIV. You should then be tested every 3 months for as long as you are taking the medicine. ?Pregnancy ?If you are about to stop having your period (premenopausal) and you may become pregnant, seek counseling before you get pregnant. ?Take 400 to 800 micrograms (mcg) of folic acid every day if you become pregnant. ?Ask for birth control (contraception) if you want to prevent pregnancy. ?Osteoporosis and menopause ?Osteoporosis is a disease in which the bones lose minerals and strength with aging. This can result in bone  fractures. If you are 29 years old or older, or if you are at risk for osteoporosis and fractures, ask your health care provider if you should: ?Be screened for bone loss. ?Take a calcium or vitamin D supplement

## 2022-01-02 NOTE — Progress Notes (Signed)
? ? ?MEDICARE ANNUAL WELLNESS VISIT ? ?01/02/2022 ? ?Telephone Visit Disclaimer ?This Medicare AWV was conducted by telephone due to national recommendations for restrictions regarding the COVID-19 Pandemic (e.g. social distancing).  I verified, using two identifiers, that I am speaking with [name] or their authorized healthcare agent. I discussed the limitations, risks, security, and privacy concerns of performing an evaluation and management service by telephone and the potential availability of an in-person appointment in the future. The patient expressed understanding and agreed to proceed.  ?Location of Patient: Home ?Location of Provider (nurse):  In the office. ? ?Subjective:  ? ? ?[name] is a 81 y.o. female patient of Metheney, Rene Kocher, MD who had a Medicare Annual Wellness Visit today via telephone. [name] is Retired and lives alone. she has 4 children. she reports that she is socially active and does interact with friends/family regularly. she is minimally physically active and enjoys doing puzzles and yard work. ? ?Patient Care Team: ?Hali Marry, MD as PCP - General (Family Medicine) ? ? ?  01/02/2022  ? 10:38 AM 12/08/2020  ? 11:07 AM 03/11/2020  ?  8:54 AM 05/01/2019  ?  3:48 PM 12/04/2013  ?  4:01 PM 11/25/2013  ? 11:23 AM 09/25/2013  ? 10:33 AM  ?Advanced Directives  ?Does Patient Have a Medical Advance Directive? Yes Yes Yes Yes Patient has advance directive, copy not in chart Patient has advance directive, copy not in chart Patient has advance directive, copy not in chart  ?Type of Advance Directive Living will;Healthcare Power of [place];Living will [place];Living will Healthcare Power of [place] of [place];Living will  ?Does patient want to make changes to medical advance directive? No - Patient declined No - Patient declined No - Patient declined No - Patient declined No  change requested No change requested No  ?Copy of [place] in Chart? No - copy requested No - copy requested No - copy requested  Copy requested from other (Comment) Copy requested from family Copy requested from family  ?Pre-existing out of facility DNR order (yellow form or pink MOST form)       No  ? ? ?Hospital Utilization Over the Past 12 Months: ?# of hospitalizations or ER visits: 1 ?# of surgeries: 0 ? ?Review of Systems    ?Patient reports that her overall health is unchanged compared to last year. ? ?History obtained from chart review and the patient ? ?Patient Reported Readings (BP, Pulse, CBG, Weight, etc) ?none ? ?Pain Assessment ?Pain : 0-10 ?Pain Score: 1  ?Pain Type: Chronic pain ?Pain Location: Hip ?Pain Orientation: Right ?Pain Descriptors / Indicators: Nagging ?Pain Onset: 1 to 4 weeks ago ?Pain Frequency: Intermittent ?Pain Relieving Factors: Tylenol ? ?Pain Relieving Factors: Tylenol ? ?Current Medications & Allergies (verified) ?Allergies as of 01/02/2022   ? ?   Reactions  ? Losartan Swelling  ? Angioedema  ? Nsaids Swelling  ? IBU - tongue swlling.   ? Levofloxacin Swelling  ? Oxycodone Hcl Swelling  ? Lip started to swell up  ? Verapamil Swelling  ? Biaxin [clarithromycin] Other (See Comments)  ? Elevated heart rate  ? Oxycontin [oxycodone]   ? Lips swell  ? Vicodin [hydrocodone-acetaminophen]   ? swelling  ? ?  ? ?  ?Medication List  ?  ? ?  ? Accurate as of January 02, 2022 10:53 AM. If you have any questions,  ask your nurse or doctor.  ?  ?  ? ?  ? ?Accu-Chek FastClix Lancets Misc ?For checking blood sugars up to 3 times daily. E11.49 ?  ?Accu-Chek Guide test strip ?Generic drug: glucose blood ?Test twice daily.  DX: E11.49 ?  ?acetaminophen 325 MG tablet ?Commonly known as: TYLENOL ?Take by mouth. ?  ?aspirin 81 MG chewable tablet ?Chew 1 tablet by mouth daily. ?  ?blood glucose meter kit and supplies ?Dispense based on patient and insurance preference. For testing up to  twice daily. E11.49 ?  ?Capsaicin-Menthol 0.025-10 % Gel ?Apply 1 application topically at bedtime. To feet ?  ?carvedilol 6.25 MG tablet ?Commonly known as: COREG ?TAKE 1 TABLET(6.25 MG) BY MOUTH TWICE DAILY WITH A MEAL ?  ?cetirizine 10 MG tablet ?Commonly known as: ZYRTEC ?Take 10 mg by mouth daily. ?  ?clobetasol ointment 0.05 % ?Commonly known as: TEMOVATE ?Apply 1 application topically 2 (two) times daily. ?  ?dorzolamide 2 % ophthalmic solution ?Commonly known as: TRUSOPT ?1 drop 2 (two) times daily. ?  ?empagliflozin 10 MG Tabs tablet ?Commonly known as: Jardiance ?TAKE 1 TABLET(10 MG) BY MOUTH DAILY BEFORE BREAKFAST ?  ?fluticasone 50 MCG/ACT nasal spray ?Commonly known as: FLONASE ?SHAKE LIQUID AND USE 2 SPRAYS IN EACH NOSTRIL DAILY ?  ?gabapentin 300 MG capsule ?Commonly known as: NEURONTIN ?TAKE 1 CAPSULE(300 MG) BY MOUTH TWICE DAILY ?  ?hydrochlorothiazide 25 MG tablet ?Commonly known as: HYDRODIURIL ?TAKE 1 TABLET(25 MG) BY MOUTH DAILY ?  ?ipratropium-albuterol 0.5-2.5 (3) MG/3ML Soln ?Commonly known as: DUONEB ?Take 3 mLs by nebulization every 6 (six) hours as needed. ?  ?metFORMIN 1000 MG tablet ?Commonly known as: GLUCOPHAGE ?TAKE 1 TABLET(1000 MG) BY MOUTH TWICE DAILY WITH A MEAL ?  ?simvastatin 80 MG tablet ?Commonly known as: ZOCOR ?Take 1 tablet (80 mg total) by mouth at bedtime. ?  ? ?  ? ? ?History (reviewed): ?Past Medical History:  ?Diagnosis Date  ? Arthritis   ? "back; left hip; knees" (12/04/2013)  ? Asthma   ? Chronic bronchitis ([place])   ? "usually get it twice/yr" (12/04/2013)  ? Diabetes mellitus without complication ([place])   ? Environmental and seasonal allergies   ? GERD (gastroesophageal reflux disease)   ? High cholesterol   ? Hypertension   ? pcp   [facility]     ? Pneumonia   ? as a child  ? ?Past Surgical History:  ?Procedure Laterality Date  ? ABDOMINAL HYSTERECTOMY    ? BLADDER SUSPENSION    ? COLONOSCOPY    ? JOINT REPLACEMENT    ? SHOULDER ARTHROSCOPY W/ ROTATOR CUFF REPAIR  Right   ? 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  ? 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  ? Procedure: TOTAL HIP ARTHROPLASTY;  Surgeon: Newt Minion, MD;  Location: [place];  Service: Orthopedics;  Laterality: Left;  Left Total Hip Arthroplasty  ? 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  ? Procedure: TOTAL KNEE ARTHROPLASTY;  Surgeon: Newt Minion, MD;  Location: [place];  Service: Orthopedics;  Laterality: Right;  Right Total Knee Arthroplasty  ? ?Family History  ?Problem Relation Age of Onset  ? Hypertension Mother   ? Heart disease Mother   ? Transient ischemic attack Mother   ? CAD Father   ? Cancer - Ovarian Sister   ? Diabetes Daughter   ? ?Social History  ? ?Socioeconomic History  ? Marital status: Widowed  ?  Spouse name: Not on file  ? Number of children:  4  ? Years of education: 12th Grade  ? Highest education level: High school graduate  ?Occupational History  ?  Comment: Retired  ?Tobacco Use  ? Smoking status: Never  ? Smokeless tobacco: Never  ?Vaping Use  ? Vaping Use: Never used  ?Substance and Sexual Activity  ? Alcohol use: No  ? Drug use: No  ? Sexual activity: Yes  ?Other Topics Concern  ? Not on file  ?Social History Narrative  ? Retired. Widowed. Husband died 01-08-19. Lives alone. She enjoys doing puzzles and yardwork.  ? ?Social Determinants of Health  ? ?Financial Resource Strain: Low Risk   ? Difficulty of Paying Living Expenses: Not hard at all  ?Food Insecurity: No Food Insecurity  ? Worried About Charity fundraiser in the Last Year: Never true  ? Ran Out of Food in the Last Year: Never true  ?Transportation Needs: No Transportation Needs  ? Lack of Transportation (Medical): No  ? Lack of Transportation (Non-Medical): No  ?Physical Activity: Inactive  ? Days of Exercise per Week: 0 days  ? Minutes of Exercise per Session: 0 min  ?Stress: No Stress Concern Present  ? Feeling of Stress : Not at all  ?Social Connections: Moderately Integrated  ? Frequency of Communication with Friends and  Family: More than three times a week  ? Frequency of Social Gatherings with Friends and Family: Twice a week  ? Attends Religious Services: More than 4 times per year  ? Active Member of [employer] or [place]

## 2022-01-05 ENCOUNTER — Encounter: Payer: Self-pay | Admitting: Family Medicine

## 2022-01-05 ENCOUNTER — Ambulatory Visit: Payer: Medicare PPO | Admitting: Family Medicine

## 2022-01-05 VITALS — BP 181/69 | HR 56 | Resp 18 | Ht 65.0 in | Wt 156.9 lb

## 2022-01-05 DIAGNOSIS — R2681 Unsteadiness on feet: Secondary | ICD-10-CM | POA: Diagnosis not present

## 2022-01-05 DIAGNOSIS — I10 Essential (primary) hypertension: Secondary | ICD-10-CM | POA: Diagnosis not present

## 2022-01-05 DIAGNOSIS — R42 Dizziness and giddiness: Secondary | ICD-10-CM

## 2022-01-05 MED ORDER — AMLODIPINE BESYLATE 2.5 MG PO TABS: 2.5000 mg | ORAL_TABLET | Freq: Every day | ORAL | 1 refills | Status: DC

## 2022-01-05 NOTE — Assessment & Plan Note (Signed)
Blood pressures pretty elevated today.  It was actually little better and ready to do orthostatics.  But I am can make a change to decrease her HCTZ to 12.5 and add amlodipine 2.5 just to see if we might be over diuresing her but also to maintain her blood pressure control .  ?

## 2022-01-05 NOTE — Progress Notes (Addendum)
? ?Acute Office Visit ? ?Subjective:  ? ?  ?Patient ID: [name], female    DOB: Nov 19, 1940, 81 y.o.   MRN: [id number] ? ?Chief Complaint  ?Patient presents with  ? Loss Of Balance   ?  Patient stated she was feeling lightheaded but the lightheadedness as resolved. Patient states she still feels like her balance is off.   ? ? ?HPI ?Patient is in today for feeling lightheaded that started over the weekend.  She says it does not feel like she is got a pass out she just feels like she is off balance.  She describes it as "lightheaded".  She does not notice any difference if she rolls over in bed.  But notices it when she stands up but then it does seem to persist.  She says she felt like she was going to "sway or fall".  She says the lightheadedness sensation actually seems to be better but she feels like her gait is still off she still wanting to walk to the right and feels like she is having balance issues that does not seem to be improving.  In fact she is using her walker because she is nervous that she is going to fall. ? ?She denies any recent fever or upper respiratory symptoms. ? ?She did bring in a log of about 5 blood pressures from home they all look pretty good honestly.  Pulse typically in the 50s or 60s. ? ?ROS ? ? ?   ?Objective:  ?  ?BP (!) 181/69   Pulse (!) 56   Resp 18   Ht 5\' 5"  (1.651 m)   Wt 156 lb 14.4 oz (71.2 kg)   SpO2 99%   BMI 26.11 kg/m?  ? ? ?Physical Exam ?Vitals and nursing note reviewed.  ?Constitutional:   ?   Appearance: She is well-developed.  ?HENT:  ?   Head: Normocephalic and atraumatic.  ?   Right Ear: External ear normal.  ?   Left Ear: External ear normal.  ?   Nose: Nose normal.  ?Eyes:  ?   Conjunctiva/sclera: Conjunctivae normal.  ?   Pupils: Pupils are equal, round, and reactive to light.  ?Neck:  ?   Thyroid: No thyromegaly.  ?Cardiovascular:  ?   Rate and Rhythm: Normal rate and regular rhythm.  ?   Heart sounds: Normal heart sounds.  ?Pulmonary:  ?   Effort:  Pulmonary effort is normal.  ?   Breath sounds: Normal breath sounds. No wheezing.  ?Musculoskeletal:  ?   Cervical back: Neck supple.  ?Lymphadenopathy:  ?   Cervical: No cervical adenopathy.  ?Skin: ?   General: Skin is warm and dry.  ?Neurological:  ?   Mental Status: She is alert and oriented to person, place, and time.  ?   Comments: I was able to do a Dix-Hallpike laying her flat I was not able to actually extend her head over the edge of the bed.  But it did not stimulate or recreate any symptoms.  She said she felt a little lightheaded though after she sat up for a second or 2.  ?Psychiatric:     ?   Behavior: Behavior normal.  ? ? ?No results found for any visits on 01/05/22. ? ? ?   ?Assessment & Plan:  ? ?Problem List Items Addressed This Visit   ? ?  ? Cardiovascular and Mediastinum  ? Essential hypertension  ?  Blood pressures pretty elevated today.  It was  actually little better and ready to do orthostatics.  But I am can make a change to decrease her HCTZ to 12.5 and add amlodipine 2.5 just to see if we might be over diuresing her but also to maintain her blood pressure control .  ? ?  ?  ? Relevant Medications  ? amLODipine (NORVASC) 2.5 MG tablet  ? ?Other Visit Diagnoses   ? ? Lightheadedness    -  Primary  ? Gait instability      ? ?  ? ?Lightheadedness-it does not sound consistent with near syncope.  It does seem to be triggered with position change but orthostatics were normal.  We discussed further work-up with possible brain MRI to rule out stroke etc.  She also fell about a month ago and injured herself.  She might benefit from formal physical therapy but again we will work-up further at this point in time.  She has had no recent fever or respiratory symptoms. ? ?Did want a make a slight adjustment to her blood pressure regimen.  She is on Jardiance and HCTZ so it could be over diuresing her would like to cut back on the HCTZ to 12.5 and add 2.5 of amlodipine instead she gets angioedema with  ARB's.  And she is already on carvedilol. ?Meds ordered this encounter  ?Medications  ? amLODipine (NORVASC) 2.5 MG tablet  ?  Sig: Take 1 tablet (2.5 mg total) by mouth daily.  ?  Dispense:  30 tablet  ?  Refill:  1  ? ? ?No follow-ups on file. ? ?Beatrice Lecher, MD ? ? ?

## 2022-01-05 NOTE — Patient Instructions (Signed)
For you to cut your hydrochlorothiazide in half and take a half a tab daily. ?Get a send over a new prescription called amlodipine to take.  I am going to try to take you completely off of the hydrochlorothiazide to see if that helps with some of the lightheadedness that you are experiencing. ?At also like to get a brain MRI for further work-up so we will work on getting approval with your insurance and getting that scheduled. ? ?

## 2022-01-12 ENCOUNTER — Other Ambulatory Visit: Payer: Self-pay | Admitting: Family Medicine

## 2022-01-12 DIAGNOSIS — E1149 Type 2 diabetes mellitus with other diabetic neurological complication: Secondary | ICD-10-CM

## 2022-01-24 ENCOUNTER — Encounter: Payer: Self-pay | Admitting: Family Medicine

## 2022-01-24 ENCOUNTER — Ambulatory Visit: Payer: Medicare PPO | Admitting: Family Medicine

## 2022-01-24 VITALS — BP 145/57 | HR 44 | Resp 16 | Ht 65.0 in | Wt 158.0 lb

## 2022-01-24 DIAGNOSIS — Z794 Long term (current) use of insulin: Secondary | ICD-10-CM | POA: Diagnosis not present

## 2022-01-24 DIAGNOSIS — R55 Syncope and collapse: Secondary | ICD-10-CM | POA: Diagnosis not present

## 2022-01-24 DIAGNOSIS — I10 Essential (primary) hypertension: Secondary | ICD-10-CM | POA: Diagnosis not present

## 2022-01-24 DIAGNOSIS — R2681 Unsteadiness on feet: Secondary | ICD-10-CM | POA: Diagnosis not present

## 2022-01-24 DIAGNOSIS — E1140 Type 2 diabetes mellitus with diabetic neuropathy, unspecified: Secondary | ICD-10-CM | POA: Diagnosis not present

## 2022-01-24 DIAGNOSIS — R42 Dizziness and giddiness: Secondary | ICD-10-CM | POA: Diagnosis not present

## 2022-01-24 LAB — POCT GLYCOSYLATED HEMOGLOBIN (HGB A1C): Hemoglobin A1C: 6.8 % — AB (ref 4.0–5.6)

## 2022-01-24 NOTE — Assessment & Plan Note (Signed)
Pressure looks much better today compared to previous. ?

## 2022-01-24 NOTE — Progress Notes (Signed)
? ?Acute Office Visit ? ?Subjective:  ? ?  ?Patient ID: [name], female    DOB: Mar 22, 1941, 81 y.o.   MRN: [id number] ? ?Chief Complaint  ?Patient presents with  ? Follow-up  ?  F/u on lightheadness and bp medication adjustment. Patient stated the lightheadness is better.  ?  ? Postnasal drainage   ?  Nasal congestion, 1 day   ? ? ?HPI ?Patient is in today for  ? ?F/u on lightheadness and bp medication adjustment. Patient stated the lightheadness is better.  We decreased her HCTZ to 12.5 mg and she is also on Jardiance.  I felt like there could be some component of overdiuresis.  And we added 2.5 mg of amlodipine to compensate for the decreased dose and the HCTZ.  He says she feels about 30 to 40% better than she did with the changes in her medication regimen she has not fallen again.  She has not heard back yet on the echocardiogram. ? ?Nasal congestion  x 1 days.  ? ?Reports home blood pressures have mostly been running in the 140s.  She denies any side effects of the new medication.  No swelling. ? ?Diabetes - no hypoglycemic events. No wounds or sores that are not healing well. No increased thirst or urination. Checking glucose at home. Taking medications as prescribed without any side effects. ? ? ?ROS ? ? ?   ?Objective:  ?  ?BP (!) 145/57   Pulse (!) 44   Resp 16   Ht 5\' 5"  (1.651 m)   Wt 158 lb (71.7 kg)   SpO2 100%   BMI 26.29 kg/m?  ? ? ?Physical Exam ?Vitals and nursing note reviewed.  ?Constitutional:   ?   Appearance: She is well-developed.  ?HENT:  ?   Head: Normocephalic and atraumatic.  ?Cardiovascular:  ?   Rate and Rhythm: Normal rate and regular rhythm.  ?   Heart sounds: Normal heart sounds.  ?Pulmonary:  ?   Effort: Pulmonary effort is normal.  ?   Breath sounds: Normal breath sounds.  ?Skin: ?   General: Skin is warm and dry.  ?Neurological:  ?   Mental Status: She is alert and oriented to person, place, and time.  ?Psychiatric:     ?   Behavior: Behavior normal.  ? ? ?Results for  orders placed or performed in visit on 01/24/22  ?POCT HgB A1C  ?Result Value Ref Range  ? Hemoglobin A1C 6.8 (A) 4.0 - 5.6 %  ? HbA1c POC (<> result, manual entry)    ? HbA1c, POC (prediabetic range)    ? HbA1c, POC (controlled diabetic range)    ? ? ? ?   ?Assessment & Plan:  ? ?Problem List Items Addressed This Visit   ? ?  ? Cardiovascular and Mediastinum  ? Essential hypertension  ?  Pressure looks much better today compared to previous. ? ?  ?  ?  ? Endocrine  ? Type 2 diabetes mellitus with diabetic neuropathy, unspecified ([place])  ?  A1c looks good today. ? ?  ?  ? Relevant Orders  ? POCT HgB A1C (Completed)  ? ?Other Visit Diagnoses   ? ? Lightheadedness    -  Primary  ? Relevant Orders  ? LONG TERM MONITOR (3-14 DAYS)  ? Gait instability      ? Near syncope      ? Relevant Orders  ? LONG TERM MONITOR (3-14 DAYS)  ? ?  ? ?Lightheadedness-about 30  to 40% improved with reducing her HCTZ.  We discussed holding it completely for the next week until I see her back for her regular scheduled appointment.  We can see if she continues to feel like she is improving blood pressure may go up slightly so we may still have to adjust the amlodipine.  So far she is tolerating the amlodipine 2.5 mg well without any side effects or swelling. Consider heart monitor as well. Her HR in the 40s today which is low for her, I wouldn't suspect 2.5 grams of amlodipine will drop her pulse 10 points.  Consider that she might be actually having some bradycardic episodes that could actually be causing some of her lightheadedness. ? ?No orders of the defined types were placed in this encounter. ? ? ?No follow-ups on file. ? ?Beatrice Lecher, MD ? ? ?

## 2022-01-24 NOTE — Patient Instructions (Signed)
Hold your hydrochlorothiazide completely until I see you back next week.  We will see if you continue to feel better or not. ?

## 2022-01-24 NOTE — Assessment & Plan Note (Signed)
A1c looks good today. ?

## 2022-01-30 ENCOUNTER — Ambulatory Visit: Payer: Medicare PPO | Admitting: Family Medicine

## 2022-01-30 ENCOUNTER — Encounter: Payer: Self-pay | Admitting: Family Medicine

## 2022-01-30 VITALS — BP 114/54 | HR 55 | Ht 65.0 in | Wt 159.0 lb

## 2022-01-30 DIAGNOSIS — I10 Essential (primary) hypertension: Secondary | ICD-10-CM | POA: Diagnosis not present

## 2022-01-30 DIAGNOSIS — R001 Bradycardia, unspecified: Secondary | ICD-10-CM

## 2022-01-30 DIAGNOSIS — E1149 Type 2 diabetes mellitus with other diabetic neurological complication: Secondary | ICD-10-CM

## 2022-01-30 DIAGNOSIS — R42 Dizziness and giddiness: Secondary | ICD-10-CM | POA: Diagnosis not present

## 2022-01-30 LAB — POCT UA - MICROALBUMIN
Creatinine, POC: 100 mg/dL
Microalbumin Ur, POC: 80 mg/L

## 2022-01-30 NOTE — Assessment & Plan Note (Signed)
Blood pressure looks fantastic on just the 2.5 mg of amlodipine without the HCTZ.  We will continue to monitor carefully she has been getting good blood pressures usually in the 130s at home. ?

## 2022-01-30 NOTE — Assessment & Plan Note (Addendum)
Well controlled. Continue current regimen.  Due for urine microalbumin so we will try to collect that today.  Follow up in  6 mo  ? ?Lab Results  ?Component Value Date  ? HGBA1C 6.8 (A) 01/24/2022  ? ? ?

## 2022-01-30 NOTE — Progress Notes (Signed)
? ?Established Patient Office Visit ? ?Subjective   ?Patient ID: [name], female    DOB: 1941-03-18  Age: 81 y.o. MRN: [id number] ? ?No chief complaint on file. ? ? ?HPI ? ?F/U HTN and dizziness-we held her HCTZ completely.  We had initially drop the dose back and she actually did feel like her dizziness improved so we decided to hold it completely until I saw her back today. She is better but she is still having occasional episodes of vertigo.  She reports she still having some occasional headaches as well. ? ?She has not heard back about the echocardiogram yet. ? ? ? ?ROS ? ?  ?Objective:  ?  ? ?BP (!) 114/54   Pulse (!) 55   Ht 5\' 5"  (1.651 m)   Wt 159 lb (72.1 kg)   SpO2 100%   BMI 26.46 kg/m?  ? ? ?Physical Exam ?Vitals and nursing note reviewed.  ?Constitutional:   ?   Appearance: She is well-developed.  ?HENT:  ?   Head: Normocephalic and atraumatic.  ?Cardiovascular:  ?   Rate and Rhythm: Normal rate and regular rhythm.  ?   Heart sounds: Normal heart sounds.  ?Pulmonary:  ?   Effort: Pulmonary effort is normal.  ?   Breath sounds: Normal breath sounds.  ?Skin: ?   General: Skin is warm and dry.  ?Neurological:  ?   Mental Status: She is alert and oriented to person, place, and time.  ?Psychiatric:     ?   Behavior: Behavior normal.  ? ? ? ?Results for orders placed or performed in visit on 01/30/22  ?POCT UA - Microalbumin  ?Result Value Ref Range  ? Microalbumin Ur, POC 80 mg/L  ? Creatinine, POC 100 mg/dL  ? Albumin/Creatinine Ratio, Urine, POC 30-300   ? ? ? ? ?The ASCVD Risk score (Arnett DK, et al., 2019) failed to calculate for the following reasons: ?  The 2019 ASCVD risk score is only valid for ages 85 to 54 ? ?  ?Assessment & Plan:  ? ?Problem List Items Addressed This Visit   ? ?  ? Cardiovascular and Mediastinum  ? Essential hypertension  ?  Blood pressure looks fantastic on just the 2.5 mg of amlodipine without the HCTZ.  We will continue to monitor carefully she has been getting  good blood pressures usually in the 130s at home. ? ?  ?  ? Relevant Orders  ? POCT UA - Microalbumin (Completed)  ?  ? Endocrine  ? 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)  ?  Well controlled. Continue current regimen.  Due for urine microalbumin so we will try to collect that today.  Follow up in  6 mo  ? ?Lab Results  ?Component Value Date  ? HGBA1C 6.8 (A) 01/24/2022  ? ? ?  ?  ? Relevant Orders  ? POCT UA - Microalbumin (Completed)  ? ?Other Visit Diagnoses   ? ? Bradycardia    -  Primary  ? Vertigo      ? Relevant Orders  ? Ambulatory referral to Physical Therapy  ? ?  ? ? ?Still having some dizziness and vertigo this would like to refer her to vestibular rehab. ? ?Bradycardia-her pulse does look better today we discussed that 50s is definitely acceptable I just do not want dropping into the 40s. ? ?She is still not been scheduled for her echocardiogram.  We did verify that it has been approved with the insurance.  But she has not heard from [facility] about an appointment but time or date I do want to make sure that we get this is part of her work-up for her dizziness/vertigo. ? ?Return in about 3 months (around 05/02/2022) for Diabetes follow-up.  ? ? ?Nani Gasser, MD ? ?

## 2022-02-03 ENCOUNTER — Ambulatory Visit: Payer: Medicare PPO | Admitting: Podiatry

## 2022-02-07 ENCOUNTER — Ambulatory Visit: Payer: Medicare PPO | Attending: Family Medicine | Admitting: Physical Therapy

## 2022-02-07 ENCOUNTER — Encounter: Payer: Self-pay | Admitting: Physical Therapy

## 2022-02-07 DIAGNOSIS — R42 Dizziness and giddiness: Secondary | ICD-10-CM | POA: Insufficient documentation

## 2022-02-07 DIAGNOSIS — M6281 Muscle weakness (generalized): Secondary | ICD-10-CM

## 2022-02-07 DIAGNOSIS — R2689 Other abnormalities of gait and mobility: Secondary | ICD-10-CM

## 2022-02-07 NOTE — Patient Instructions (Signed)
Access Code: [email or web address] URL: [email or web address] Date: 02/07/2022 Prepared by: Reggy Eye  Exercises - Heel Toe Raises with Counter Support  - 1 x daily - 7 x weekly - 3 sets - 10 reps - Standing Knee Flexion with Counter Support  - 1 x daily - 7 x weekly - 3 sets - 10 reps - Standing Hip Abduction with Unilateral Counter Support  - 1 x daily - 7 x weekly - 3 sets - 10 reps

## 2022-02-07 NOTE — Therapy (Signed)
[facility] [place] [address] [place], [place], [address] Phone: [phone]   Fax:  [phone]  Physical Therapy Evaluation  Patient Details  Name: [name] MRN: [id number] Date of Birth: 12-May-1941 Referring Provider ([name]): Beatrice Lecher   Encounter Date: 02/07/2022   [name] End of Session - 02/07/22 1143     Visit Number 1    Number of Visits 12    Date for [name] Re-Evaluation 03/21/22    Authorization Type medicare    Authorization - Visit Number 1    Progress Note Due on Visit 10    [name] Start Time 1100    [name] Stop Time 1143    [name] Time Calculation (min) 43 min    Equipment Utilized During Treatment Gait belt    Activity Tolerance Patient tolerated treatment well    Behavior During Therapy WFL for tasks assessed/performed             Past Medical History:  Diagnosis Date   Arthritis    "back; left hip; knees" (12/04/2013)   Asthma    Chronic bronchitis ([place])    "usually get it twice/yr" (12/04/2013)   Diabetes mellitus without complication ([place])    Environmental and seasonal allergies    GERD (gastroesophageal reflux disease)    High cholesterol    Hypertension    pcp   [facility]      Pneumonia    as a child    Past Surgical History:  Procedure Laterality Date   ABDOMINAL HYSTERECTOMY     BLADDER SUSPENSION     COLONOSCOPY     JOINT REPLACEMENT     SHOULDER ARTHROSCOPY W/ ROTATOR CUFF REPAIR Right    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   Procedure: TOTAL HIP ARTHROPLASTY;  Surgeon: Newt Minion, MD;  Location: [facility];  Service: Orthopedics;  Laterality: Left;  Left Total Hip Arthroplasty   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   Procedure: TOTAL KNEE ARTHROPLASTY;  Surgeon: Newt Minion, MD;  Location: [facility];  Service: Orthopedics;  Laterality: Right;  Right Total Knee Arthroplasty    There were no vitals filed for this visit.    Subjective  Assessment - 02/07/22 1101     Subjective [name] had fall 11/2021 and hit her head. Since that time [name] has been having "lightheadedness" and "my balance is off". [name] has also had recent changes in BP meds which have helped to decrease some of the symptoms but [name] states she still has "lightheadednes every once in a while". Symptoms increase with bending forward, no problems with rolling in bed or driving. She states her balance has "been off" for years due to neuropathy in feet. She uses [facility] for community ambulation    Pertinent History neuropathy bilat feet    Limitations Walking    Diagnostic tests CT and xrays negative for fracture    Patient Stated Goals improving balance, decrease lightheadedness    Currently in Pain? No/denies                [facility] [name] Assessment - 02/07/22 0001       Assessment   Medical Diagnosis Vertigo    Referring Provider ([name]) Beatrice Lecher    Onset Date/Surgical Date 12/11/21    Next MD Visit PRN      Precautions   Precautions None      Restrictions   Weight Bearing Restrictions No  Balance Screen   Has the patient fallen in the past 6 months Yes    How many times? 1    Has the patient had a decrease in activity level because of a fear of falling?  No    Is the patient reluctant to leave their home because of a fear of falling?  No      Home Environment   Additional Comments lives alone, able to live on 1 level      Prior Function   Level of Independence Independent    Vocation Requirements retired      Observation/Other Assessments   Focus on Therapeutic Outcomes (FOTO)  not performed this visit      ROM / Strength   AROM / PROM / Strength Strength      Strength   Overall Strength Comments bilat LE strength grossly 3/5      Ambulation/Gait   Ambulation Distance (Feet) 110 Feet    Assistive device Straight cane    Gait velocity decreased      Standardized Balance Assessment   Standardized Balance Assessment Berg Balance Test       Berg Balance Test   Sit to Stand Able to stand  independently using hands    Standing Unsupported Able to stand 2 minutes with supervision    Sitting with Back Unsupported but Feet Supported on Floor or Stool Able to sit safely and securely 2 minutes    Stand to Sit Controls descent by using hands    Transfers Able to transfer safely, definite need of hands    Standing Unsupported with Eyes Closed Able to stand 3 seconds    Standing Unsupported with Feet Together Needs help to attain position and unable to hold for 15 seconds    From Standing, Reach Forward with Outstretched Arm Can reach forward >5 cm safely (2")    From Standing Position, Pick up Object from Floor Unable to try/needs assist to keep balance    From Standing Position, Turn to Look Behind Over each Shoulder Turn sideways only but maintains balance    Turn 360 Degrees Needs assistance while turning    Standing Unsupported, Alternately Place Feet on Step/Stool Needs assistance to keep from falling or unable to try    Standing Unsupported, One Foot in [employer] balance while stepping or standing    Standing on One Leg Unable to try or needs assist to prevent fall    Total Score 22                    Vestibular Assessment - 02/07/22 0001       Symptom Behavior   Type of Dizziness  Lightheadedness    Frequency of Dizziness comes and goes    Duration of Dizziness <30 seconds    Symptom Nature Variable    Aggravating Factors Forward bending    Relieving Factors Head stationary    Progression of Symptoms Better      Oculomotor Exam   Oculomotor Alignment Normal    Ocular ROM normal    Spontaneous Absent    Gaze-induced  Age appropriate nystagmus at end range    Smooth Pursuits Intact    Saccades Intact;Slow      Vestibulo-Ocular Reflex   VOR 1 Head Only (x 1 viewing) max cuing, unable to perform horizontally, WFL vertically      Positional Testing   Dix-Hallpike Dix-Hallpike Right;Dix-Hallpike Left     Sidelying Test Sidelying Right;Sidelying Left  Horizontal Canal Testing Horizontal Canal Right;Horizontal Canal Left      Dix-Hallpike Right   Dix-Hallpike Right Duration none    Dix-Hallpike Right Symptoms No nystagmus      Dix-Hallpike Left   Dix-Hallpike Left Duration none    Dix-Hallpike Left Symptoms No nystagmus      Horizontal Canal Right   Horizontal Canal Right Duration none    Horizontal Canal Right Symptoms Normal      Horizontal Canal Left   Horizontal Canal Left Duration none    Horizontal Canal Left Symptoms Normal                Objective measurements completed on examination: See above findings.       [place] Adult [name] Treatment/Exercise - 02/07/22 0001       Exercises   Exercises Knee/Hip      Knee/Hip Exercises: Standing   Heel Raises 10 reps    Heel Raises Limitations heel/toe    Knee Flexion 10 reps    Hip Abduction 10 reps                     [name] Education - 02/07/22 1137     Education Details [name] POC and goals, HEP    Person(s) Educated Patient    Methods Explanation;Demonstration;Handout    Comprehension Verbalized understanding;Returned demonstration                 [name] Long Term Goals - 02/07/22 1147       [name] LONG TERM GOAL #1   Title [name] will be independent with HEP    Time 6    Period Weeks    Status New    Target Date 03/21/22      [name] LONG TERM GOAL #2   Title [name] will improve Berg balance score to >= 40/56 to demo decreased risk of falls    Time 6    Period Weeks    Status New    Target Date 03/21/22      [name] LONG TERM GOAL #3   Title [name] will improve bilat LE strength to 4/5 to improve gait and activity tolerance    Time 6    Period Weeks    Status New    Target Date 03/21/22                    Plan - 02/07/22 1143     Clinical Impression Statement [name] is an 81 y/o female referred for vertigo and impaired balance. [name] presents with impaired balance as noted by 22/56 on Berg balance  test, decreased bilat LE Strength and impaired gait. [name] will benefit from skilled [name] to address deficits and improve balance and decrease risk of falls    Personal Factors and Comorbidities Age;Time since onset of injury/illness/exacerbation;Fitness    Examination-Activity Limitations [place];Locomotion Level;Stairs;Squat    Examination-Participation Restrictions Psychologist, clinical;Shop    Stability/Clinical Decision Making Evolving/Moderate complexity    Clinical Decision Making Moderate    Rehab Potential Good    [name] Frequency 2x / week    [name] Duration 6 weeks    [name] Treatment/Interventions Vestibular;Canalith Repostioning;Aquatic Therapy;Gait training;Stair training;Neuromuscular re-education;Balance training;Therapeutic exercise;Therapeutic activities;Patient/family education    [name] Next Visit Plan assess HEP, progress balance, vestibular if needed    [name] Home Exercise Plan [id number]    Consulted and Agree with Plan of Care Patient             Patient will benefit  from skilled therapeutic intervention in order to improve the following deficits and impairments:  Decreased strength, Decreased activity tolerance, Decreased coordination, Difficulty walking, Decreased balance  Visit Diagnosis: Balance disorder - Plan: [name] plan of care cert/re-cert  Muscle weakness (generalized) - Plan: [name] plan of care cert/re-cert  Other abnormalities of gait and mobility - Plan: [name] plan of care cert/re-cert     Problem List Patient Active Problem List   Diagnosis Date Noted   Type 2 diabetes mellitus with diabetic neuropathy, unspecified ([place]) 04/15/2021   DDD (degenerative disc disease), cervical 03/03/2020   BMI 26.0-26.9,adult 05/01/2019   Chronic kidney disease (CKD), stage III (moderate) ([place]) 03/30/2019   Primary open angle glaucoma (POAG) of left eye, mild stage 11/05/2018   Combined forms of age-related cataract of right eye 12/31/2014   S/P total hip arthroplasty  12/03/2013   Presence of orthopedic joint implant 12/03/2013   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[place]) 01/18/2012   DDD (degenerative disc disease), lumbar 01/05/2012   Osteopenia 07/18/2010   Asthma in adult 07/18/2010   Backache 11/01/2009   Anemia, unspecified 09/14/2009   VITAMIN D DEFICIENCY 09/02/2009   Hyperlipidemia 09/02/2009   Essential hypertension 09/02/2009   ALLERGIC RHINITIS 09/02/2009   REFLUX ESOPHAGITIS 09/02/2009   URINARY INCONTINENCE, 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    [name], [name] 02/07/2022, 3:43 PM  [facility] [place] [address] [place], [place], [address] Phone: [phone]   Fax:  [phone]  Name: [name] MRN: [id number] Date of Birth: 1941-03-26

## 2022-02-16 ENCOUNTER — Ambulatory Visit: Payer: Medicare PPO | Attending: Family Medicine | Admitting: Physical Therapy

## 2022-02-16 DIAGNOSIS — R2689 Other abnormalities of gait and mobility: Secondary | ICD-10-CM | POA: Insufficient documentation

## 2022-02-16 DIAGNOSIS — M6281 Muscle weakness (generalized): Secondary | ICD-10-CM | POA: Insufficient documentation

## 2022-02-16 NOTE — Therapy (Signed)
[facility] [place], [place], [address] Phone: [phone]   Fax:  [phone]  Physical Therapy Treatment  Patient Details  Name: [name] MRN: [id number] Date of Birth: Apr 28, 1941 Referring Provider (PT): Beatrice Lecher   Encounter Date: 02/16/2022   PT End of Session - 02/16/22 0931     Visit Number 2    Number of Visits 12    Date for PT Re-Evaluation 03/21/22    Authorization Type medicare    Authorization - Visit Number 2    Progress Note Due on Visit 10    PT Start Time 0931    PT Stop Time 1015    PT Time Calculation (min) 44 min    Equipment Utilized During Treatment Gait belt    Activity Tolerance Patient tolerated treatment well    Behavior During Therapy WFL for tasks assessed/performed             Past Medical History:  Diagnosis Date   Arthritis    "back; left hip; knees" (12/04/2013)   Asthma    Chronic bronchitis ([place])    "usually get it twice/yr" (12/04/2013)   Diabetes mellitus without complication ([place])    Environmental and seasonal allergies    GERD (gastroesophageal reflux disease)    High cholesterol    Hypertension    pcp   james manning      Pneumonia    as a child    Past Surgical History:  Procedure Laterality Date   ABDOMINAL HYSTERECTOMY     BLADDER SUSPENSION     COLONOSCOPY     JOINT REPLACEMENT     SHOULDER ARTHROSCOPY W/ ROTATOR CUFF REPAIR Right    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   Procedure: TOTAL HIP ARTHROPLASTY;  Surgeon: Newt Minion, MD;  Location: [place] [facility];  Service: Orthopedics;  Laterality: Left;  Left Total Hip Arthroplasty   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   Procedure: TOTAL KNEE ARTHROPLASTY;  Surgeon: Newt Minion, MD;  Location: [place];  Service: Orthopedics;  Laterality: Right;  Right Total Knee Arthroplasty    There were no vitals filed for this visit.   Subjective  Assessment - 02/16/22 0933     Subjective Pt reports she's doing pretty good. Pt has been doing her exercises. Pt states she has had 2 instances of swimmy headedness -- thinks it may be related to her new BP meds.    Pertinent History neuropathy bilat feet    Limitations Walking    Diagnostic tests CT and xrays negative for fracture    Patient Stated Goals improving balance, decrease lightheadedness    Currently in Pain? No/denies                               [facility] Adult PT Treatment/Exercise - 02/16/22 0001       Knee/Hip Exercises: Aerobic   Nustep L5 x 5 min UEs/LEs      Knee/Hip Exercises: Standing   Heel Raises Both;2 sets;10 reps    Heel Raises Limitations heel/toe    Knee Flexion Strengthening;Right;Left;2 sets;10 reps    Hip Abduction Stengthening;Right;Left;20 reps;Knee straight    Hip Extension Stengthening;Right;Left;2 sets;10 reps    Other Standing Knee Exercises Slow marching 2x10 with counter support                 Balance Exercises - 02/16/22 0001  Balance Exercises: Standing   Standing Eyes Opened Narrow base of support (BOS);Solid surface;30 secs;Head turns   2x10 head turns and then head nods; feet apart on compliant surface 2x30 sec; feet apart on compliant surface with alternating UE flexion 2x10; feet apart on compliant surface palloff press with red med ball 2x10   Standing Eyes Closed Solid surface;30 secs;2 reps    Other Standing Exercises step tap on 4" step x10 each                     PT Long Term Goals - 02/07/22 1147       PT LONG TERM GOAL #1   Title Pt will be independent with HEP    Time 6    Period Weeks    Status New    Target Date 03/21/22      PT LONG TERM GOAL #2   Title Pt will improve Berg balance score to >= 40/56 to demo decreased risk of falls    Time 6    Period Weeks    Status New    Target Date 03/21/22      PT LONG TERM GOAL #3   Title Pt will improve bilat LE strength to  4/5 to improve gait and activity tolerance    Time 6    Period Weeks    Status New    Target Date 03/21/22                   Plan - 02/16/22 0942     Clinical Impression Statement Treatment focused on reviewing and updating accordingly. Worked primarily on static standing balance with good pt tolerance.    Personal Factors and Comorbidities Age;Time since onset of injury/illness/exacerbation;Fitness    Examination-Activity Limitations [place];Locomotion Level;Stairs;Squat    Examination-Participation Restrictions Building surveyor;Shop    Stability/Clinical Decision Making Evolving/Moderate complexity    Rehab Potential Good    PT Frequency 2x / week    PT Duration 6 weeks    PT Treatment/Interventions Vestibular;Canalith Repostioning;Aquatic Therapy;Gait training;Stair training;Neuromuscular re-education;Balance training;Therapeutic exercise;Therapeutic activities;Patient/family education    PT Next Visit Plan assess HEP, progress balance, vestibular if needed    PT Home Exercise Plan [email or web address]    Consulted and Agree with Plan of Care Patient             Patient will benefit from skilled therapeutic intervention in order to improve the following deficits and impairments:  Decreased strength, Decreased activity tolerance, Decreased coordination, Difficulty walking, Decreased balance  Visit Diagnosis: Balance disorder  Muscle weakness (generalized)  Other abnormalities of gait and mobility     Problem List Patient Active Problem List   Diagnosis Date Noted   Type 2 diabetes mellitus with diabetic neuropathy, unspecified (HCC) 04/15/2021   DDD (degenerative disc disease), cervical 03/03/2020   BMI 26.0-26.9,adult 05/01/2019   Chronic kidney disease (CKD), stage III (moderate) (HCC) 03/30/2019   Primary open angle glaucoma (POAG) of left eye, mild stage 11/05/2018   Combined forms of age-related cataract of right eye 12/31/2014   S/P  total hip arthroplasty 12/03/2013   Presence of orthopedic joint implant 12/03/2013   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) 01/18/2012   DDD (degenerative disc disease), lumbar 01/05/2012   Osteopenia 07/18/2010   Asthma in adult 07/18/2010   Backache 11/01/2009   Anemia, unspecified 09/14/2009   VITAMIN D DEFICIENCY 09/02/2009   Hyperlipidemia 09/02/2009   Essential hypertension 09/02/2009   ALLERGIC RHINITIS 09/02/2009  REFLUX ESOPHAGITIS 09/02/2009   URINARY INCONTINENCE, 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    [facility] April Ma L Mamadou Breon, PT, DPT  02/16/2022, 10:11 AM  [facility] [place], [place], [address] Phone: [phone]   Fax:  [phone]  Name: [name] MRN: [id number] Date of Birth: [birth year]

## 2022-02-21 ENCOUNTER — Ambulatory Visit: Payer: Medicare PPO | Admitting: Physical Therapy

## 2022-02-21 DIAGNOSIS — R2689 Other abnormalities of gait and mobility: Secondary | ICD-10-CM

## 2022-02-21 DIAGNOSIS — M6281 Muscle weakness (generalized): Secondary | ICD-10-CM | POA: Diagnosis not present

## 2022-02-21 NOTE — Therapy (Signed)
[place], [place], [address] Phone: [phone]   Fax:  [phone]  Physical Therapy Treatment  Patient Details  Name: [name] MRN: [id number] Date of Birth: Feb 28, 1941 Referring Provider (PT): Beatrice Lecher   Encounter Date: 02/21/2022   PT End of Session - 02/21/22 1011     Visit Number 3    Number of Visits 12    Date for PT Re-Evaluation 03/21/22    Authorization - Visit Number 3    Progress Note Due on Visit 10    PT Start Time 0928    PT Stop Time 1011    PT Time Calculation (min) 43 min    Activity Tolerance Patient tolerated treatment well    Behavior During Therapy [facility] for tasks assessed/performed             Past Medical History:  Diagnosis Date   Arthritis    "back; left hip; knees" (12/04/2013)   Asthma    Chronic bronchitis ([place])    "usually get it twice/yr" (12/04/2013)   Diabetes mellitus without complication ([place])    Environmental and seasonal allergies    GERD (gastroesophageal reflux disease)    High cholesterol    Hypertension    pcp   james manning      Pneumonia    as a child    Past Surgical History:  Procedure Laterality Date   ABDOMINAL HYSTERECTOMY     BLADDER SUSPENSION     COLONOSCOPY     JOINT REPLACEMENT     SHOULDER ARTHROSCOPY W/ ROTATOR CUFF REPAIR Right    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   Procedure: TOTAL HIP ARTHROPLASTY;  Surgeon: Newt Minion, MD;  Location: [place];  Service: Orthopedics;  Laterality: Left;  Left Total Hip Arthroplasty   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   Procedure: TOTAL KNEE ARTHROPLASTY;  Surgeon: Newt Minion, MD;  Location: [place];  Service: Orthopedics;  Laterality: Right;  Right Total Knee Arthroplasty    There were no vitals filed for this visit.   Subjective Assessment - 02/21/22 0929     Subjective Pt reports she continues to be compliant  with HEP. She states she walked a lot this weekend. 1 instance of lightheadedness but it resolved within minutes    Patient Stated Goals improving balance, decrease lightheadedness    Currently in Pain? No/denies                [facility] PT Assessment - 02/21/22 0001       Assessment   Medical Diagnosis Vertigo    Referring Provider (PT) Beatrice Lecher    Onset Date/Surgical Date 12/11/21    Next MD Visit PRN                           [facility] Adult PT Treatment/Exercise - 02/21/22 0001       Knee/Hip Exercises: Aerobic   Nustep L5 x 5 min UEs/LEs      Knee/Hip Exercises: Standing   Heel Raises Both;2 sets;10 reps    Heel Raises Limitations heel/toe, 1.5#    Knee Flexion 2 sets;10 reps    Knee Flexion Limitations 1.5# bilat    Hip Abduction 2 sets;10 reps    Abduction Limitations 1.5# bilat    Hip Extension 2 sets;10 reps    Extension Limitations 1.5# bilat      Knee/Hip  Exercises: Seated   Long Arc Quad Both;2 sets;10 reps    Long Arc Quad Weight --   1.5#                Balance Exercises - 02/21/22 0001       Balance Exercises: Standing   Standing Eyes Opened Narrow base of support (BOS);Head turns;30 secs;2 reps;Solid surface   horizontal 2 x 30 sec, vertical 2 x 30 sec, min A   Standing Eyes Closed Solid surface;30 secs;2 reps    Other Standing Exercises tap ups to 6'' step with min A/HHA x 20 bilat    Other Standing Exercises Comments standing on foam static x 30 sec, on foam head turns horizontal and vertically x 30 sec each, standing on foam alt shld flexion x 10 bilat, standing on foam overhead lift red med ball x 10 min A, on foam chest press red med ball x 10                     PT Long Term Goals - 02/07/22 1147       PT LONG TERM GOAL #1   Title Pt will be independent with HEP    Time 6    Period Weeks    Status New    Target Date 03/21/22      PT LONG TERM GOAL #2   Title Pt will improve Berg balance score  to >= 40/56 to demo decreased risk of falls    Time 6    Period Weeks    Status New    Target Date 03/21/22      PT LONG TERM GOAL #3   Title Pt will improve bilat LE strength to 4/5 to improve gait and activity tolerance    Time 6    Period Weeks    Status New    Target Date 03/21/22                   Plan - 02/21/22 1012     Clinical Impression Statement Pt with good tolerance to progression of balance exercises. most challenged with looking down. Will continue to benefit from balance training to reduce fall risk    PT Next Visit Plan progress static and dynamic balance    PT Home Exercise Plan [id number]    Consulted and Agree with Plan of Care Patient             Patient will benefit from skilled therapeutic intervention in order to improve the following deficits and impairments:     Visit Diagnosis: Balance disorder  Muscle weakness (generalized)  Other abnormalities of gait and mobility     Problem List Patient Active Problem List   Diagnosis Date Noted   Type 2 diabetes mellitus with diabetic neuropathy, unspecified ([place]) 04/15/2021   DDD (degenerative disc disease), cervical 03/03/2020   BMI 26.0-26.9,adult 05/01/2019   Chronic kidney disease (CKD), stage III (moderate) (HCC) 03/30/2019   Primary open angle glaucoma (POAG) of left eye, mild stage 11/05/2018   Combined forms of age-related cataract of right eye 12/31/2014   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   Presence of orthopedic joint implant 12/03/2013   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[place]) 01/18/2012   DDD (degenerative disc disease), lumbar 01/05/2012   Osteopenia 07/18/2010   Asthma in adult 07/18/2010   Backache 11/01/2009   Anemia, unspecified 09/14/2009   VITAMIN D DEFICIENCY 09/02/2009   Hyperlipidemia 09/02/2009   Essential hypertension 09/02/2009  ALLERGIC RHINITIS 09/02/2009   REFLUX ESOPHAGITIS 09/02/2009   URINARY INCONTINENCE, 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     Travante Knee, PT 02/21/2022, 10:13 AM  [facility] [place], [place], [address] Phone: [phone]   Fax:  [phone]  Name: [name] MRN: [id number] Date of Birth: [birth year]

## 2022-02-23 DIAGNOSIS — R55 Syncope and collapse: Secondary | ICD-10-CM | POA: Diagnosis not present

## 2022-02-23 DIAGNOSIS — I10 Essential (primary) hypertension: Secondary | ICD-10-CM | POA: Diagnosis not present

## 2022-02-23 DIAGNOSIS — R001 Bradycardia, unspecified: Secondary | ICD-10-CM | POA: Diagnosis not present

## 2022-02-24 ENCOUNTER — Ambulatory Visit: Payer: Medicare PPO | Admitting: Physical Therapy

## 2022-02-24 DIAGNOSIS — M6281 Muscle weakness (generalized): Secondary | ICD-10-CM

## 2022-02-24 DIAGNOSIS — R2689 Other abnormalities of gait and mobility: Secondary | ICD-10-CM

## 2022-02-24 NOTE — Therapy (Signed)
[place], [place], [address] Phone: [phone]   Fax:  [phone]  Physical Therapy Treatment  Patient Details  Name: [name] MRN: [id number] Date of Birth: 1941-08-28 Referring Provider (PT): Beatrice Lecher   Encounter Date: 02/24/2022 Rationale for Evaluation and Treatment Rehabilitation   PT End of Session - 02/24/22 1009     Visit Number 4    Number of Visits 12    Date for PT Re-Evaluation 03/21/22    Authorization - Visit Number 4    Progress Note Due on Visit 10    PT Start Time 0930    PT Stop Time 1010    PT Time Calculation (min) 40 min    Activity Tolerance Patient tolerated treatment well    Behavior During Therapy [facility] for tasks assessed/performed             Past Medical History:  Diagnosis Date   Arthritis    "back; left hip; knees" (12/04/2013)   Asthma    Chronic bronchitis ([place])    "usually get it twice/yr" (12/04/2013)   Diabetes mellitus without complication (HCC)    Environmental and seasonal allergies    GERD (gastroesophageal reflux disease)    High cholesterol    Hypertension    pcp   james manning      Pneumonia    as a child    Past Surgical History:  Procedure Laterality Date   ABDOMINAL HYSTERECTOMY     BLADDER SUSPENSION     COLONOSCOPY     JOINT REPLACEMENT     SHOULDER ARTHROSCOPY W/ ROTATOR CUFF REPAIR Right    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   Procedure: TOTAL HIP ARTHROPLASTY;  Surgeon: Newt Minion, MD;  Location: [place];  Service: Orthopedics;  Laterality: Left;  Left Total Hip Arthroplasty   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   Procedure: TOTAL KNEE ARTHROPLASTY;  Surgeon: Newt Minion, MD;  Location: [place];  Service: Orthopedics;  Laterality: Right;  Right Total Knee Arthroplasty    There were no vitals filed for this visit.   Subjective Assessment - 02/24/22 0935      Subjective Pt states she is having a "flare up" of Rt hip pain. She is having more difficutly standing and walking today. She states "I will do my best"    Patient Stated Goals improving balance, decrease lightheadedness    Currently in Pain? Yes    Pain Score 6     Pain Location Hip    Pain Orientation Right                [facility] PT Assessment - 02/24/22 0001       Assessment   Medical Diagnosis Vertigo    Referring Provider (PT) Beatrice Lecher    Onset Date/Surgical Date 12/11/21    Next MD Visit PRN                           [facility] Adult PT Treatment/Exercise - 02/24/22 0001       Knee/Hip Exercises: Aerobic   Nustep L5 x 5 min UEs/LEs      Knee/Hip Exercises: Seated   Long Arc Quad Both;2 sets;10 reps    Long Arc Quad Weight --   1.5#                Balance Exercises - 02/24/22 0001  Balance Exercises: Seated   Other Seated Exercises seated on dyna disc: head turns horizontal 2 x 30 sec, laterally 2 x 30 sec, 2kg med ball lift x 20, twist with 2kg med ball x 20, diagonal lifts following with head and eyes x 10 bilat, reaching for objects all directions following with head/eyes    Other Seated Exercises Comments forward flexion fingertips to floor 3 sec hold x 10                     PT Long Term Goals - 02/07/22 1147       PT LONG TERM GOAL #1   Title Pt will be independent with HEP    Time 6    Period Weeks    Status New    Target Date 03/21/22      PT LONG TERM GOAL #2   Title Pt will improve Berg balance score to >= 40/56 to demo decreased risk of falls    Time 6    Period Weeks    Status New    Target Date 03/21/22      PT LONG TERM GOAL #3   Title Pt will improve bilat LE strength to 4/5 to improve gait and activity tolerance    Time 6    Period Weeks    Status New    Target Date 03/21/22                   Plan - 02/24/22 1010     Clinical Impression Statement Pt with increased hip  pain today. Treatment modified to balance in sitting due to pain in hip with standing. Pt challeneged well by head/eye movement following hands with diagonal reaching    PT Next Visit Plan progress static and dynamic balance    PT Home Exercise Plan [id number]    Consulted and Agree with Plan of Care Patient             Patient will benefit from skilled therapeutic intervention in order to improve the following deficits and impairments:     Visit Diagnosis: Balance disorder  Muscle weakness (generalized)  Other abnormalities of gait and mobility     Problem List Patient Active Problem List   Diagnosis Date Noted   Type 2 diabetes mellitus with diabetic neuropathy, unspecified ([place]) 04/15/2021   DDD (degenerative disc disease), cervical 03/03/2020   BMI 26.0-26.9,adult 05/01/2019   Chronic kidney disease (CKD), stage III (moderate) ([place]) 03/30/2019   Primary open angle glaucoma (POAG) of left eye, mild stage 11/05/2018   Combined forms of age-related cataract of right eye 12/31/2014   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   Presence of orthopedic joint implant 12/03/2013   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[place]) 01/18/2012   DDD (degenerative disc disease), lumbar 01/05/2012   Osteopenia 07/18/2010   Asthma in adult 07/18/2010   Backache 11/01/2009   Anemia, unspecified 09/14/2009   VITAMIN D DEFICIENCY 09/02/2009   Hyperlipidemia 09/02/2009   Essential hypertension 09/02/2009   ALLERGIC RHINITIS 09/02/2009   REFLUX ESOPHAGITIS 09/02/2009   URINARY INCONTINENCE, 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    Reida Hem, PT 02/24/2022, 10:15 AM  [facility] [place], [place], [address] Phone: [phone]   Fax:  [phone]  Name: [name] MRN: [id number] Date of Birth: 06-10-1941

## 2022-02-28 ENCOUNTER — Ambulatory Visit: Payer: Medicare PPO | Admitting: Physical Therapy

## 2022-02-28 DIAGNOSIS — M6281 Muscle weakness (generalized): Secondary | ICD-10-CM | POA: Diagnosis not present

## 2022-02-28 DIAGNOSIS — R2689 Other abnormalities of gait and mobility: Secondary | ICD-10-CM | POA: Diagnosis not present

## 2022-02-28 NOTE — Therapy (Signed)
[facility] [place], [place], [address] Phone: [phone]   Fax:  [phone]  Physical Therapy Treatment  Patient Details  Name: [name] MRN: [id number] Date of Birth: 1940-12-17 Referring Provider (PT): Beatrice Lecher   Encounter Date: 02/28/2022 Rationale for Evaluation and Treatment Rehabilitation   PT End of Session - 02/28/22 1434     Visit Number 5    Number of Visits 12    Date for PT Re-Evaluation 03/21/22    Authorization Type medicare    Authorization - Visit Number 5    Progress Note Due on Visit 10    PT Start Time 1352    PT Stop Time 1435    PT Time Calculation (min) 43 min    Equipment Utilized During Treatment Gait belt    Activity Tolerance Patient tolerated treatment well    Behavior During Therapy WFL for tasks assessed/performed             Past Medical History:  Diagnosis Date   Arthritis    "back; left hip; knees" (12/04/2013)   Asthma    Chronic bronchitis ([place])    "usually get it twice/yr" (12/04/2013)   Diabetes mellitus without complication (HCC)    Environmental and seasonal allergies    GERD (gastroesophageal reflux disease)    High cholesterol    Hypertension    pcp   james manning      Pneumonia    as a child    Past Surgical History:  Procedure Laterality Date   ABDOMINAL HYSTERECTOMY     BLADDER SUSPENSION     COLONOSCOPY     JOINT REPLACEMENT     SHOULDER ARTHROSCOPY W/ ROTATOR CUFF REPAIR Right    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   Procedure: TOTAL HIP ARTHROPLASTY;  Surgeon: Newt Minion, MD;  Location: [place];  Service: Orthopedics;  Laterality: Left;  Left Total Hip Arthroplasty   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   Procedure: TOTAL KNEE ARTHROPLASTY;  Surgeon: Newt Minion, MD;  Location: [facility];  Service: Orthopedics;  Laterality: Right;  Right Total Knee Arthroplasty    There  were no vitals filed for this visit.   Subjective Assessment - 02/28/22 1354     Subjective Pt states her Rt hip is feeling better. She states her balance still feels "a little off"    Patient Stated Goals improving balance, decrease lightheadedness    Currently in Pain? No/denies                [facility] PT Assessment - 02/28/22 0001       Assessment   Medical Diagnosis Vertigo    Referring Provider (PT) Beatrice Lecher    Onset Date/Surgical Date 12/11/21    Next MD Visit PRN      Balance   Balance Assessed --   requires min A for head turns on foam                          [facility] Adult PT Treatment/Exercise - 02/28/22 0001       Knee/Hip Exercises: Aerobic   Nustep L5 x 5 min UEs/LEs      Knee/Hip Exercises: Standing   Heel Raises 2 sets;10 reps    Heel Raises Limitations no weight today due to recovering from hip pain    Knee Flexion 2 sets;10 reps    Knee Flexion  Limitations no weight due to hip pain recovery    Hip Abduction 2 sets;10 reps    Abduction Limitations no weight due to hip pain recovery    Hip Extension 2 sets;10 reps    Extension Limitations no weight due to hip pain recovery    Walking with Sports Cord resisted walking green TB bkwd/fwd min A x 5      Knee/Hip Exercises: Seated   Long Arc Quad Both;2 sets;10 reps    Long Arc Quad Weight 2 lbs.                 Balance Exercises - 02/28/22 0001       Balance Exercises: Standing   Other Standing Exercises Comments standing on foam: narrow BOS head turns horizontal x 30 sec, vertical x 30 sec. then on foam semi tandem stance with head turns vertical x 30sec, horizontal 30 sec bilat, on foam chest press, trunk rotation and bilat shoulder flexion with red med ball                     PT Long Term Goals - 02/07/22 1147       PT LONG TERM GOAL #1   Title Pt will be independent with HEP    Time 6    Period Weeks    Status New    Target Date 03/21/22       PT LONG TERM GOAL #2   Title Pt will improve Berg balance score to >= 40/56 to demo decreased risk of falls    Time 6    Period Weeks    Status New    Target Date 03/21/22      PT LONG TERM GOAL #3   Title Pt will improve bilat LE strength to 4/5 to improve gait and activity tolerance    Time 6    Period Weeks    Status New    Target Date 03/21/22                   Plan - 02/28/22 1434     Clinical Impression Statement Pt with 2x occurance of feeling "light headed" during session requiring min A to prevent fall. MD is aware of these "episodes". Exercises progressed back to standing but without weight due to hip pain last session. Added resisted walking with pt requiring min A for balance    PT Next Visit Plan progress static and dynamic balance    PT Home Exercise Plan [id number]    Consulted and Agree with Plan of Care Patient             Patient will benefit from skilled therapeutic intervention in order to improve the following deficits and impairments:     Visit Diagnosis: Balance disorder  Muscle weakness (generalized)  Other abnormalities of gait and mobility     Problem List Patient Active Problem List   Diagnosis Date Noted   Type 2 diabetes mellitus with diabetic neuropathy, unspecified ([place]) 04/15/2021   DDD (degenerative disc disease), cervical 03/03/2020   BMI 26.0-26.9,adult 05/01/2019   Chronic kidney disease (CKD), stage III (moderate) ([place]) 03/30/2019   Primary open angle glaucoma (POAG) of left eye, mild stage 11/05/2018   Combined forms of age-related cataract of right eye 12/31/2014   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   Presence of orthopedic joint implant 12/03/2013   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[place]) 01/18/2012   DDD (degenerative disc disease), lumbar 01/05/2012  Osteopenia 07/18/2010   Asthma in adult 07/18/2010   Backache 11/01/2009   Anemia, unspecified 09/14/2009   VITAMIN D DEFICIENCY  09/02/2009   Hyperlipidemia 09/02/2009   Essential hypertension 09/02/2009   ALLERGIC RHINITIS 09/02/2009   REFLUX ESOPHAGITIS 09/02/2009   URINARY INCONTINENCE, 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    Vahan Wadsworth, PT 02/28/2022, 2:37 PM  [facility] [place], [place], [address] Phone: [phone]   Fax:  [phone]  Name: OLUKEMI CLOTFELTER MRN: [id number] Date of Birth: 12-31-1940

## 2022-03-01 ENCOUNTER — Telehealth: Payer: Self-pay | Admitting: *Deleted

## 2022-03-01 NOTE — Telephone Encounter (Signed)
Pt called and stated that her bp has been elevated for the past week. She stated it has been running in the mid to high 140's on top and bottom has been around 74-86.    Pt currently has a Holter monitor placed this was done 02/23/2022.   Pt scheduled for tomorrow for NV for bp check.

## 2022-03-02 ENCOUNTER — Ambulatory Visit (INDEPENDENT_AMBULATORY_CARE_PROVIDER_SITE_OTHER): Payer: Medicare PPO | Admitting: Medical-Surgical

## 2022-03-02 DIAGNOSIS — I10 Essential (primary) hypertension: Secondary | ICD-10-CM

## 2022-03-02 NOTE — Progress Notes (Signed)
Agree with documentation as below.  ___________________________________________ Zoraya Fiorenza L. Ardythe Klute, DNP, APRN, FNP-BC Primary Care and Sports Medicine [facility]  

## 2022-03-02 NOTE — Progress Notes (Signed)
Spoke w/Dr. Linford Arnold prior to pt's appointment today and she advised that if her systolic bp is greater than 140 to increase her Amlodipine to 5 mg daily.   Pt brought in her home bp cuff the reading was  BP: 178/84 P: 61   I had pt to sit for 10 mins before rechecking her bp again.    Pt was advised to increase the Amlodipine to 5 mg  Continue to monitor her bp at home and write the numbers down  F/u in 2 weeks for NV for bp check  She was advised to call the office if she experiences any SOB,CP,Palpitations,or swelling.   Pt voiced understanding and agreed to this.

## 2022-03-03 ENCOUNTER — Ambulatory Visit: Payer: Medicare PPO | Admitting: Physical Therapy

## 2022-03-03 DIAGNOSIS — R2689 Other abnormalities of gait and mobility: Secondary | ICD-10-CM

## 2022-03-03 DIAGNOSIS — M6281 Muscle weakness (generalized): Secondary | ICD-10-CM | POA: Diagnosis not present

## 2022-03-03 NOTE — Therapy (Signed)
[facility] [place] [address] [place], [place], [address] Phone: [phone]   Fax:  [phone]  Physical Therapy Treatment  Patient Details  Name: [name] MRN: [id number] Date of Birth: 11/22/1940 Referring Provider (PT): Nani Gasser   Encounter Date: 03/03/2022 Rationale for Evaluation and Treatment Rehabilitation   PT End of Session - 03/03/22 1006     Visit Number 6    Number of Visits 12    Date for PT Re-Evaluation 03/21/22    Authorization Type medicare    Authorization - Visit Number 6    Progress Note Due on Visit 10    PT Start Time 0921    PT Stop Time 1005    PT Time Calculation (min) 44 min    Equipment Utilized During Treatment Gait belt    Activity Tolerance Patient tolerated treatment well    Behavior During Therapy WFL for tasks assessed/performed             Past Medical History:  Diagnosis Date   Arthritis    "back; left hip; knees" (12/04/2013)   Asthma    Chronic bronchitis (HCC)    "usually get it twice/yr" (12/04/2013)   Diabetes mellitus without complication (HCC)    Environmental and seasonal allergies    GERD (gastroesophageal reflux disease)    High cholesterol    Hypertension    pcp   james manning      Pneumonia    as a child    Past Surgical History:  Procedure Laterality Date   ABDOMINAL HYSTERECTOMY     BLADDER SUSPENSION     COLONOSCOPY     JOINT REPLACEMENT     SHOULDER ARTHROSCOPY W/ ROTATOR CUFF REPAIR Right    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   Procedure: TOTAL HIP ARTHROPLASTY;  Surgeon: Nadara Mustard, MD;  [facility];  Service: Orthopedics;  Laterality: Left;  Left Total Hip Arthroplasty   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   Procedure: TOTAL KNEE ARTHROPLASTY;  Surgeon: Nadara Mustard, MD;  [facility];  Service: Orthopedics;  Laterality: Right;  Right Total Knee Arthroplasty    There  were no vitals filed for this visit.   Subjective Assessment - 03/03/22 0925     Subjective Pt states her BP has been "running high" but that she is feeling better after seeing MD yesterday.    Patient Stated Goals improving balance, decrease lightheadedness    Currently in Pain? No/denies                               [facility] Adult PT Treatment/Exercise - 03/03/22 0001       Knee/Hip Exercises: Aerobic   Nustep L5 x 5 min UEs/LEs      Knee/Hip Exercises: Standing   Walking with Sports Cord resisted walking all directions red TB x 5      Knee/Hip Exercises: Seated   Sit to Sand 3 sets;5 reps   holding 5#                Balance Exercises - 03/03/22 0001       Balance Exercises: Standing   Gait with Head Turns --   gait with head turns vertical and horizontal with SPC and CGA   Step Over Hurdles / Cones step over pool noodles with SPC and CGA    Other Standing Exercises Comments  standing on foam: narrow BOS head turns horizontal x 30 sec, vertical x 30 sec. then on foam semi tandem stance with head turns vertical x 30sec, horizontal 30 sec bilat                     PT Long Term Goals - 02/07/22 1147       PT LONG TERM GOAL #1   Title Pt will be independent with HEP    Time 6    Period Weeks    Status New    Target Date 03/21/22      PT LONG TERM GOAL #2   Title Pt will improve Berg balance score to >= 40/56 to demo decreased risk of falls    Time 6    Period Weeks    Status New    Target Date 03/21/22      PT LONG TERM GOAL #3   Title Pt will improve bilat LE strength to 4/5 to improve gait and activity tolerance    Time 6    Period Weeks    Status New    Target Date 03/21/22                   Plan - 03/03/22 1006     Clinical Impression Statement Progressed dynamic gait and standing balance. Pt requires CGA with gait with head turns and stepping over obstacles. She will continue to benefit from dynamic gait and  balance challenges    PT Next Visit Plan dynamic gait and balance    PT Home Exercise Plan [id number]    Consulted and Agree with Plan of Care Patient             Patient will benefit from skilled therapeutic intervention in order to improve the following deficits and impairments:     Visit Diagnosis: Balance disorder  Muscle weakness (generalized)  Other abnormalities of gait and mobility     Problem List Patient Active Problem List   Diagnosis Date Noted   Type 2 diabetes mellitus with diabetic neuropathy, unspecified (HCC) 04/15/2021   DDD (degenerative disc disease), cervical 03/03/2020   BMI 26.0-26.9,adult 05/01/2019   Chronic kidney disease (CKD), stage III (moderate) (HCC) 03/30/2019   Primary open angle glaucoma (POAG) of left eye, mild stage 11/05/2018   Combined forms of age-related cataract of right eye 12/31/2014   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   Presence of orthopedic joint implant 12/03/2013   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 [place], [place], [address] Phone: [phone]   Fax:  [phone]  Name: [name] MRN: [id number] Date of Birth: 18-Jul-1941

## 2022-03-07 ENCOUNTER — Ambulatory Visit: Payer: Medicare PPO | Admitting: Physical Therapy

## 2022-03-07 DIAGNOSIS — R2689 Other abnormalities of gait and mobility: Secondary | ICD-10-CM | POA: Diagnosis not present

## 2022-03-07 DIAGNOSIS — M6281 Muscle weakness (generalized): Secondary | ICD-10-CM

## 2022-03-07 NOTE — Therapy (Signed)
[facility] [place] [address] [place], [place], [address] Phone: [phone]   Fax:  [phone]  Physical Therapy Treatment  Patient Details  Name: [name] MRN: [id number] Date of Birth: 05/26/1941 Referring Provider (PT): Nani Gasser   Encounter Date: 03/07/2022 Rationale for Evaluation and Treatment Rehabilitation   PT End of Session - 03/07/22 0924     Visit Number 7    Number of Visits 12    Date for PT Re-Evaluation 03/21/22    Authorization - Visit Number 7    Progress Note Due on Visit 10    PT Start Time 0845    PT Stop Time 0925    PT Time Calculation (min) 40 min    Activity Tolerance Patient tolerated treatment well    Behavior During Therapy [facility] for tasks assessed/performed             Past Medical History:  Diagnosis Date   Arthritis    "back; left hip; knees" (12/04/2013)   Asthma    Chronic bronchitis (HCC)    "usually get it twice/yr" (12/04/2013)   Diabetes mellitus without complication (HCC)    Environmental and seasonal allergies    GERD (gastroesophageal reflux disease)    High cholesterol    Hypertension    pcp   james manning      Pneumonia    as a child    Past Surgical History:  Procedure Laterality Date   ABDOMINAL HYSTERECTOMY     BLADDER SUSPENSION     COLONOSCOPY     JOINT REPLACEMENT     SHOULDER ARTHROSCOPY W/ ROTATOR CUFF REPAIR Right    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   Procedure: TOTAL HIP ARTHROPLASTY;  Surgeon: Nadara Mustard, MD;  [facility];  Service: Orthopedics;  Laterality: Left;  Left Total Hip Arthroplasty   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   Procedure: TOTAL KNEE ARTHROPLASTY;  Surgeon: Nadara Mustard, MD;  [facility];  Service: Orthopedics;  Laterality: Right;  Right Total Knee Arthroplasty    There were no vitals filed for this visit.   Subjective Assessment - 03/07/22 0850      Subjective Pt with no new complaints. States her BP was 129/78 - much better than it had been.    Patient Stated Goals improving balance, decrease lightheadedness    Currently in Pain? No/denies                               [facility] Adult PT Treatment/Exercise - 03/07/22 0001       Knee/Hip Exercises: Aerobic   Recumbent Bike x 5 mins level 1      Knee/Hip Exercises: Standing   Walking with Sports Cord resisted walking all directions red TB x 5    Other Standing Knee Exercises standing cone tap alternating LEs x 10, modified deadlift 5# KB 2 x 10      Knee/Hip Exercises: Seated   Sit to Sand 5 reps;2 sets   holding 5#                Balance Exercises - 03/07/22 0001       Balance Exercises: Standing   Gait with Head Turns --   gait with head turns vertical and horizontal with SPC and CGA   Step Over Hurdles / Cones step over pool noodles with SPC and CGA, step  up 6'' step with SPC and CGA                     PT Long Term Goals - 02/07/22 1147       PT LONG TERM GOAL #1   Title Pt will be independent with HEP    Time 6    Period Weeks    Status New    Target Date 03/21/22      PT LONG TERM GOAL #2   Title Pt will improve Berg balance score to >= 40/56 to demo decreased risk of falls    Time 6    Period Weeks    Status New    Target Date 03/21/22      PT LONG TERM GOAL #3   Title Pt will improve bilat LE strength to 4/5 to improve gait and activity tolerance    Time 6    Period Weeks    Status New    Target Date 03/21/22                   Plan - 03/07/22 0925     Clinical Impression Statement Pt continues to require CGA for dynamic balance activities. Good tolerance to modified dead lift and cone tap today. She is progressing towards goals    PT Next Visit Plan dynamic gait and balance    PT Home Exercise Plan [id number]    Consulted and Agree with Plan of Care Patient             Patient will benefit from  skilled therapeutic intervention in order to improve the following deficits and impairments:     Visit Diagnosis: Balance disorder  Muscle weakness (generalized)  Other abnormalities of gait and mobility     Problem List Patient Active Problem List   Diagnosis Date Noted   Type 2 diabetes mellitus with diabetic neuropathy, unspecified (HCC) 04/15/2021   DDD (degenerative disc disease), cervical 03/03/2020   BMI 26.0-26.9,adult 05/01/2019   Chronic kidney disease (CKD), stage III (moderate) (HCC) 03/30/2019   Primary open angle glaucoma (POAG) of left eye, mild stage 11/05/2018   Combined forms of age-related cataract of right eye 12/31/2014   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   Presence of orthopedic joint implant 12/03/2013   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 [place], [place], [address] Phone: [phone]   Fax:  [phone]  Name: [name] MRN: [id number] Date of Birth: Dec 02, 1940

## 2022-03-08 ENCOUNTER — Other Ambulatory Visit: Payer: Self-pay | Admitting: Family Medicine

## 2022-03-10 ENCOUNTER — Other Ambulatory Visit: Payer: Self-pay | Admitting: Family Medicine

## 2022-03-10 DIAGNOSIS — I10 Essential (primary) hypertension: Secondary | ICD-10-CM

## 2022-03-14 ENCOUNTER — Ambulatory Visit: Payer: Medicare PPO | Admitting: Physical Therapy

## 2022-03-14 DIAGNOSIS — M6281 Muscle weakness (generalized): Secondary | ICD-10-CM | POA: Diagnosis not present

## 2022-03-14 DIAGNOSIS — R2689 Other abnormalities of gait and mobility: Secondary | ICD-10-CM | POA: Diagnosis not present

## 2022-03-15 ENCOUNTER — Ambulatory Visit: Payer: Medicare PPO

## 2022-03-15 ENCOUNTER — Encounter: Payer: Self-pay | Admitting: Family Medicine

## 2022-03-15 ENCOUNTER — Ambulatory Visit: Payer: Medicare PPO | Admitting: Family Medicine

## 2022-03-15 VITALS — BP 154/83 | HR 58 | Resp 16 | Ht 65.0 in | Wt 155.0 lb

## 2022-03-15 DIAGNOSIS — I10 Essential (primary) hypertension: Secondary | ICD-10-CM

## 2022-03-15 DIAGNOSIS — R2689 Other abnormalities of gait and mobility: Secondary | ICD-10-CM | POA: Diagnosis not present

## 2022-03-15 NOTE — Patient Instructions (Signed)
Please restart the hydrochlorothiazide 12.5 mg.  Monitor for any new onset symptoms including lightheadedness or weakness or fatigue or low blood pressures.

## 2022-03-15 NOTE — Assessment & Plan Note (Signed)
Home BPS look much better than here.  We can retry the HCTZ 12.5 mg but if she feels any symptoms such as dizziness weakness or low blood pressures to please let us know immediately.  We will decrease the amlodipine back down to 2.5 mg she says in fact she just picked up a refill of the 2.5.  She has an appointment in 2 months so I will see her then.

## 2022-03-15 NOTE — Progress Notes (Signed)
Acute Office Visit  Subjective:     Patient ID: [name], female    DOB: 1940/09/25, 81 y.o.   MRN: [id number]  Chief Complaint  Patient presents with   Foot Swelling    Patient complains of swelling in feet for 1 week after starting on Amlodipine.     HPI Patient is in today for swelling in feet and ankles.  About 2 weeks ago she came in her blood pressures were still running high and we increased her amlodipine to 5 mg ever since then she has had increased onset swelling in her ankles and feet.  It gets worse as the day progresses.  She would really like to go back on her old hydrochlorothiazide 12.5 mg.  She says in looking back she is not sure that it was really causing her dizziness at the time.  She did bring in her home blood pressures today.  Most of the systolic pressures are in the 937J in the low 140s.  Diastolics in the 70s.  And pulse mostly in the 50s and low 60s.  No recent chest pain or shortness of breath.  Echocardiogram was completed on June 8 showing mild hypertrophy of the left ventricle, EF of 55 to 60%.  No other concerning findings.  She turned in her Holter monitor last Thursday and so is still awaiting those results.  She is also completed physical therapy for balance and muscle weakness and feels like that actually has been really helpful for her.   ROS      Objective:    BP (!) 154/83   Pulse (!) 58   Resp 16   Ht 5\' 5"  (1.651 m)   Wt 155 lb (70.3 kg)   SpO2 98%   BMI 25.79 kg/m    Physical Exam Vitals and nursing note reviewed.  Constitutional:      Appearance: She is well-developed.  HENT:     Head: Normocephalic and atraumatic.  Cardiovascular:     Rate and Rhythm: Normal rate and regular rhythm.     Heart sounds: Normal heart sounds.  Pulmonary:     Effort: Pulmonary effort is normal.     Breath sounds: Normal breath sounds.  Skin:    General: Skin is warm and dry.  Neurological:     Mental Status: She is alert and oriented  to person, place, and time.  Psychiatric:        Behavior: Behavior normal.     No results found for any visits on 03/15/22.      Assessment & Plan:   Problem List Items Addressed This Visit       Cardiovascular and Mediastinum   Essential hypertension - Primary    Home BPS look much better than here.  We can retry the HCTZ 12.5 mg but if she feels any symptoms such as dizziness weakness or low blood pressures to please let 03/17/22 know immediately.  We will decrease the amlodipine back down to 2.5 mg she says in fact she just picked up a refill of the 2.5.  She has an appointment in 2 months so I will see her then.      Other Visit Diagnoses     Balance disorder           Viewed her echocardiogram results from [facility].  Everything really looks reassuring.  Balance disorder-much improved after physical therapy. \ No orders of the defined types were placed in this encounter.   No follow-ups  on file.  Beatrice Lecher, MD

## 2022-03-16 ENCOUNTER — Ambulatory Visit: Payer: Medicare PPO

## 2022-03-16 ENCOUNTER — Ambulatory Visit: Payer: Medicare PPO | Admitting: Podiatry

## 2022-03-16 ENCOUNTER — Encounter: Payer: Self-pay | Admitting: Podiatry

## 2022-03-16 DIAGNOSIS — M79675 Pain in left toe(s): Secondary | ICD-10-CM

## 2022-03-16 DIAGNOSIS — M79674 Pain in right toe(s): Secondary | ICD-10-CM | POA: Diagnosis not present

## 2022-03-16 DIAGNOSIS — B351 Tinea unguium: Secondary | ICD-10-CM

## 2022-03-16 DIAGNOSIS — E1149 Type 2 diabetes mellitus with other diabetic neurological complication: Secondary | ICD-10-CM | POA: Diagnosis not present

## 2022-03-16 NOTE — Progress Notes (Signed)
Subjective: 81 y.o. returns the office today for painful, elongated, thickened toenails which she cannot trim herself. Denies any redness or drainage around the nails. She is still on gabapentin twice a day which helps some.  Denies any acute changes since last appointment and no new complaints today. Denies any systemic complaints such as fevers, chills, nausea, vomiting.   PCP: Agapito Games, MD Last Seen:03/15/22  A1c: 6.8 on 01/24/22  Objective: AAO 3, NAD DP/PT pulses palpable, CRT less than 3 seconds Nails hypertrophic, dystrophic, elongated, brittle, discolored 10. There is tenderness overlying the nails 1-5 bilaterally. There is no surrounding erythema or drainage along the nail sites. No area pinpoint tenderness.  No edema, erythema.  No other areas of discomfort identified on today's exam. No open lesions or pre-ulcerative lesions are identified. No pain with calf compression, swelling, warmth, erythema.  Assessment: Patient presents with symptomatic onychomycosis; neuropathy   Plan: -Treatment options including alternatives, risks, complications were discussed -Nails sharply debrided 10 without complication/bleeding. -Continue gabapentin.  . -Discussed daily foot inspection. If there are any changes, to call the office immediately.  -Follow-up in 3 months or sooner if any problems are to arise. In the meantime, encouraged to call the office with any questions, concerns, changes symptoms.  Louann Sjogren, DPM

## 2022-03-23 DIAGNOSIS — R55 Syncope and collapse: Secondary | ICD-10-CM | POA: Diagnosis not present

## 2022-03-23 DIAGNOSIS — I10 Essential (primary) hypertension: Secondary | ICD-10-CM | POA: Diagnosis not present

## 2022-03-23 DIAGNOSIS — R001 Bradycardia, unspecified: Secondary | ICD-10-CM | POA: Diagnosis not present

## 2022-04-11 ENCOUNTER — Telehealth: Payer: Self-pay

## 2022-04-11 NOTE — Telephone Encounter (Signed)
[facility] called and left a message requesting a prescription for HCTZ 12.5 mg as discussed at last visit.   Not on medication list.

## 2022-04-12 MED ORDER — HYDROCHLOROTHIAZIDE 12.5 MG PO TABS: 12.5000 mg | ORAL_TABLET | Freq: Every day | ORAL | 1 refills | Status: DC

## 2022-04-12 NOTE — Telephone Encounter (Signed)
Meds ordered this encounter  Medications   hydrochlorothiazide (HYDRODIURIL) 12.5 MG tablet    Sig: Take 1 tablet (12.5 mg total) by mouth daily.    Dispense:  90 tablet    Refill:  1

## 2022-04-12 NOTE — Telephone Encounter (Signed)
I tried multiple times to call patient. Busy signal.

## 2022-05-02 ENCOUNTER — Ambulatory Visit: Payer: Medicare PPO | Admitting: Family Medicine

## 2022-05-02 ENCOUNTER — Encounter: Payer: Self-pay | Admitting: Family Medicine

## 2022-05-02 VITALS — BP 149/83 | HR 53 | Ht 65.0 in | Wt 159.0 lb

## 2022-05-02 DIAGNOSIS — E1149 Type 2 diabetes mellitus with other diabetic neurological complication: Secondary | ICD-10-CM

## 2022-05-02 DIAGNOSIS — E1140 Type 2 diabetes mellitus with diabetic neuropathy, unspecified: Secondary | ICD-10-CM | POA: Diagnosis not present

## 2022-05-02 DIAGNOSIS — I10 Essential (primary) hypertension: Secondary | ICD-10-CM

## 2022-05-02 DIAGNOSIS — I493 Ventricular premature depolarization: Secondary | ICD-10-CM

## 2022-05-02 DIAGNOSIS — I471 Supraventricular tachycardia: Secondary | ICD-10-CM

## 2022-05-02 LAB — POCT GLYCOSYLATED HEMOGLOBIN (HGB A1C): HbA1c POC (<> result, manual entry): 6.3 % (ref 4.0–5.6)

## 2022-05-02 LAB — POCT UA - MICROALBUMIN
Creatinine, POC: 300 mg/dL
Microalbumin Ur, POC: 150 mg/L

## 2022-05-02 NOTE — Assessment & Plan Note (Signed)
High burden of PVCs on heart monitor. Also noted to have SVTs as well. Will refer to urology for consultation to see if they might be able to make some adjustments to her medication.  Her baseline pulse is typically in the 50s so I am a little bit hesitant to adjust her medication.  Blood pressure does typically run in the 140s when she comes in.

## 2022-05-02 NOTE — Progress Notes (Signed)
Established Patient Office Visit  Subjective   Patient ID: [name], female    DOB: 1941-05-08  Age: 81 y.o. MRN: [id number]  No chief complaint on file.   HPI  Diabetes - no hypoglycemic events. No wounds or sores that are not healing well. No increased thirst or urination. Checking glucose at home. Taking medications as prescribed without any side effects. Last a1c 6.8./    Hypertension- Pt denies chest pain, SOB, dizziness, or heart palpitations.  Taking meds as directed w/o problems.  Denies medication side effects.  Blood pressures typically run right around 140 over the 70s.  She would also like to go over to her heart monitor results.  She says she never heard back.  Never really feels like her heart is racing she will just get a intermittent tightness in the chest and occasionally she will feel short of breath with activities.     ROS    Objective:     BP (!) 149/83   Pulse (!) 53   Ht 5\' 5"  (1.651 m)   Wt 159 lb (72.1 kg)   SpO2 99%   BMI 26.46 kg/m    Physical Exam Vitals and nursing note reviewed.  Constitutional:      Appearance: She is well-developed.  HENT:     Head: Normocephalic and atraumatic.  Cardiovascular:     Rate and Rhythm: Normal rate and regular rhythm.     Heart sounds: Normal heart sounds.  Pulmonary:     Effort: Pulmonary effort is normal.     Breath sounds: Normal breath sounds.  Skin:    General: Skin is warm and dry.  Neurological:     Mental Status: She is alert and oriented to person, place, and time.  Psychiatric:        Behavior: Behavior normal.      Results for orders placed or performed in visit on 05/02/22  POCT UA - Microalbumin  Result Value Ref Range   Microalbumin Ur, POC 150 mg/L   Creatinine, POC 300 mg/dL   Albumin/Creatinine Ratio, Urine, POC 30-300   POCT HgB A1C  Result Value Ref Range   Hemoglobin A1C     HbA1c POC (<> result, manual entry) 6.3 4.0 - 5.6 %   HbA1c, POC (prediabetic range)      HbA1c, POC (controlled diabetic range)        The ASCVD Risk score (Arnett DK, et al., 2019) failed to calculate for the following reasons:   The 2019 ASCVD risk score is only valid for ages 37 to 74    Assessment & Plan:   Problem List Items Addressed This Visit       Cardiovascular and Mediastinum   Paroxysmal SVT (supraventricular tachycardia) (HCC)   Relevant Orders   Ambulatory referral to Cardiology   Essential hypertension    BP borderline but OK at home.       Asymptomatic PVCs    High burden of PVCs on heart monitor. Also noted to have SVTs as well. Will refer to urology for consultation to see if they might be able to make some adjustments to her medication.  Her baseline pulse is typically in the 50s so I am a little bit hesitant to adjust her medication.  Blood pressure does typically run in the 140s when she comes in.      Relevant Orders   Ambulatory referral to Cardiology     Endocrine   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  HCC)    Well controlled. Continue current regimen. Follow up in  3 months.  Due for urine micro      Relevant Orders   POCT UA - Microalbumin (Completed)   POCT HgB A1C (Completed)   Type 2 diabetes mellitus with diabetic neuropathy, unspecified (HCC) - Primary   Relevant Orders   POCT UA - Microalbumin (Completed)   POCT HgB A1C (Completed)    Return in about 3 months (around 08/02/2022) for Diabetes follow-up.    Nani Gasser, MD

## 2022-05-02 NOTE — Assessment & Plan Note (Signed)
Well controlled. Continue current regimen. Follow up in  3 months.  Due for urine micro

## 2022-05-02 NOTE — Assessment & Plan Note (Signed)
BP borderline but OK at home.

## 2022-06-02 DIAGNOSIS — I10 Essential (primary) hypertension: Secondary | ICD-10-CM | POA: Diagnosis not present

## 2022-06-02 DIAGNOSIS — I493 Ventricular premature depolarization: Secondary | ICD-10-CM | POA: Diagnosis not present

## 2022-06-02 DIAGNOSIS — R079 Chest pain, unspecified: Secondary | ICD-10-CM | POA: Diagnosis not present

## 2022-06-16 ENCOUNTER — Ambulatory Visit: Payer: Medicare PPO | Admitting: Podiatry

## 2022-06-20 DIAGNOSIS — I493 Ventricular premature depolarization: Secondary | ICD-10-CM | POA: Diagnosis not present

## 2022-06-20 DIAGNOSIS — R079 Chest pain, unspecified: Secondary | ICD-10-CM | POA: Diagnosis not present

## 2022-06-26 ENCOUNTER — Other Ambulatory Visit: Payer: Self-pay

## 2022-06-26 DIAGNOSIS — E1149 Type 2 diabetes mellitus with other diabetic neurological complication: Secondary | ICD-10-CM

## 2022-06-26 DIAGNOSIS — I10 Essential (primary) hypertension: Secondary | ICD-10-CM

## 2022-06-26 MED ORDER — CARVEDILOL 6.25 MG PO TABS: ORAL_TABLET | ORAL | 1 refills | Status: DC

## 2022-06-26 MED ORDER — METFORMIN HCL 1000 MG PO TABS: 1000.0000 mg | ORAL_TABLET | Freq: Two times a day (BID) | ORAL | 1 refills | Status: DC

## 2022-06-26 MED ORDER — GABAPENTIN 300 MG PO CAPS: 300.0000 mg | ORAL_CAPSULE | Freq: Two times a day (BID) | ORAL | 1 refills | Status: DC

## 2022-06-29 DIAGNOSIS — M7061 Trochanteric bursitis, right hip: Secondary | ICD-10-CM | POA: Diagnosis not present

## 2022-06-29 DIAGNOSIS — I10 Essential (primary) hypertension: Secondary | ICD-10-CM | POA: Diagnosis not present

## 2022-07-12 ENCOUNTER — Ambulatory Visit (INDEPENDENT_AMBULATORY_CARE_PROVIDER_SITE_OTHER): Payer: Medicare PPO

## 2022-07-12 ENCOUNTER — Ambulatory Visit
Admission: EM | Admit: 2022-07-12 | Discharge: 2022-07-12 | Disposition: A | Discharge status: Home / Self Care | Payer: Medicare PPO | Attending: Family Medicine | Admitting: Family Medicine

## 2022-07-12 ENCOUNTER — Other Ambulatory Visit: Payer: Self-pay

## 2022-07-12 ENCOUNTER — Encounter: Payer: Self-pay | Admitting: Emergency Medicine

## 2022-07-12 DIAGNOSIS — W19XXXA Unspecified fall, initial encounter: Secondary | ICD-10-CM

## 2022-07-12 DIAGNOSIS — S20211A Contusion of right front wall of thorax, initial encounter: Secondary | ICD-10-CM | POA: Diagnosis not present

## 2022-07-12 DIAGNOSIS — R0781 Pleurodynia: Secondary | ICD-10-CM

## 2022-07-12 DIAGNOSIS — Y92009 Unspecified place in unspecified non-institutional (private) residence as the place of occurrence of the external cause: Secondary | ICD-10-CM | POA: Diagnosis not present

## 2022-07-12 NOTE — Discharge Instructions (Addendum)
Advised/informed patient of the right rib x-rays with hard copy provided to patient.  Advised patient may take OTC Tylenol 1000 to 4000 mg daily, as needed for right rib area pain.  Advised if symptoms worsen and/or unresolved please follow-up with PCP or here for further evaluation.

## 2022-07-12 NOTE — ED Triage Notes (Signed)
Fall yesterday off lawnmower hitting RT flank.

## 2022-07-12 NOTE — ED Provider Notes (Signed)
[name]    CSN: [id number] Arrival date & time: 07/12/22  1041      History   Chief Complaint Chief Complaint  Patient presents with   Fall    HPI [name] is a 81 y.o. female.   HPI Very pleasant 81 year old female presents with fall that occurred yesterday while mowing her yard.  Patient reports falling off her riding lawn more and hitting her right flank.  PMH significant for HTN, asthma, and T2DM.  Past Medical History:  Diagnosis Date   Arthritis    "back; left hip; knees" (12/04/2013)   Asthma    Chronic bronchitis ([place])    "usually get it twice/yr" (12/04/2013)   Diabetes mellitus without complication ([place])    Environmental and seasonal allergies    GERD (gastroesophageal reflux disease)    High cholesterol    Hypertension    pcp   [facility]      Pneumonia    as a child    Patient Active Problem List   Diagnosis Date Noted   Asymptomatic PVCs 05/02/2022   Paroxysmal SVT (supraventricular tachycardia) 05/02/2022   Type 2 diabetes mellitus with diabetic neuropathy, unspecified ([place]) 04/15/2021   DDD (degenerative disc disease), cervical 03/03/2020   BMI 26.0-26.9,adult 05/01/2019   Chronic kidney disease (CKD), stage III (moderate) ([place]) 03/30/2019   Primary open angle glaucoma (POAG) of left eye, mild stage 11/05/2018   Combined forms of age-related cataract of right eye 12/31/2014   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   Presence of orthopedic joint implant 12/03/2013   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[place]) 01/18/2012   DDD (degenerative disc disease), lumbar 01/05/2012   Osteopenia 07/18/2010   Asthma in adult 07/18/2010   Backache 11/01/2009   Anemia, unspecified 09/14/2009   VITAMIN D DEFICIENCY 09/02/2009   Hyperlipidemia 09/02/2009   Essential hypertension 09/02/2009   ALLERGIC RHINITIS 09/02/2009   REFLUX ESOPHAGITIS 09/02/2009   URINARY INCONTINENCE, 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    Past Surgical  History:  Procedure Laterality Date   ABDOMINAL HYSTERECTOMY     BLADDER SUSPENSION     COLONOSCOPY     JOINT REPLACEMENT     SHOULDER ARTHROSCOPY W/ ROTATOR CUFF REPAIR Right    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   Procedure: TOTAL HIP ARTHROPLASTY;  Surgeon: Newt Minion, MD;  Location: [facility];  Service: Orthopedics;  Laterality: Left;  Left Total Hip Arthroplasty   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   Procedure: TOTAL KNEE ARTHROPLASTY;  Surgeon: Newt Minion, MD;  Location: [facility];  Service: Orthopedics;  Laterality: Right;  Right Total Knee Arthroplasty    OB History   No obstetric history on file.      Home Medications    Prior to Admission medications   Medication Sig Start Date End Date Taking? Authorizing Provider  Accu-Chek FastClix Lancets MISC For checking blood sugars up to 3 times daily. E11.49 05/06/19   Hali Marry, MD  ACCU-CHEK GUIDE test strip Test twice daily.  DX: E11.49 10/17/21   Hali Marry, MD  acetaminophen (TYLENOL) 325 MG tablet Take by mouth.    [provider]  amLODipine (NORVASC) 2.5 MG tablet TAKE 1 TABLET(2.5 MG) BY MOUTH DAILY 03/08/22   Hali Marry, MD  aspirin 81 MG chewable tablet Chew 1 tablet by mouth daily. 07/03/18   [provider]  blood glucose meter kit and supplies Dispense based on patient and insurance  preference. For testing up to twice daily. E11.49 05/01/19   Hali Marry, MD  Capsaicin-Menthol 0.025-10 % GEL Apply 1 application topically at bedtime. To feet 12/22/20   Hali Marry, MD  carvedilol (COREG) 6.25 MG tablet TAKE 1 TABLET(6.25 MG) BY MOUTH TWICE DAILY WITH A MEAL 06/26/22   Hali Marry, MD  cetirizine (ZYRTEC) 10 MG tablet Take 10 mg by mouth daily.    [provider]  clobetasol ointment (TEMOVATE) 5.68 % Apply 1 application topically 2 (two) times daily. 04/14/20   [provider]   dorzolamide (TRUSOPT) 2 % ophthalmic solution 1 drop 2 (two) times daily.    [provider]  fluticasone (FLONASE) 50 MCG/ACT nasal spray SHAKE LIQUID AND USE 2 SPRAYS IN EACH NOSTRIL DAILY 05/16/21   Hali Marry, MD  gabapentin (NEURONTIN) 300 MG capsule Take 1 capsule (300 mg total) by mouth 2 (two) times daily. 06/26/22   Hali Marry, MD  hydrochlorothiazide (HYDRODIURIL) 12.5 MG tablet Take 1 tablet (12.5 mg total) by mouth daily. 04/12/22   Hali Marry, MD  ipratropium-albuterol (DUONEB) 0.5-2.5 (3) MG/3ML SOLN Take 3 mLs by nebulization every 6 (six) hours as needed. 12/17/19   Hali Marry, MD  JARDIANCE 10 MG TABS tablet TAKE 1 TABLET(10 MG) BY MOUTH DAILY BEFORE AND BREAKFAST 01/12/22   Hali Marry, MD  metFORMIN (GLUCOPHAGE) 1000 MG tablet Take 1 tablet (1,000 mg total) by mouth 2 (two) times daily with a meal. 06/26/22   Hali Marry, MD  simvastatin (ZOCOR) 80 MG tablet Take 1 tablet (80 mg total) by mouth at bedtime. 07/14/21   Hali Marry, MD    Family History Family History  Problem Relation Age of Onset   Hypertension Mother    Heart disease Mother    Transient ischemic attack Mother    CAD Father    Cancer - Ovarian Sister    Diabetes Daughter     Social History Social History   Tobacco Use   Smoking status: Never   Smokeless tobacco: Never  Vaping Use   Vaping Use: Never used  Substance Use Topics   Alcohol use: No   Drug use: No     Allergies   Losartan, Nsaids, Levofloxacin, Oxycodone hcl, Verapamil, Biaxin [clarithromycin], Oxycontin [oxycodone], and Vicodin [hydrocodone-acetaminophen]   Review of Systems Review of Systems  Musculoskeletal:        Right mid posterior rib pain for 1 day secondary to falling off her riding lawnmower.  All other systems reviewed and are negative.    Physical Exam Triage Vital Signs ED Triage Vitals  Enc Vitals Group     BP 07/12/22 1058 (!)  168/81     Pulse Rate 07/12/22 1058 (!) 54     Resp 07/12/22 1058 16     Temp 07/12/22 1058 99.2 F (37.3 C)     Temp Source 07/12/22 1058 Oral     SpO2 07/12/22 1058 97 %     Weight 07/12/22 1100 168 lb (76.2 kg)     Height 07/12/22 1100 '5\' 5"'  (1.651 m)     Head Circumference --      Peak Flow --      Pain Score 07/12/22 1059 3     Pain Loc --      Pain Edu? --      Excl. in [place]? --    No data found.  Updated Vital Signs BP (!) 168/81 (BP Location: Left Arm)  Pulse (!) 54   Temp 99.2 F (37.3 C) (Oral)   Resp 16   Ht '5\' 5"'  (1.651 m)   Wt 168 lb (76.2 kg)   SpO2 97%   BMI 27.96 kg/m       Physical Exam Vitals and nursing note reviewed.  Constitutional:      Appearance: Normal appearance. She is normal weight.  HENT:     Head: Normocephalic and atraumatic.  Eyes:     Conjunctiva/sclera: Conjunctivae normal.     Pupils: Pupils are equal, round, and reactive to light.  Cardiovascular:     Rate and Rhythm: Normal rate and regular rhythm.     Pulses: Normal pulses.     Heart sounds: Normal heart sounds.  Pulmonary:     Effort: Pulmonary effort is normal.     Breath sounds: Normal breath sounds. No wheezing, rhonchi or rales.  Musculoskeletal:        General: Normal range of motion.     Cervical back: Normal range of motion and neck supple.     Comments: Right posterior ribs (inferior lateral aspect) TTP over 7-9th ribs  Skin:    General: Skin is warm and dry.  Neurological:     General: No focal deficit present.     Mental Status: She is alert and oriented to person, place, and time.      UC Treatments / Results  Labs (all labs ordered are listed, but only abnormal results are displayed) Labs Reviewed - No data to display  EKG   Radiology DG Ribs Unilateral W/Chest Right  Result Date: 07/12/2022 CLINICAL DATA:  Fall.  Right rib pain EXAM: RIGHT RIBS AND CHEST - 3+ VIEW COMPARISON:  Chest 02/24/2021 FINDINGS: Heart size normal. Vascularity normal.  Widening of the right paratracheal soft tissues unchanged likely due to vascular tortuosity. Lungs clear without infiltrate or effusion Negative for right rib fracture. IMPRESSION: No acute cardiopulmonary abnormality. Negative for right rib fracture. Electronically Signed   By: Franchot Gallo M.D.   On: 07/12/2022 11:54    Procedures Procedures (including critical care time)  Medications Ordered in UC Medications - No data to display  Initial Impression / Assessment and Plan / UC Course  I have reviewed the triage vital signs and the nursing notes.  Pertinent labs & imaging results that were available during my care of the patient were reviewed by me and considered in my medical decision making (see chart for details).     MDM: 1.  Right rib contusion, initial encounter-right rib x-ray revealed above. Advised/informed patient of the right rib x-rays with hard copy provided to patient.  Advised patient may take OTC Tylenol 1000 to 4000 mg daily, as needed for right rib area pain.  Advised if symptoms worsen and/or unresolved please follow-up with PCP or here for further evaluation.  Patient discharged home, hemodynamically stable. Final Clinical Impressions(s) / UC Diagnoses   Final diagnoses:  Fall in home, initial encounter  Rib contusion, right, initial encounter     Discharge Instructions      Advised/informed patient of the right rib x-rays with hard copy provided to patient.  Advised patient may take OTC Tylenol 1000 to 4000 mg daily, as needed for right rib area pain.  Advised if symptoms worsen and/or unresolved please follow-up with PCP or here for further evaluation.     ED Prescriptions   None    PDMP not reviewed this encounter.   Eliezer Lofts, [place] 07/12/22 1207

## 2022-07-13 ENCOUNTER — Telehealth: Payer: Self-pay

## 2022-07-13 NOTE — Telephone Encounter (Signed)
TCT pt to follow up from recent visit. Pt denies any questions or concerns at this time.  

## 2022-07-14 ENCOUNTER — Ambulatory Visit: Payer: Medicare PPO | Admitting: Podiatrist

## 2022-07-14 ENCOUNTER — Encounter: Payer: Self-pay | Admitting: Podiatrist

## 2022-07-14 DIAGNOSIS — B351 Tinea unguium: Secondary | ICD-10-CM

## 2022-07-14 DIAGNOSIS — M79674 Pain in right toe(s): Secondary | ICD-10-CM

## 2022-07-14 DIAGNOSIS — M79675 Pain in left toe(s): Secondary | ICD-10-CM

## 2022-07-14 NOTE — Progress Notes (Signed)
Chief Complaint  Patient presents with   Nail Problem     Routine foot care      Subjective: 81 y.o. returns the office today for painful, elongated, thickened toenails which she cannot trim herself. Denies any redness or drainage around the nails. She is still on gabapentin twice a day which helps some but she states she still has pain in her feet that is bothersome.  She is unable to take nsaids due to allergic reaction and has not tried any otc creams.   Denies any acute changes since last appointment and no new complaints today. Denies any systemic complaints such as fevers, chills, nausea, vomiting.    PCP: Hali Marry, MD    A1c: 6.3 on 05/02/22   Objective: AAO 3, NAD DP/PT pulses palpable, CRT less than 3 seconds Nails hypertrophic, dystrophic, elongated, brittle, discolored 10. There is tenderness overlying the nails 1-5 bilaterally. There is no surrounding erythema or drainage along the nail sites. Tenderness with dorsal to plantar pressure on the right hallux nail is noted from the nail thickness present.  No edema, erythema.  No other areas of discomfort identified on today's exam. No open lesions or pre-ulcerative lesions are identified. No pain with calf compression, swelling, warmth, erythema.   Assessment: Patient presents with symptomatic onychomycosis; neuropathy    Plan: -Treatment options including alternatives, risks, complications were discussed -Nails sharply debrided 10 without complication/bleeding. -Continue gabapentin. May wish to talk to her primary care physician about increasing the dose to three times a day . -Discussed daily foot inspection. If there are any changes, to call the office immediately.  -Follow-up in 3 months or sooner if any problems are to arise. In the meantime, encouraged to call the office with any questions, concerns, changes symptoms.

## 2022-07-17 ENCOUNTER — Other Ambulatory Visit: Payer: Self-pay | Admitting: Family Medicine

## 2022-07-17 DIAGNOSIS — E1149 Type 2 diabetes mellitus with other diabetic neurological complication: Secondary | ICD-10-CM

## 2022-08-02 ENCOUNTER — Ambulatory Visit: Payer: Medicare PPO | Admitting: Family Medicine

## 2022-08-02 ENCOUNTER — Encounter: Payer: Self-pay | Admitting: Family Medicine

## 2022-08-02 VITALS — BP 133/64 | HR 55 | Ht 65.0 in | Wt 156.0 lb

## 2022-08-02 DIAGNOSIS — Z23 Encounter for immunization: Secondary | ICD-10-CM

## 2022-08-02 DIAGNOSIS — I10 Essential (primary) hypertension: Secondary | ICD-10-CM

## 2022-08-02 DIAGNOSIS — N1831 Chronic kidney disease, stage 3a: Secondary | ICD-10-CM

## 2022-08-02 DIAGNOSIS — E1140 Type 2 diabetes mellitus with diabetic neuropathy, unspecified: Secondary | ICD-10-CM | POA: Diagnosis not present

## 2022-08-02 LAB — COMPLETE METABOLIC PANEL WITH GFR
AG Ratio: 1.4 (calc) (ref 1.0–2.5)
ALT: 7 U/L (ref 6–29)
AST: 10 U/L (ref 10–35)
Albumin: 4.3 g/dL (ref 3.6–5.1)
Alkaline phosphatase (APISO): 91 U/L (ref 37–153)
BUN: 21 mg/dL (ref 7–25)
CO2: 28 mmol/L (ref 20–32)
Calcium: 9.4 mg/dL (ref 8.6–10.4)
Chloride: 102 mmol/L (ref 98–110)
Creat: 0.82 mg/dL (ref 0.60–0.95)
Globulin: 3 g/dL (calc) (ref 1.9–3.7)
Glucose, Bld: 98 mg/dL (ref 65–99)
Potassium: 4 mmol/L (ref 3.5–5.3)
Sodium: 140 mmol/L (ref 135–146)
Total Bilirubin: 0.5 mg/dL (ref 0.2–1.2)
Total Protein: 7.3 g/dL (ref 6.1–8.1)
eGFR: 71 mL/min/{1.73_m2} (ref >=60)

## 2022-08-02 LAB — CBC
HCT: 39.4 % (ref 35.0–45.0)
Hemoglobin: 13.1 g/dL (ref 11.7–15.5)
MCH: 28.6 pg (ref 27.0–33.0)
MCHC: 33.2 g/dL (ref 32.0–36.0)
MCV: 86 fL (ref 80.0–100.0)
MPV: 10.9 fL (ref 7.5–12.5)
Platelets: 328 10*3/uL (ref 140–400)
RBC: 4.58 10*6/uL (ref 3.80–5.10)
RDW: 14.1 % (ref 11.0–15.0)
WBC: 8.6 10*3/uL (ref 3.8–10.8)

## 2022-08-02 LAB — LIPID PANEL
Cholesterol: 123 mg/dL (ref <200)
HDL: 34 mg/dL — ABNORMAL LOW (ref >=50)
LDL Cholesterol (Calc): 63 mg/dL (calc)
Non-HDL Cholesterol (Calc): 89 mg/dL (calc) (ref <130)
Total CHOL/HDL Ratio: 3.6 (calc) (ref <5.0)
Triglycerides: 190 mg/dL — ABNORMAL HIGH (ref <150)

## 2022-08-02 LAB — POCT GLYCOSYLATED HEMOGLOBIN (HGB A1C): Hemoglobin A1C: 6.5 % — AB (ref 4.0–5.6)

## 2022-08-02 MED ORDER — AMLODIPINE BESYLATE 2.5 MG PO TABS: ORAL_TABLET | ORAL | 3 refills | Status: DC

## 2022-08-02 NOTE — Assessment & Plan Note (Signed)
Due to recheck kidney function 

## 2022-08-02 NOTE — Progress Notes (Signed)
Established Patient Office Visit  Subjective   Patient ID: [name], female    DOB: 09-03-1941  Age: 81 y.o. MRN: [id number]  Chief Complaint  Patient presents with   Diabetes   Hypertension    HPI Diabetes - no hypoglycemic events. No wounds or sores that are not healing well. No increased thirst or urination. Checking glucose at home. Taking medications as prescribed without any side effects.  Hypertension- Pt denies chest pain, SOB, dizziness, or heart palpitations.  Taking meds as directed w/o problems.  Denies medication side effects.  She reports blood home blood pressures typically run in the low 150s over 70s.  Recently saw cardiology and had a negative stress test.     ROS    Objective:     BP 133/64   Pulse (!) 55   Ht 5\' 5"  (1.651 m)   Wt 156 lb (70.8 kg)   SpO2 99%   BMI 25.96 kg/m    Physical Exam Vitals and nursing note reviewed.  Constitutional:      Appearance: She is well-developed.  HENT:     Head: Normocephalic and atraumatic.  Cardiovascular:     Rate and Rhythm: Normal rate and regular rhythm.     Heart sounds: Normal heart sounds.  Pulmonary:     Effort: Pulmonary effort is normal.     Breath sounds: Normal breath sounds.  Skin:    General: Skin is warm and dry.  Neurological:     Mental Status: She is alert and oriented to person, place, and time.  Psychiatric:        Behavior: Behavior normal.      Results for orders placed or performed in visit on 08/02/22  POCT glycosylated hemoglobin (Hb A1C)  Result Value Ref Range   Hemoglobin A1C 6.5 (A) 4.0 - 5.6 %   HbA1c POC (<> result, manual entry)     HbA1c, POC (prediabetic range)     HbA1c, POC (controlled diabetic range)        The ASCVD Risk score (Arnett DK, et al., 2019) failed to calculate for the following reasons:   The 2019 ASCVD risk score is only valid for ages 20 to 41    Assessment & Plan:   Problem List Items Addressed This Visit        Cardiovascular and Mediastinum   Essential hypertension    Reports higher blood pressures at home but it looks really great today some hesitant to make any changes to her regimen.  We will get up-to-date labs make sure renal function is normal.      Relevant Medications   amLODipine (NORVASC) 2.5 MG tablet   Other Relevant Orders   POCT glycosylated hemoglobin (Hb A1C) (Completed)   COMPLETE METABOLIC PANEL WITH GFR   Lipid panel   CBC     Endocrine   Type 2 diabetes mellitus with diabetic neuropathy, unspecified (HCC) - Primary    1C looks great today in fact it is down to 6.5.  She says she really is trying with her diet.  She stays active but she no longer gets out and goes to like she used to.      Relevant Orders   POCT glycosylated hemoglobin (Hb A1C) (Completed)   COMPLETE METABOLIC PANEL WITH GFR   Lipid panel   CBC     Genitourinary   Chronic kidney disease (CKD), stage III (moderate) (HCC)    Due to recheck kidney function.  Relevant Orders   POCT glycosylated hemoglobin (Hb A1C) (Completed)   COMPLETE METABOLIC PANEL WITH GFR   Lipid panel   CBC   Other Visit Diagnoses     Need for immunization against influenza       Relevant Orders   Flu Vaccine QUAD High Dose(Fluad) (Completed)       Urged her to get her Tdap updated at her local pharmacy.  Return in about 6 months (around 01/31/2023) for Hypertension.    Nani Gasser, MD

## 2022-08-02 NOTE — Patient Instructions (Signed)
Encourage you to get your Tdap vaccine done at the pharmacy.  With your Medicare part D it should be free.  And then you are good for 10 years.

## 2022-08-02 NOTE — Assessment & Plan Note (Signed)
Reports higher blood pressures at home but it looks really great today some hesitant to make any changes to her regimen.  We will get up-to-date labs make sure renal function is normal.

## 2022-08-02 NOTE — Assessment & Plan Note (Signed)
1C looks great today in fact it is down to 6.5.  She says she really is trying with her diet.  She stays active but she no longer gets out and goes to like she used to.

## 2022-08-03 NOTE — Progress Notes (Signed)
Call pt: Total cholesterol and LDL look great.  Triglycerides are still high but look better this year.  So great work and bringing those down.  Your metabolic panel looks great.  Blood count looks great.

## 2022-08-09 ENCOUNTER — Ambulatory Visit: Payer: Medicare PPO | Admitting: Family Medicine

## 2022-09-19 ENCOUNTER — Other Ambulatory Visit: Payer: Self-pay | Admitting: Family Medicine

## 2022-09-19 DIAGNOSIS — E785 Hyperlipidemia, unspecified: Secondary | ICD-10-CM

## 2022-09-25 DIAGNOSIS — H401133 Primary open-angle glaucoma, bilateral, severe stage: Secondary | ICD-10-CM | POA: Diagnosis not present

## 2022-09-25 DIAGNOSIS — H524 Presbyopia: Secondary | ICD-10-CM | POA: Diagnosis not present

## 2022-09-25 DIAGNOSIS — H5203 Hypermetropia, bilateral: Secondary | ICD-10-CM | POA: Diagnosis not present

## 2022-09-25 DIAGNOSIS — E119 Type 2 diabetes mellitus without complications: Secondary | ICD-10-CM | POA: Diagnosis not present

## 2022-10-09 DIAGNOSIS — T783XXD Angioneurotic edema, subsequent encounter: Secondary | ICD-10-CM | POA: Diagnosis not present

## 2022-10-09 DIAGNOSIS — J302 Other seasonal allergic rhinitis: Secondary | ICD-10-CM | POA: Diagnosis not present

## 2022-10-09 DIAGNOSIS — J4530 Mild persistent asthma, uncomplicated: Secondary | ICD-10-CM | POA: Diagnosis not present

## 2022-10-13 ENCOUNTER — Ambulatory Visit: Payer: Medicare PPO | Admitting: Podiatry

## 2022-10-20 DIAGNOSIS — R52 Pain, unspecified: Secondary | ICD-10-CM | POA: Diagnosis not present

## 2022-10-20 DIAGNOSIS — M25561 Pain in right knee: Secondary | ICD-10-CM | POA: Diagnosis not present

## 2022-10-20 DIAGNOSIS — Z96651 Presence of right artificial knee joint: Secondary | ICD-10-CM | POA: Diagnosis not present

## 2022-10-20 DIAGNOSIS — G8929 Other chronic pain: Secondary | ICD-10-CM | POA: Diagnosis not present

## 2022-11-02 ENCOUNTER — Ambulatory Visit: Payer: Medicare PPO | Admitting: Podiatry

## 2022-11-02 DIAGNOSIS — B351 Tinea unguium: Secondary | ICD-10-CM

## 2022-11-02 DIAGNOSIS — M79674 Pain in right toe(s): Secondary | ICD-10-CM

## 2022-11-02 DIAGNOSIS — E1149 Type 2 diabetes mellitus with other diabetic neurological complication: Secondary | ICD-10-CM

## 2022-11-02 DIAGNOSIS — M79675 Pain in left toe(s): Secondary | ICD-10-CM

## 2022-11-02 NOTE — Progress Notes (Signed)
Subjective: 82 y.o. returns the office today for painful, elongated, thickened toenails which she cannot trim herself. Denies any redness or drainage around the nails. She is still on gabapentin twice a day which helps some.  Denies any acute changes since last appointment and no new complaints today. Denies any systemic complaints such as fevers, chills, nausea, vomiting.   PCP: Hali Marry, MD Last Seen:03/15/22  A1c: 6.8 on 01/24/22  Objective: AAO 3, NAD DP/PT pulses palpable, CRT less than 3 seconds Nails hypertrophic, dystrophic, elongated, brittle, discolored 10. There is tenderness overlying the nails 1-5 bilaterally. There is no surrounding erythema or drainage along the nail sites. No area pinpoint tenderness.  No edema, erythema.  No other areas of discomfort identified on today's exam. No open lesions or pre-ulcerative lesions are identified. No pain with calf compression, swelling, warmth, erythema.  Assessment: Patient presents with symptomatic onychomycosis; neuropathy   Plan: -Treatment options including alternatives, risks, complications were discussed -Nails sharply debrided 10 without complication/bleeding. -Continue gabapentin.  . -Discussed daily foot inspection. If there are any changes, to call the office immediately.  -Follow-up in 3 months or sooner if any problems are to arise. In the meantime, encouraged to call the office with any questions, concerns, changes symptoms.  Lorenda Peck, DPM

## 2022-11-17 DIAGNOSIS — M7061 Trochanteric bursitis, right hip: Secondary | ICD-10-CM | POA: Diagnosis not present

## 2022-11-20 DIAGNOSIS — M25561 Pain in right knee: Secondary | ICD-10-CM | POA: Diagnosis not present

## 2022-11-20 DIAGNOSIS — Z96651 Presence of right artificial knee joint: Secondary | ICD-10-CM | POA: Diagnosis not present

## 2022-11-20 DIAGNOSIS — R52 Pain, unspecified: Secondary | ICD-10-CM | POA: Diagnosis not present

## 2022-11-20 DIAGNOSIS — G8929 Other chronic pain: Secondary | ICD-10-CM | POA: Diagnosis not present

## 2022-11-20 DIAGNOSIS — M7061 Trochanteric bursitis, right hip: Secondary | ICD-10-CM | POA: Diagnosis not present

## 2022-12-01 DIAGNOSIS — I493 Ventricular premature depolarization: Secondary | ICD-10-CM | POA: Diagnosis not present

## 2022-12-01 DIAGNOSIS — Z1330 Encounter for screening examination for mental health and behavioral disorders, unspecified: Secondary | ICD-10-CM | POA: Diagnosis not present

## 2022-12-05 DIAGNOSIS — G8929 Other chronic pain: Secondary | ICD-10-CM | POA: Diagnosis not present

## 2022-12-05 DIAGNOSIS — R52 Pain, unspecified: Secondary | ICD-10-CM | POA: Diagnosis not present

## 2022-12-05 DIAGNOSIS — Z96651 Presence of right artificial knee joint: Secondary | ICD-10-CM | POA: Diagnosis not present

## 2022-12-05 DIAGNOSIS — M25561 Pain in right knee: Secondary | ICD-10-CM | POA: Diagnosis not present

## 2022-12-05 DIAGNOSIS — M7061 Trochanteric bursitis, right hip: Secondary | ICD-10-CM | POA: Diagnosis not present

## 2022-12-11 DIAGNOSIS — M25561 Pain in right knee: Secondary | ICD-10-CM | POA: Diagnosis not present

## 2022-12-11 DIAGNOSIS — G8929 Other chronic pain: Secondary | ICD-10-CM | POA: Diagnosis not present

## 2022-12-11 DIAGNOSIS — R52 Pain, unspecified: Secondary | ICD-10-CM | POA: Diagnosis not present

## 2022-12-11 DIAGNOSIS — M7061 Trochanteric bursitis, right hip: Secondary | ICD-10-CM | POA: Diagnosis not present

## 2022-12-11 DIAGNOSIS — Z96651 Presence of right artificial knee joint: Secondary | ICD-10-CM | POA: Diagnosis not present

## 2022-12-16 ENCOUNTER — Other Ambulatory Visit: Payer: Self-pay | Admitting: Family Medicine

## 2022-12-16 DIAGNOSIS — I10 Essential (primary) hypertension: Secondary | ICD-10-CM

## 2022-12-18 DIAGNOSIS — M25561 Pain in right knee: Secondary | ICD-10-CM | POA: Diagnosis not present

## 2022-12-18 DIAGNOSIS — G8929 Other chronic pain: Secondary | ICD-10-CM | POA: Diagnosis not present

## 2022-12-18 DIAGNOSIS — M7061 Trochanteric bursitis, right hip: Secondary | ICD-10-CM | POA: Diagnosis not present

## 2022-12-18 DIAGNOSIS — R52 Pain, unspecified: Secondary | ICD-10-CM | POA: Diagnosis not present

## 2022-12-18 DIAGNOSIS — Z96651 Presence of right artificial knee joint: Secondary | ICD-10-CM | POA: Diagnosis not present

## 2022-12-20 ENCOUNTER — Other Ambulatory Visit: Payer: Self-pay | Admitting: *Deleted

## 2022-12-20 DIAGNOSIS — E1149 Type 2 diabetes mellitus with other diabetic neurological complication: Secondary | ICD-10-CM

## 2022-12-20 MED ORDER — METFORMIN HCL 1000 MG PO TABS: 1000.0000 mg | ORAL_TABLET | Freq: Two times a day (BID) | ORAL | 1 refills | Status: DC

## 2023-01-01 DIAGNOSIS — M25561 Pain in right knee: Secondary | ICD-10-CM | POA: Diagnosis not present

## 2023-01-01 DIAGNOSIS — Z96651 Presence of right artificial knee joint: Secondary | ICD-10-CM | POA: Diagnosis not present

## 2023-01-01 DIAGNOSIS — R52 Pain, unspecified: Secondary | ICD-10-CM | POA: Diagnosis not present

## 2023-01-01 DIAGNOSIS — G8929 Other chronic pain: Secondary | ICD-10-CM | POA: Diagnosis not present

## 2023-01-01 DIAGNOSIS — M7061 Trochanteric bursitis, right hip: Secondary | ICD-10-CM | POA: Diagnosis not present

## 2023-01-08 ENCOUNTER — Other Ambulatory Visit: Payer: Self-pay | Admitting: Family Medicine

## 2023-01-08 ENCOUNTER — Ambulatory Visit (INDEPENDENT_AMBULATORY_CARE_PROVIDER_SITE_OTHER): Payer: Medicare PPO | Admitting: Family Medicine

## 2023-01-08 DIAGNOSIS — Z0000 Encounter for general adult medical examination without abnormal findings: Secondary | ICD-10-CM | POA: Diagnosis not present

## 2023-01-08 DIAGNOSIS — Z780 Asymptomatic menopausal state: Secondary | ICD-10-CM

## 2023-01-08 NOTE — Progress Notes (Signed)
MEDICARE ANNUAL WELLNESS VISIT  01/08/2023  Telephone Visit Disclaimer This Medicare AWV was conducted by telephone due to national recommendations for restrictions regarding the COVID-19 Pandemic (e.g. social distancing).  I verified, using two identifiers, that I am speaking with [name] or their authorized healthcare agent. I discussed the limitations, risks, security, and privacy concerns of performing an evaluation and management service by telephone and the potential availability of an in-person appointment in the future. The patient expressed understanding and agreed to proceed.  Location of Patient: Home Location of Provider (nurse):  In the office.  Subjective:    [name] is a 82 y.o. female patient of Julia, Barbarann Ehlers, MD who had a Medicare Annual Wellness Visit today via telephone. [name] is Retired and lives alone. she has 4 children. she reports that she is socially active and does interact with friends/family regularly. she is minimally physically active and enjoys doing puzzles and yardwork.  Patient Care Team: Agapito Games, MD as PCP - General (Family Medicine)     01/08/2023   11:05 AM 02/07/2022   11:37 AM 01/02/2022   10:38 AM 12/08/2020   11:07 AM 03/11/2020    8:54 AM 05/01/2019    3:48 PM 12/04/2013    4:01 PM  Advanced Directives  Does Patient Have a Medical Advance Directive? Yes Yes Yes Yes Yes Yes Patient has advance directive, copy not in chart  Type of Advance Directive Living will  Living will;Healthcare Power of [employer] Power of [place];Living will Healthcare Power of [place];Living will Healthcare Power of Attorney   Does patient want to make changes to medical advance directive? No - Patient declined  No - Patient declined No - Patient declined No - Patient declined No - Patient declined No change requested  Copy of Healthcare Power of Attorney in Chart?   No - copy requested No - copy requested No - copy requested   Copy requested from other (Comment)    Hospital Utilization Over the Past 12 Months: # of hospitalizations or ER visits: 0 # of surgeries: 0  Review of Systems    Patient reports that her overall health is unchanged compared to last year.  History obtained from chart review and the patient  Patient Reported Readings (BP, Pulse, CBG, Weight, etc) none  Pain Assessment Pain : No/denies pain     Current Medications & Allergies (verified) Allergies as of 01/08/2023       Reactions   Losartan Swelling   Angioedema   Nsaids Swelling   IBU - tongue swlling.    Levofloxacin Swelling   Oxycodone Hcl Swelling   Lip started to swell up   Verapamil Swelling   Biaxin [clarithromycin] Other (See Comments)   Elevated heart rate   Oxycontin [oxycodone]    Lips swell   Vicodin [hydrocodone-acetaminophen]    swelling        Medication List        Accurate as of January 08, 2023 11:19 AM. If you have any questions, ask your nurse or doctor.          Accu-Chek FastClix Lancets Misc For checking blood sugars up to 3 times daily. E11.49   Accu-Chek Guide test strip Generic drug: glucose blood Test twice daily.  DX: E11.49   acetaminophen 325 MG tablet Commonly known as: TYLENOL Take by mouth.   amLODipine 2.5 MG tablet Commonly known as: NORVASC TAKE 1 TABLET(2.5 MG) BY MOUTH DAILY   aspirin 81 MG chewable  tablet Chew 1 tablet by mouth daily.   blood glucose meter kit and supplies Dispense based on patient and insurance preference. For testing up to twice daily. E11.49   Capsaicin-Menthol 0.025-10 % Gel Apply 1 application topically at bedtime. To feet   carvedilol 6.25 MG tablet Commonly known as: COREG TAKE 1 TABLET(6.25 MG) BY MOUTH TWICE DAILY WITH A MEAL   cetirizine 10 MG tablet Commonly known as: ZYRTEC Take 10 mg by mouth daily.   clobetasol ointment 0.05 % Commonly known as: TEMOVATE Apply 1 application topically 2 (two) times daily.    dorzolamide 2 % ophthalmic solution Commonly known as: TRUSOPT 1 drop 2 (two) times daily.   fluticasone 50 MCG/ACT nasal spray Commonly known as: FLONASE SHAKE LIQUID AND USE 2 SPRAYS IN EACH NOSTRIL DAILY   gabapentin 300 MG capsule Commonly known as: NEURONTIN TAKE 1 CAPSULE(300 MG) BY MOUTH TWICE DAILY   hydrochlorothiazide 12.5 MG tablet Commonly known as: HYDRODIURIL TAKE 1 TABLET(12.5 MG) BY MOUTH DAILY   ipratropium-albuterol 0.5-2.5 (3) MG/3ML Soln Commonly known as: DUONEB Take 3 mLs by nebulization every 6 (six) hours as needed.   Jardiance 10 MG Tabs tablet Generic drug: empagliflozin TAKE 1 TABLET(10 MG) BY MOUTH DAILY BEFORE BREAKFAST   metFORMIN 1000 MG tablet Commonly known as: GLUCOPHAGE Take 1 tablet (1,000 mg total) by mouth 2 (two) times daily with a meal.   simvastatin 80 MG tablet Commonly known as: ZOCOR TAKE 1 TABLET(80 MG) BY MOUTH AT BEDTIME        History (reviewed): Past Medical History:  Diagnosis Date   Arthritis    "back; left hip; knees" (12/04/2013)   Asthma    Chronic bronchitis    "usually get it twice/yr" (12/04/2013)   Diabetes mellitus without complication    Environmental and seasonal allergies    GERD (gastroesophageal reflux disease)    High cholesterol    Hypertension    pcp   [facility]      Pneumonia    as a child   Past Surgical History:  Procedure Laterality Date   ABDOMINAL HYSTERECTOMY     BLADDER SUSPENSION     COLONOSCOPY     JOINT REPLACEMENT     SHOULDER ARTHROSCOPY W/ ROTATOR CUFF REPAIR Right    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   Procedure: TOTAL HIP ARTHROPLASTY;  Surgeon: Nadara Mustard, MD;  Location: [facility] OR;  Service: Orthopedics;  Laterality: Left;  Left Total Hip Arthroplasty   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   Procedure: TOTAL KNEE ARTHROPLASTY;  Surgeon: Nadara Mustard, MD;  Location: [facility] OR;  Service: Orthopedics;  Laterality: Right;  Right  Total Knee Arthroplasty   Family History  Problem Relation Age of Onset   Hypertension Mother    Heart disease Mother    Transient ischemic attack Mother    CAD Father    Cancer - Ovarian Sister    Diabetes Daughter    Social History   Socioeconomic History   Marital status: Widowed    Spouse name: Not on file   Number of children: 4   Years of education: 12th Grade   Highest education level: High school graduate  Occupational History    Comment: Retired  Tobacco Use   Smoking status: Never   Smokeless tobacco: Never  Vaping Use   Vaping Use: Never used  Substance and Sexual Activity   Alcohol use: No   Drug use: No   Sexual  activity: Yes  Other Topics Concern   Not on file  Social History Narrative   Retired. Widowed. Husband died 01/24/19. Lives alone. She enjoys doing puzzles and yardwork.   Social Determinants of Health   Financial Resource Strain: Low Risk  (01/08/2023)   Overall Financial Resource Strain (CARDIA)    Difficulty of Paying Living Expenses: Not hard at all  Food Insecurity: No Food Insecurity (01/08/2023)   Hunger Vital Sign    Worried About Running Out of Food in the Last Year: Never true    Ran Out of Food in the Last Year: Never true  Transportation Needs: No Transportation Needs (01/08/2023)   PRAPARE - Administrator, Civil Service (Medical): No    Lack of Transportation (Non-Medical): No  Physical Activity: Inactive (01/08/2023)   Exercise Vital Sign    Days of Exercise per Week: 0 days    Minutes of Exercise per Session: 0 min  Stress: No Stress Concern Present (01/08/2023)   [employer] of Occupational Health - Occupational Stress Questionnaire    Feeling of Stress : Not at all  Social Connections: Moderately Integrated (01/08/2023)   Social Connection and Isolation Panel [NHANES]    Frequency of Communication with Friends and Family: More than three times a week    Frequency of Social Gatherings with Friends and  Family: Once a week    Attends Religious Services: More than 4 times per year    Active Member of [employer] or Organizations: Yes    Attends Banker Meetings: More than 4 times per year    Marital Status: Widowed  Recent Concern: Social Connections - Moderately Isolated (01/08/2023)   Social Connection and Isolation Panel [NHANES]    Frequency of Communication with Friends and Family: More than three times a week    Frequency of Social Gatherings with Friends and Family: Once a week    Attends Religious Services: More than 4 times per year    Active Member of [employer] or Organizations: No    Attends Banker Meetings: Never    Marital Status: Widowed    Activities of Daily Living    01/08/2023   11:09 AM  In your present state of health, do you have any difficulty performing the following activities:  Hearing? 0  Vision? 0  Difficulty concentrating or making decisions? 0  Walking or climbing stairs? 1  Comment does not climb stairs  Dressing or bathing? 0  Doing errands, shopping? 0  Preparing Food and eating ? N  Using the Toilet? N  In the past six months, have you accidently leaked urine? Y  Do you have problems with loss of bowel control? N  Managing your Medications? N  Managing your Finances? N  Housekeeping or managing your Housekeeping? N    Patient Education/ Literacy How often do you need to have someone help you when you read instructions, pamphlets, or other written materials from your doctor or pharmacy?: 1 - Never What is the last grade level you completed in school?: 12th grade  Exercise Current Exercise Habits: Home exercise routine, Type of exercise: stretching, Time (Minutes): 20, Frequency (Times/Week): 4, Weekly Exercise (Minutes/Week): 80, Intensity: Mild, Exercise limited by: orthopedic condition(s)  Diet Patient reports consuming 3 meals a day and 1 snack(s) a day Patient reports that her primary diet is: Regular Patient reports  that she does have regular access to food.   Depression Screen    01/08/2023   11:06 AM 05/02/2022  11:27 AM 03/15/2022   10:50 AM 01/02/2022   10:38 AM 07/14/2021    9:12 AM 04/14/2021    8:46 AM 12/08/2020   11:08 AM  PHQ 2/9 Scores  PHQ - 2 Score 0 0 0 0 0 0 0     Fall Risk    01/08/2023   11:06 AM 03/15/2022   10:50 AM 01/05/2022   11:12 AM 01/02/2022   10:38 AM 12/13/2021    2:21 PM  Fall Risk   Falls in the past year? 0 0 1 1 1   Number falls in past yr: 0 0 0 0 0  Injury with Fall? 0 0 1 1 1   Risk for fall due to : No Fall Risks History of fall(s) Impaired balance/gait;Impaired mobility History of fall(s) Other (Comment)  Follow up Falls evaluation completed Falls prevention discussed;Falls evaluation completed Falls prevention discussed;Falls evaluation completed Falls evaluation completed;Education provided Falls prevention discussed     Objective:  [name] seemed alert and oriented and she participated appropriately during our telephone visit.  Blood Pressure Weight BMI  BP Readings from Last 3 Encounters:  08/02/22 133/64  07/12/22 (!) 168/81  05/02/22 (!) 149/83   Wt Readings from Last 3 Encounters:  08/02/22 156 lb (70.8 kg)  07/12/22 168 lb (76.2 kg)  05/02/22 159 lb (72.1 kg)   BMI Readings from Last 1 Encounters:  08/02/22 25.96 kg/m    *Unable to obtain current vital signs, weight, and BMI due to telephone visit type  Hearing/Vision  Harriett Sine did not seem to have difficulty with hearing/understanding during the telephone conversation Reports that she has had a formal eye exam by an eye care professional within the past year Reports that she has not had a formal hearing evaluation within the past year *Unable to fully assess hearing and vision during telephone visit type  Cognitive Function:    01/08/2023   11:11 AM 01/02/2022   10:43 AM 12/08/2020   11:16 AM  6CIT Screen  What Year? 0 points 0 points 0 points  What month? 0 points 0 points 0  points  What time? 0 points 0 points 0 points  Count back from 20 0 points 0 points 0 points  Months in reverse 0 points 0 points 0 points  Repeat phrase 2 points 0 points 0 points  Total Score 2 points 0 points 0 points   (Normal:0-7, Significant for Dysfunction: >8)  Normal Cognitive Function Screening: Yes   Immunization & Health Maintenance Record Immunization History  Administered Date(s) Administered   Fluad Quad(high Dose 65+) 06/19/2019, 06/23/2020, 07/14/2021, 08/02/2022   Influenza, High Dose Seasonal PF 06/12/2016, 06/04/2017, 06/06/2018   Influenza,trivalent, recombinat, inj, PF 07/01/2012, 06/16/2013, 05/28/2014, 05/31/2015   Influenza-Unspecified 05/19/2013   PFIZER(Purple Top)SARS-COV-2 Vaccination 10/13/2019, 11/03/2019   Pneumococcal Conjugate-13 07/27/2014   Pneumococcal Polysaccharide-23 10/23/2011   Pneumococcal-Unspecified 10/23/2011   Tdap 10/23/2011    Health Maintenance  Topic Date Due   DTaP/Tdap/Td (2 - Td or Tdap) 10/22/2021   OPHTHALMOLOGY EXAM  01/08/2023 (Originally 09/19/2022)   FOOT EXAM  01/10/2023 (Originally 10/17/2022)   Zoster Vaccines- Shingrix (1 of 2) 08/03/2023 (Originally 10/10/1990)   COVID-19 Vaccine (3 - 2023-24 season) 08/19/2023 (Originally 05/19/2022)   HEMOGLOBIN A1C  01/31/2023   INFLUENZA VACCINE  04/19/2023   Diabetic kidney evaluation - Urine ACR  05/03/2023   Diabetic kidney evaluation - eGFR measurement  08/03/2023   Medicare Annual Wellness (AWV)  01/08/2024   Pneumonia Vaccine 29+ Years old  Completed  DEXA SCAN  Completed   HPV VACCINES  Aged Out       Assessment  This is a routine wellness examination for [name].  Health Maintenance: Due or Overdue Health Maintenance Due  Topic Date Due   DTaP/Tdap/Td (2 - Td or Tdap) 10/22/2021    [name] does not need a referral for Community Assistance: Care Management:   no Social Work:    no Prescription Assistance:  no Nutrition/Diabetes  Education:  no   Plan:  Personalized Goals  Goals Addressed               This Visit's Progress     Patient Stated (pt-stated)        Patient stated that she would like to work on her balance.       Personalized Health Maintenance & Screening Recommendations  Td vaccine Diabetic eye exam - need records Foot exam Shingles vaccine Dexa scan  Lung Cancer Screening Recommended: no (Low Dose CT Chest recommended if Age 12-80 years, 30 pack-year currently smoking OR have quit w/in past 15 years) Hepatitis C Screening recommended: no HIV Screening recommended: no  Advanced Directives: Written information was not prepared per patient's request.  Referrals & Orders No orders of the defined types were placed in this encounter.   Follow-up Plan Follow-up with Agapito Games, MD as planned Schedule shingles vaccine and tetanus vaccine at the pharmacy.  Foot exam can be done at the next in office. Medicare wellness visit in one year.  AVS printed and mailed to the patient.   I have personally reviewed and noted the following in the patient's chart:   Medical and social history Use of alcohol, tobacco or illicit drugs  Current medications and supplements Functional ability and status Nutritional status Physical activity Advanced directives List of other physicians Hospitalizations, surgeries, and ER visits in previous 12 months Vitals Screenings to include cognitive, depression, and falls Referrals and appointments  In addition, I have reviewed and discussed with [name] certain preventive protocols, quality metrics, and best practice recommendations. A written personalized care plan for preventive services as well as general preventive health recommendations is available and can be mailed to the patient at her request.      Modesto Charon, RN BSN  01/08/2023

## 2023-01-08 NOTE — Patient Instructions (Addendum)
MEDICARE ANNUAL WELLNESS VISIT Health Maintenance Summary and Written Plan of Care  [name] ,  Thank you for allowing me to perform your Medicare Annual Wellness Visit and for your ongoing commitment to your health.   Health Maintenance & Immunization History Health Maintenance  Topic Date Due   DTaP/Tdap/Td (2 - Td or Tdap) 10/22/2021   OPHTHALMOLOGY EXAM  01/08/2023 (Originally 09/19/2022)   FOOT EXAM  01/10/2023 (Originally 10/17/2022)   Zoster Vaccines- Shingrix (1 of 2) 08/03/2023 (Originally 10/10/1990)   COVID-19 Vaccine (3 - 2023-24 season) 08/19/2023 (Originally 05/19/2022)   HEMOGLOBIN A1C  01/31/2023   INFLUENZA VACCINE  04/19/2023   Diabetic kidney evaluation - Urine ACR  05/03/2023   Diabetic kidney evaluation - eGFR measurement  08/03/2023   Medicare Annual Wellness (AWV)  01/08/2024   Pneumonia Vaccine 24+ Years old  Completed   DEXA SCAN  Completed   HPV VACCINES  Aged Out   Immunization History  Administered Date(s) Administered   Fluad Quad(high Dose 65+) 06/19/2019, 06/23/2020, 07/14/2021, 08/02/2022   Influenza, High Dose Seasonal PF 06/12/2016, 06/04/2017, 06/06/2018   Influenza,trivalent, recombinat, inj, PF 07/01/2012, 06/16/2013, 05/28/2014, 05/31/2015   Influenza-Unspecified 05/19/2013   PFIZER(Purple Top)SARS-COV-2 Vaccination 10/13/2019, 11/03/2019   Pneumococcal Conjugate-13 07/27/2014   Pneumococcal Polysaccharide-23 10/23/2011   Pneumococcal-Unspecified 10/23/2011   Tdap 10/23/2011    These are the patient goals that we discussed:  Goals Addressed               This Visit's Progress     Patient Stated (pt-stated)        Patient stated that she would like to work on her balance.         This is a list of Health Maintenance Items that are overdue or due now: Health Maintenance Due  Topic Date Due   DTaP/Tdap/Td (2 - Td or Tdap) 10/22/2021   Td vaccine Diabetic eye exam - need records Foot exam Shingles vaccine Dexa  scan  Orders/Referrals Placed Today: No orders of the defined types were placed in this encounter.  (Contact our referral department at [phone] if you have not spoken with someone about your referral appointment within the next 5 days)    Follow-up Plan Follow-up with Agapito Games, MD as planned Schedule shingles vaccine and tetanus vaccine at the pharmacy.  Foot exam can be done at the next in office. Medicare wellness visit in one year.  AVS printed and mailed to the patient.      Health Maintenance, Female Adopting a healthy lifestyle and getting preventive care are important in promoting health and wellness. Ask your health care provider about: The right schedule for you to have regular tests and exams. Things you can do on your own to prevent diseases and keep yourself healthy. What should I know about diet, weight, and exercise? Eat a healthy diet  Eat a diet that includes plenty of vegetables, fruits, low-fat dairy products, and lean protein. Do not eat a lot of foods that are high in solid fats, added sugars, or sodium. Maintain a healthy weight Body mass index (BMI) is used to identify weight problems. It estimates body fat based on height and weight. Your health care provider can help determine your BMI and help you achieve or maintain a healthy weight. Get regular exercise Get regular exercise. This is one of the most important things you can do for your health. Most adults should: Exercise for at least 150 minutes each week. The exercise should increase your heart  rate and make you sweat (moderate-intensity exercise). Do strengthening exercises at least twice a week. This is in addition to the moderate-intensity exercise. Spend less time sitting. Even light physical activity can be beneficial. Watch cholesterol and blood lipids Have your blood tested for lipids and cholesterol at 84 years of age, then have this test every 5 years. Have your cholesterol  levels checked more often if: Your lipid or cholesterol levels are high. You are older than 84 years of age. You are at high risk for heart disease. What should I know about cancer screening? Depending on your health history and family history, you may need to have cancer screening at various ages. This may include screening for: Breast cancer. Cervical cancer. Colorectal cancer. Skin cancer. Lung cancer. What should I know about heart disease, diabetes, and high blood pressure? Blood pressure and heart disease High blood pressure causes heart disease and increases the risk of stroke. This is more likely to develop in people who have high blood pressure readings or are overweight. Have your blood pressure checked: Every 3-5 years if you are 37-65 years of age. Every year if you are 82 years old or older. Diabetes Have regular diabetes screenings. This checks your fasting blood sugar level. Have the screening done: Once every three years after age 11 if you are at a normal weight and have a low risk for diabetes. More often and at a younger age if you are overweight or have a high risk for diabetes. What should I know about preventing infection? Hepatitis B If you have a higher risk for hepatitis B, you should be screened for this virus. Talk with your health care provider to find out if you are at risk for hepatitis B infection. Hepatitis C Testing is recommended for: Everyone born from [birth year] through 1967. Anyone with known risk factors for hepatitis C. Sexually transmitted infections (STIs) Get screened for STIs, including gonorrhea and chlamydia, if: You are sexually active and are younger than 84 years of age. You are older than 84 years of age and your health care provider tells you that you are at risk for this type of infection. Your sexual activity has changed since you were last screened, and you are at increased risk for chlamydia or gonorrhea. Ask your health care provider if  you are at risk. Ask your health care provider about whether you are at high risk for HIV. Your health care provider may recommend a prescription medicine to help prevent HIV infection. If you choose to take medicine to prevent HIV, you should first get tested for HIV. You should then be tested every 3 months for as long as you are taking the medicine. Pregnancy If you are about to stop having your period (premenopausal) and you may become pregnant, seek counseling before you get pregnant. Take 400 to 800 micrograms (mcg) of folic acid every day if you become pregnant. Ask for birth control (contraception) if you want to prevent pregnancy. Osteoporosis and menopause Osteoporosis is a disease in which the bones lose minerals and strength with aging. This can result in bone fractures. If you are 33 years old or older, or if you are at risk for osteoporosis and fractures, ask your health care provider if you should: Be screened for bone loss. Take a calcium or vitamin D supplement to lower your risk of fractures. Be given hormone replacement therapy (HRT) to treat symptoms of menopause. Follow these instructions at home: Alcohol use Do not drink alcohol if:  Your health care provider tells you not to drink. You are pregnant, may be pregnant, or are planning to become pregnant. If you drink alcohol: Limit how much you have to: 0-1 drink a day. Know how much alcohol is in your drink. In the [place] one drink equals one 12 oz bottle of beer (355 mL), one 5 oz glass of wine (148 mL), or one 1 oz glass of hard liquor (44 mL). Lifestyle Do not use any products that contain nicotine or tobacco. These products include cigarettes, chewing tobacco, and vaping devices, such as e-cigarettes. If you need help quitting, ask your health care provider. Do not use street drugs. Do not share needles. Ask your health care provider for help if you need support or information about quitting drugs. General  instructions Schedule regular health, dental, and eye exams. Stay current with your vaccines. Tell your health care provider if: You often feel depressed. You have ever been abused or do not feel safe at home. Summary Adopting a healthy lifestyle and getting preventive care are important in promoting health and wellness. Follow your health care provider's instructions about healthy diet, exercising, and getting tested or screened for diseases. Follow your health care provider's instructions on monitoring your cholesterol and blood pressure. This information is not intended to replace advice given to you by your health care provider. Make sure you discuss any questions you have with your health care provider. Document Revised: 01/24/2021 Document Reviewed: 01/24/2021 Elsevier Patient Education  2023 [employer].

## 2023-01-15 ENCOUNTER — Other Ambulatory Visit: Payer: Self-pay | Admitting: Family Medicine

## 2023-01-15 DIAGNOSIS — E1149 Type 2 diabetes mellitus with other diabetic neurological complication: Secondary | ICD-10-CM

## 2023-01-31 ENCOUNTER — Ambulatory Visit (INDEPENDENT_AMBULATORY_CARE_PROVIDER_SITE_OTHER): Payer: Medicare PPO

## 2023-01-31 ENCOUNTER — Encounter: Payer: Self-pay | Admitting: Family Medicine

## 2023-01-31 ENCOUNTER — Ambulatory Visit: Payer: Medicare PPO | Admitting: Family Medicine

## 2023-01-31 VITALS — BP 151/64 | HR 53 | Wt 161.0 lb

## 2023-01-31 DIAGNOSIS — E1149 Type 2 diabetes mellitus with other diabetic neurological complication: Secondary | ICD-10-CM | POA: Diagnosis not present

## 2023-01-31 DIAGNOSIS — Z780 Asymptomatic menopausal state: Secondary | ICD-10-CM | POA: Diagnosis not present

## 2023-01-31 DIAGNOSIS — N1831 Chronic kidney disease, stage 3a: Secondary | ICD-10-CM

## 2023-01-31 DIAGNOSIS — E1129 Type 2 diabetes mellitus with other diabetic kidney complication: Secondary | ICD-10-CM | POA: Diagnosis not present

## 2023-01-31 DIAGNOSIS — I10 Essential (primary) hypertension: Secondary | ICD-10-CM | POA: Diagnosis not present

## 2023-01-31 DIAGNOSIS — Z789 Other specified health status: Secondary | ICD-10-CM

## 2023-01-31 DIAGNOSIS — E1140 Type 2 diabetes mellitus with diabetic neuropathy, unspecified: Secondary | ICD-10-CM | POA: Diagnosis not present

## 2023-01-31 DIAGNOSIS — Z7984 Long term (current) use of oral hypoglycemic drugs: Secondary | ICD-10-CM | POA: Diagnosis not present

## 2023-01-31 DIAGNOSIS — R809 Proteinuria, unspecified: Secondary | ICD-10-CM

## 2023-01-31 DIAGNOSIS — M85832 Other specified disorders of bone density and structure, left forearm: Secondary | ICD-10-CM | POA: Diagnosis not present

## 2023-01-31 DIAGNOSIS — M85851 Other specified disorders of bone density and structure, right thigh: Secondary | ICD-10-CM | POA: Diagnosis not present

## 2023-01-31 LAB — POCT GLYCOSYLATED HEMOGLOBIN (HGB A1C): Hemoglobin A1C: 6.4 % — AB (ref 4.0–5.6)

## 2023-01-31 MED ORDER — ACCU-CHEK FASTCLIX LANCETS MISC
4 refills | Status: AC
Start: 2023-01-31 — End: ?

## 2023-01-31 MED ORDER — ACCU-CHEK GUIDE VI STRP: ORAL_STRIP | 6 refills | Status: AC

## 2023-01-31 MED ORDER — HYDROCHLOROTHIAZIDE 12.5 MG PO TABS: ORAL_TABLET | ORAL | 1 refills | Status: DC

## 2023-01-31 NOTE — Progress Notes (Signed)
Established Patient Office Visit  Subjective   Patient ID: [name], female    DOB: Mar 05, 1941  Age: 82 y.o. MRN: [id number]  Chief Complaint  Patient presents with   Hypertension   Diabetes    HPI Diabetes - no hypoglycemic events. No wounds or sores that are not healing well. No increased thirst or urination. Checking glucose at home. Taking medications as prescribed without any side effects.  Hypertension- Pt denies chest pain, SOB, dizziness, or heart palpitations.  Taking meds as directed w/o problems.  Denies medication side effects.    Last couple times we have done a urine microalbumin it has been positive for mild proteinuria.    ROS    Objective:     BP (!) 151/64   Pulse (!) 53   Wt 161 lb (73 kg)   SpO2 100%   BMI 26.79 kg/m    Physical Exam Vitals and nursing note reviewed.  Constitutional:      Appearance: She is well-developed.  HENT:     Head: Normocephalic and atraumatic.  Cardiovascular:     Rate and Rhythm: Normal rate and regular rhythm.     Heart sounds: Normal heart sounds.  Pulmonary:     Effort: Pulmonary effort is normal.     Breath sounds: Normal breath sounds.  Skin:    General: Skin is warm and dry.  Neurological:     Mental Status: She is alert and oriented to person, place, and time.  Psychiatric:        Behavior: Behavior normal.      Results for orders placed or performed in visit on 01/31/23  POCT glycosylated hemoglobin (Hb A1C)  Result Value Ref Range   Hemoglobin A1C 6.4 (A) 4.0 - 5.6 %   HbA1c POC (<> result, manual entry)     HbA1c, POC (prediabetic range)     HbA1c, POC (controlled diabetic range)        The ASCVD Risk score (Arnett DK, et al., 2019) failed to calculate for the following reasons:   The 2019 ASCVD risk score is only valid for ages 48 to 49    Assessment & Plan:   Problem List Items Addressed This Visit       Cardiovascular and Mediastinum   Essential hypertension - Primary     Reports great home BPS.  High here today.  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)   Relevant Medications   hydrochlorothiazide (HYDRODIURIL) 12.5 MG tablet   ACCU-CHEK GUIDE test strip   Accu-Chek FastClix Lancets MISC   Other Relevant Orders   COMPLETE METABOLIC PANEL WITH GFR   POCT glycosylated hemoglobin (Hb A1C) (Completed)   Type 2 diabetes mellitus with diabetic neuropathy, unspecified (HCC)     Genitourinary   CKD stage G3a/A2, 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  Continue good glucose control as well as blood pressure control.      Relevant Medications  hydrochlorothiazide (HYDRODIURIL) 12.5 MG tablet   Other Relevant Orders   COMPLETE METABOLIC PANEL WITH GFR   Other Visit Diagnoses     Microalbuminuria due to type 2 diabetes mellitus (HCC)       ARB intolerance           Return in about 4 months (around 06/03/2023) for Diabetes follow-up.    Nani Gasser, MD

## 2023-01-31 NOTE — Assessment & Plan Note (Signed)
Reports great home BPS.  High here today.  Will have her f/u  NV in 2 weeks to recheck BP

## 2023-01-31 NOTE — Progress Notes (Signed)
Call patient: Bone density shows a T-score -1.8.  This is consistent with mildly thin bones called osteopenia.   The current recommendation for osteopenia (mildly thin bones) treatment includes:   #1 calcium-total of 1200 mg of calcium daily.  If you eat a very calcium rich diet you may be able to obtain that without a supplement.  If not, then I recommend calcium 500 mg twice a day.  There are several products over-the-counter such as Caltrate D and Viactiv chews which are great options that contain calcium and vitamin D. #2 vitamin D-recommend 800 international units daily. #3 exercise-recommend 30 minutes of weightbearing exercise 3 days a week.  Resistance training ,such as doing bands and light weights, can be particularly helpful.

## 2023-01-31 NOTE — Assessment & Plan Note (Addendum)
Would prefer to switch her to ACE or an ARB to help retract her renal function because of mild microalbuminuria.,  But unfortunately she had angioedema with losartan some very hesitant to put her on an ACE or ARB at this point in time.  Continue good glucose control as well as blood pressure control.

## 2023-02-01 ENCOUNTER — Ambulatory Visit: Payer: Medicare PPO | Admitting: Podiatry

## 2023-02-01 ENCOUNTER — Encounter: Payer: Self-pay | Admitting: Podiatry

## 2023-02-01 DIAGNOSIS — M79675 Pain in left toe(s): Secondary | ICD-10-CM

## 2023-02-01 DIAGNOSIS — B351 Tinea unguium: Secondary | ICD-10-CM

## 2023-02-01 DIAGNOSIS — M79674 Pain in right toe(s): Secondary | ICD-10-CM | POA: Diagnosis not present

## 2023-02-01 DIAGNOSIS — E1149 Type 2 diabetes mellitus with other diabetic neurological complication: Secondary | ICD-10-CM

## 2023-02-01 LAB — COMPLETE METABOLIC PANEL WITH GFR
AG Ratio: 1.6 (calc) (ref 1.0–2.5)
ALT: 7 U/L (ref 6–29)
AST: 12 U/L (ref 10–35)
Albumin: 4.7 g/dL (ref 3.6–5.1)
Alkaline phosphatase (APISO): 93 U/L (ref 37–153)
BUN/Creatinine Ratio: 20 (calc) (ref 6–22)
BUN: 20 mg/dL (ref 7–25)
CO2: 27 mmol/L (ref 20–32)
Calcium: 9.9 mg/dL (ref 8.6–10.4)
Chloride: 100 mmol/L (ref 98–110)
Creat: 1 mg/dL — ABNORMAL HIGH (ref 0.60–0.95)
Globulin: 3 g/dL (calc) (ref 1.9–3.7)
Glucose, Bld: 110 mg/dL — ABNORMAL HIGH (ref 65–99)
Potassium: 4.4 mmol/L (ref 3.5–5.3)
Sodium: 139 mmol/L (ref 135–146)
Total Bilirubin: 0.5 mg/dL (ref 0.2–1.2)
Total Protein: 7.7 g/dL (ref 6.1–8.1)
eGFR: 56 mL/min/{1.73_m2} — ABNORMAL LOW (ref >=60)

## 2023-02-01 NOTE — Progress Notes (Signed)
Your lab work is within acceptable range and there are no concerning findings.   ?

## 2023-02-01 NOTE — Progress Notes (Signed)
Subjective: 82 y.o. returns the office today for painful, elongated, thickened toenails which she cannot trim herself. Denies any redness or drainage around the nails. She is still on gabapentin twice a day which helps some.  Denies any acute changes since last appointment and no new complaints today. Denies any systemic complaints such as fevers, chills, nausea, vomiting.   PCP: Metheney, Catherine D, MD Last Seen:03/15/22  A1c: 6.8 on 01/24/22  Objective: AAO 3, NAD DP/PT pulses palpable, CRT less than 3 seconds Nails hypertrophic, dystrophic, elongated, brittle, discolored 10. There is tenderness overlying the nails 1-5 bilaterally. There is no surrounding erythema or drainage along the nail sites. No area pinpoint tenderness.  No edema, erythema.  No other areas of discomfort identified on today's exam. No open lesions or pre-ulcerative lesions are identified. No pain with calf compression, swelling, warmth, erythema.  Assessment: Patient presents with symptomatic onychomycosis; neuropathy   Plan: -Treatment options including alternatives, risks, complications were discussed -Nails sharply debrided 10 without complication/bleeding. -Continue gabapentin.  . -Discussed daily foot inspection. If there are any changes, to call the office immediately.  -Follow-up in 3 months or sooner if any problems are to arise. In the meantime, encouraged to call the office with any questions, concerns, changes symptoms.  Gudelia Eugene, DPM  

## 2023-02-07 DIAGNOSIS — M79661 Pain in right lower leg: Secondary | ICD-10-CM | POA: Diagnosis not present

## 2023-02-07 DIAGNOSIS — R52 Pain, unspecified: Secondary | ICD-10-CM | POA: Diagnosis not present

## 2023-02-07 DIAGNOSIS — M7061 Trochanteric bursitis, right hip: Secondary | ICD-10-CM | POA: Diagnosis not present

## 2023-02-09 DIAGNOSIS — M79661 Pain in right lower leg: Secondary | ICD-10-CM | POA: Diagnosis not present

## 2023-02-22 DIAGNOSIS — M25512 Pain in left shoulder: Secondary | ICD-10-CM | POA: Diagnosis not present

## 2023-02-22 DIAGNOSIS — S46012A Strain of muscle(s) and tendon(s) of the rotator cuff of left shoulder, initial encounter: Secondary | ICD-10-CM | POA: Diagnosis not present

## 2023-02-27 DIAGNOSIS — G8929 Other chronic pain: Secondary | ICD-10-CM | POA: Diagnosis not present

## 2023-02-27 DIAGNOSIS — Z96651 Presence of right artificial knee joint: Secondary | ICD-10-CM | POA: Diagnosis not present

## 2023-02-27 DIAGNOSIS — R52 Pain, unspecified: Secondary | ICD-10-CM | POA: Diagnosis not present

## 2023-02-27 DIAGNOSIS — M25561 Pain in right knee: Secondary | ICD-10-CM | POA: Diagnosis not present

## 2023-02-27 DIAGNOSIS — M7061 Trochanteric bursitis, right hip: Secondary | ICD-10-CM | POA: Diagnosis not present

## 2023-02-27 DIAGNOSIS — S46012A Strain of muscle(s) and tendon(s) of the rotator cuff of left shoulder, initial encounter: Secondary | ICD-10-CM | POA: Diagnosis not present

## 2023-03-19 DIAGNOSIS — R52 Pain, unspecified: Secondary | ICD-10-CM | POA: Diagnosis not present

## 2023-03-19 DIAGNOSIS — M7061 Trochanteric bursitis, right hip: Secondary | ICD-10-CM | POA: Diagnosis not present

## 2023-03-19 DIAGNOSIS — S46012A Strain of muscle(s) and tendon(s) of the rotator cuff of left shoulder, initial encounter: Secondary | ICD-10-CM | POA: Diagnosis not present

## 2023-03-19 DIAGNOSIS — G8929 Other chronic pain: Secondary | ICD-10-CM | POA: Diagnosis not present

## 2023-03-19 DIAGNOSIS — Z96651 Presence of right artificial knee joint: Secondary | ICD-10-CM | POA: Diagnosis not present

## 2023-03-19 DIAGNOSIS — M25561 Pain in right knee: Secondary | ICD-10-CM | POA: Diagnosis not present

## 2023-03-29 DIAGNOSIS — M25561 Pain in right knee: Secondary | ICD-10-CM | POA: Diagnosis not present

## 2023-03-29 DIAGNOSIS — M7061 Trochanteric bursitis, right hip: Secondary | ICD-10-CM | POA: Diagnosis not present

## 2023-03-29 DIAGNOSIS — G8929 Other chronic pain: Secondary | ICD-10-CM | POA: Diagnosis not present

## 2023-03-29 DIAGNOSIS — S46012A Strain of muscle(s) and tendon(s) of the rotator cuff of left shoulder, initial encounter: Secondary | ICD-10-CM | POA: Diagnosis not present

## 2023-03-29 DIAGNOSIS — R52 Pain, unspecified: Secondary | ICD-10-CM | POA: Diagnosis not present

## 2023-03-29 DIAGNOSIS — Z96651 Presence of right artificial knee joint: Secondary | ICD-10-CM | POA: Diagnosis not present

## 2023-04-02 DIAGNOSIS — M25561 Pain in right knee: Secondary | ICD-10-CM | POA: Diagnosis not present

## 2023-04-02 DIAGNOSIS — G8929 Other chronic pain: Secondary | ICD-10-CM | POA: Diagnosis not present

## 2023-04-02 DIAGNOSIS — S46012A Strain of muscle(s) and tendon(s) of the rotator cuff of left shoulder, initial encounter: Secondary | ICD-10-CM | POA: Diagnosis not present

## 2023-04-02 DIAGNOSIS — Z96651 Presence of right artificial knee joint: Secondary | ICD-10-CM | POA: Diagnosis not present

## 2023-04-02 DIAGNOSIS — M7061 Trochanteric bursitis, right hip: Secondary | ICD-10-CM | POA: Diagnosis not present

## 2023-04-02 DIAGNOSIS — R52 Pain, unspecified: Secondary | ICD-10-CM | POA: Diagnosis not present

## 2023-04-05 DIAGNOSIS — M7061 Trochanteric bursitis, right hip: Secondary | ICD-10-CM | POA: Diagnosis not present

## 2023-04-05 DIAGNOSIS — M25561 Pain in right knee: Secondary | ICD-10-CM | POA: Diagnosis not present

## 2023-04-05 DIAGNOSIS — S46012A Strain of muscle(s) and tendon(s) of the rotator cuff of left shoulder, initial encounter: Secondary | ICD-10-CM | POA: Diagnosis not present

## 2023-04-05 DIAGNOSIS — Z96651 Presence of right artificial knee joint: Secondary | ICD-10-CM | POA: Diagnosis not present

## 2023-04-05 DIAGNOSIS — G8929 Other chronic pain: Secondary | ICD-10-CM | POA: Diagnosis not present

## 2023-04-05 DIAGNOSIS — R52 Pain, unspecified: Secondary | ICD-10-CM | POA: Diagnosis not present

## 2023-04-09 DIAGNOSIS — R52 Pain, unspecified: Secondary | ICD-10-CM | POA: Diagnosis not present

## 2023-04-09 DIAGNOSIS — M7061 Trochanteric bursitis, right hip: Secondary | ICD-10-CM | POA: Diagnosis not present

## 2023-04-09 DIAGNOSIS — S46012A Strain of muscle(s) and tendon(s) of the rotator cuff of left shoulder, initial encounter: Secondary | ICD-10-CM | POA: Diagnosis not present

## 2023-04-09 DIAGNOSIS — M25561 Pain in right knee: Secondary | ICD-10-CM | POA: Diagnosis not present

## 2023-04-09 DIAGNOSIS — G8929 Other chronic pain: Secondary | ICD-10-CM | POA: Diagnosis not present

## 2023-04-09 DIAGNOSIS — Z96651 Presence of right artificial knee joint: Secondary | ICD-10-CM | POA: Diagnosis not present

## 2023-04-12 DIAGNOSIS — R52 Pain, unspecified: Secondary | ICD-10-CM | POA: Diagnosis not present

## 2023-04-12 DIAGNOSIS — Z96651 Presence of right artificial knee joint: Secondary | ICD-10-CM | POA: Diagnosis not present

## 2023-04-12 DIAGNOSIS — M7061 Trochanteric bursitis, right hip: Secondary | ICD-10-CM | POA: Diagnosis not present

## 2023-04-12 DIAGNOSIS — M25561 Pain in right knee: Secondary | ICD-10-CM | POA: Diagnosis not present

## 2023-04-12 DIAGNOSIS — G8929 Other chronic pain: Secondary | ICD-10-CM | POA: Diagnosis not present

## 2023-04-12 DIAGNOSIS — S46012A Strain of muscle(s) and tendon(s) of the rotator cuff of left shoulder, initial encounter: Secondary | ICD-10-CM | POA: Diagnosis not present

## 2023-04-16 DIAGNOSIS — G8929 Other chronic pain: Secondary | ICD-10-CM | POA: Diagnosis not present

## 2023-04-16 DIAGNOSIS — M7061 Trochanteric bursitis, right hip: Secondary | ICD-10-CM | POA: Diagnosis not present

## 2023-04-16 DIAGNOSIS — S46012A Strain of muscle(s) and tendon(s) of the rotator cuff of left shoulder, initial encounter: Secondary | ICD-10-CM | POA: Diagnosis not present

## 2023-04-16 DIAGNOSIS — M25561 Pain in right knee: Secondary | ICD-10-CM | POA: Diagnosis not present

## 2023-04-16 DIAGNOSIS — Z96651 Presence of right artificial knee joint: Secondary | ICD-10-CM | POA: Diagnosis not present

## 2023-04-16 DIAGNOSIS — R52 Pain, unspecified: Secondary | ICD-10-CM | POA: Diagnosis not present

## 2023-04-25 DIAGNOSIS — G8929 Other chronic pain: Secondary | ICD-10-CM | POA: Diagnosis not present

## 2023-04-25 DIAGNOSIS — Z96651 Presence of right artificial knee joint: Secondary | ICD-10-CM | POA: Diagnosis not present

## 2023-04-25 DIAGNOSIS — M7061 Trochanteric bursitis, right hip: Secondary | ICD-10-CM | POA: Diagnosis not present

## 2023-04-25 DIAGNOSIS — M25561 Pain in right knee: Secondary | ICD-10-CM | POA: Diagnosis not present

## 2023-04-25 DIAGNOSIS — R52 Pain, unspecified: Secondary | ICD-10-CM | POA: Diagnosis not present

## 2023-04-25 DIAGNOSIS — S46012A Strain of muscle(s) and tendon(s) of the rotator cuff of left shoulder, initial encounter: Secondary | ICD-10-CM | POA: Diagnosis not present

## 2023-05-03 DIAGNOSIS — S46012A Strain of muscle(s) and tendon(s) of the rotator cuff of left shoulder, initial encounter: Secondary | ICD-10-CM | POA: Diagnosis not present

## 2023-05-03 DIAGNOSIS — M7061 Trochanteric bursitis, right hip: Secondary | ICD-10-CM | POA: Diagnosis not present

## 2023-05-03 DIAGNOSIS — Z96651 Presence of right artificial knee joint: Secondary | ICD-10-CM | POA: Diagnosis not present

## 2023-05-03 DIAGNOSIS — M25561 Pain in right knee: Secondary | ICD-10-CM | POA: Diagnosis not present

## 2023-05-03 DIAGNOSIS — R52 Pain, unspecified: Secondary | ICD-10-CM | POA: Diagnosis not present

## 2023-05-03 DIAGNOSIS — G8929 Other chronic pain: Secondary | ICD-10-CM | POA: Diagnosis not present

## 2023-05-04 ENCOUNTER — Ambulatory Visit (INDEPENDENT_AMBULATORY_CARE_PROVIDER_SITE_OTHER): Payer: Medicare PPO | Admitting: Podiatry

## 2023-05-04 DIAGNOSIS — Z91199 Patient's noncompliance with other medical treatment and regimen due to unspecified reason: Secondary | ICD-10-CM

## 2023-05-04 NOTE — Progress Notes (Signed)
No show

## 2023-05-08 DIAGNOSIS — M7061 Trochanteric bursitis, right hip: Secondary | ICD-10-CM | POA: Diagnosis not present

## 2023-05-26 ENCOUNTER — Other Ambulatory Visit: Payer: Self-pay

## 2023-05-26 ENCOUNTER — Encounter: Payer: Self-pay | Admitting: *Deleted

## 2023-05-26 ENCOUNTER — Ambulatory Visit
Admission: EM | Admit: 2023-05-26 | Discharge: 2023-05-26 | Disposition: A | Discharge status: Home / Self Care | Payer: Medicare PPO | Attending: Family Medicine | Admitting: Family Medicine

## 2023-05-26 DIAGNOSIS — R059 Cough, unspecified: Secondary | ICD-10-CM | POA: Diagnosis not present

## 2023-05-26 DIAGNOSIS — J0100 Acute maxillary sinusitis, unspecified: Secondary | ICD-10-CM

## 2023-05-26 DIAGNOSIS — J309 Allergic rhinitis, unspecified: Secondary | ICD-10-CM

## 2023-05-26 MED ORDER — AMOXICILLIN-POT CLAVULANATE 875-125 MG PO TABS: 1.0000 | ORAL_TABLET | Freq: Two times a day (BID) | ORAL | 0 refills | Status: DC

## 2023-05-26 MED ORDER — HYDROCOD POLI-CHLORPHE POLI ER 10-8 MG/5ML PO SUER: 5.0000 mL | Freq: Two times a day (BID) | ORAL | 0 refills | Status: DC | PRN

## 2023-05-26 MED ORDER — FEXOFENADINE HCL 180 MG PO TABS: 180.0000 mg | ORAL_TABLET | Freq: Every day | ORAL | 0 refills | Status: DC

## 2023-05-26 NOTE — Discharge Instructions (Addendum)
Instructed patient to discontinue Flonase and Zyrtec.  Advised patient to take medication as directed with food to completion.  Advised patient to take Allegra with first dose of Augmentin for the next 5 of 10 days.  Advised may use Allegra as needed afterwards for concurrent postnasal drip/drainage.  Advised may use Tussionex for cough.  Patient advised of sedative effects of this medication.  Encouraged to increase daily water intake to 64 ounces per day while taking these medications.  Advised if symptoms worsen and/or unresolved please follow-up with PCP or here for further evaluation.

## 2023-05-26 NOTE — ED Provider Notes (Signed)
[facility]    CSN: [id number] Arrival date & time: 05/26/23  0807      History   Chief Complaint Chief Complaint  Patient presents with   Cough   Nasal Congestion    HPI [name] is a 82 y.o. female.   HPI pleasant 82 year old female presents with postnasal drainage, frontal headache and worsening cough for 2 days.  Denies fever and reports taking OTC Tylenol sinus with regular Tylenol.  PMH significant for HTN, T2DM, and chronic bronchitis.  Past Medical History:  Diagnosis Date   Arthritis    "back; left hip; knees" (12/04/2013)   Asthma    Chronic bronchitis (HCC)    "usually get it twice/yr" (12/04/2013)   Diabetes mellitus without complication (HCC)    Environmental and seasonal allergies    GERD (gastroesophageal reflux disease)    High cholesterol    Hypertension    pcp   [facility]      Pneumonia    as a child    Patient Active Problem List   Diagnosis Date Noted   ARB intolerance 01/31/2023   Asymptomatic PVCs 05/02/2022   Paroxysmal SVT (supraventricular tachycardia) 05/02/2022   Type 2 diabetes mellitus with diabetic neuropathy, unspecified (HCC) 04/15/2021   DDD (degenerative disc disease), cervical 03/03/2020   BMI 26.0-26.9,adult 05/01/2019   CKD stage G3a/A2, GFR 45-59 and albumin creatinine ratio 30-299 mg/g (HCC) 03/30/2019   Primary open angle glaucoma (POAG) of left eye, mild stage 11/05/2018   Combined forms of age-related cataract of right eye 12/31/2014   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   Presence of orthopedic joint implant 12/03/2013   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) 01/18/2012   DDD (degenerative disc disease), lumbar 01/05/2012   Osteopenia 07/18/2010   Asthma in adult 07/18/2010   Backache 11/01/2009   Anemia, unspecified 09/14/2009   VITAMIN D DEFICIENCY 09/02/2009   Hyperlipidemia 09/02/2009   Essential hypertension 09/02/2009   ALLERGIC RHINITIS 09/02/2009   REFLUX  ESOPHAGITIS 09/02/2009   URINARY INCONTINENCE, 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    Past Surgical History:  Procedure Laterality Date   ABDOMINAL HYSTERECTOMY     BLADDER SUSPENSION     COLONOSCOPY     JOINT REPLACEMENT     SHOULDER ARTHROSCOPY W/ ROTATOR CUFF REPAIR Right    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   Procedure: TOTAL HIP ARTHROPLASTY;  Surgeon: Nadara Mustard, MD;  Location: [facility] OR;  Service: Orthopedics;  Laterality: Left;  Left Total Hip Arthroplasty   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   Procedure: TOTAL KNEE ARTHROPLASTY;  Surgeon: Nadara Mustard, MD;  Location: [facility] OR;  Service: Orthopedics;  Laterality: Right;  Right Total Knee Arthroplasty    OB History   No obstetric history on file.      Home Medications    Prior to Admission medications   Medication Sig Start Date End Date Taking? Authorizing Provider  amLODipine (NORVASC) 2.5 MG tablet TAKE 1 TABLET(2.5 MG) BY MOUTH DAILY 08/02/22  Yes Agapito Games, MD  amoxicillin-clavulanate (AUGMENTIN) 875-125 MG tablet Take 1 tablet by mouth 2 (two) times daily for 10 days. 05/26/23 06/05/23 Yes Trevor Iha, FNP  aspirin 81 MG chewable tablet Chew 1 tablet by mouth daily. 07/03/18  Yes [provider]  carvedilol (COREG) 6.25 MG tablet TAKE 1 TABLET(6.25 MG) BY MOUTH TWICE DAILY WITH A MEAL 12/19/22  Yes Agapito Games, MD  chlorpheniramine-HYDROcodone (TUSSIONEX) 10-8 MG/5ML Take 5  mLs by mouth every 12 (twelve) hours as needed for cough. 05/26/23  Yes Trevor Iha, FNP  dorzolamide (TRUSOPT) 2 % ophthalmic solution 1 drop 2 (two) times daily.   Yes [provider]  fexofenadine (ALLEGRA ALLERGY) 180 MG tablet Take 1 tablet (180 mg total) by mouth daily for 15 days. 05/26/23 06/10/23 Yes Trevor Iha, FNP  gabapentin (NEURONTIN) 300 MG capsule TAKE 1 CAPSULE(300 MG) BY MOUTH TWICE DAILY 12/19/22  Yes Agapito Games, MD  hydrochlorothiazide (HYDRODIURIL) 12.5  MG tablet TAKE 1 TABLET(12.5 MG) BY MOUTH DAILY 01/31/23  Yes Agapito Games, MD  JARDIANCE 10 MG TABS tablet TAKE 1 TABLET(10 MG) BY MOUTH DAILY BEFORE BREAKFAST 01/15/23  Yes Agapito Games, MD  metFORMIN (GLUCOPHAGE) 1000 MG tablet Take 1 tablet (1,000 mg total) by mouth 2 (two) times daily with a meal. 12/20/22  Yes Agapito Games, MD  simvastatin (ZOCOR) 80 MG tablet TAKE 1 TABLET(80 MG) BY MOUTH AT BEDTIME 09/19/22  Yes Agapito Games, MD  Accu-Chek FastClix Lancets MISC For checking blood sugars up to 3 times daily. E11.49 01/31/23   Agapito Games, MD  ACCU-CHEK GUIDE test strip Test twice daily.  DX: E11.49 01/31/23   Agapito Games, MD  blood glucose meter kit and supplies Dispense based on patient and insurance preference. For testing up to twice daily. E11.49 05/01/19   Agapito Games, MD  ipratropium-albuterol (DUONEB) 0.5-2.5 (3) MG/3ML SOLN Take 3 mLs by nebulization every 6 (six) hours as needed. 12/17/19   Agapito Games, MD    Family History Family History  Problem Relation Age of Onset   Hypertension Mother    Heart disease Mother    Transient ischemic attack Mother    CAD Father    Cancer - Ovarian Sister    Diabetes Daughter     Social History Social History   Tobacco Use   Smoking status: Never   Smokeless tobacco: Never  Vaping Use   Vaping status: Never Used  Substance Use Topics   Alcohol use: No   Drug use: No     Allergies   Losartan, Nsaids, Levofloxacin, Oxycodone hcl, Verapamil, Biaxin [clarithromycin], Oxycontin [oxycodone], and Vicodin [hydrocodone-acetaminophen]   Review of Systems Review of Systems  HENT:  Positive for postnasal drip and voice change.   Respiratory:  Positive for cough.   Neurological:  Positive for headaches.  All other systems reviewed and are negative.    Physical Exam Triage Vital Signs ED Triage Vitals  Encounter Vitals Group     BP 05/26/23 0822 (!) 163/77      Systolic BP Percentile --      Diastolic BP Percentile --      Pulse Rate 05/26/23 0822 (!) 55     Resp 05/26/23 0822 20     Temp 05/26/23 0824 97.8 F (36.6 C)     Temp Source 05/26/23 0824 Oral     SpO2 05/26/23 0822 98 %     Weight --      Height --      Head Circumference --      Peak Flow --      Pain Score 05/26/23 0825 0     Pain Loc --      Pain Education --      Exclude from Growth Chart --    No data found.  Updated Vital Signs BP (!) 163/77 Comment: states just took HTN med  Pulse (!) 55   Temp 97.8  F (36.6 C) (Oral)   Resp 20   SpO2 98%      Physical Exam Vitals and nursing note reviewed.  Constitutional:      General: She is not in acute distress.    Appearance: Normal appearance. She is normal weight. She is ill-appearing.  HENT:     Head: Normocephalic and atraumatic.     Ears:     Comments: TM's are dull, cloudy; significant eustachian tube dysfunction noted bilaterally    Mouth/Throat:     Mouth: Mucous membranes are moist.     Pharynx: Oropharynx is clear.     Comments: Significant amount of clear drainage of posterior oropharynx noted Eyes:     Extraocular Movements: Extraocular movements intact.     Conjunctiva/sclera: Conjunctivae normal.     Pupils: Pupils are equal, round, and reactive to light.  Cardiovascular:     Rate and Rhythm: Normal rate and regular rhythm.     Pulses: Normal pulses.     Heart sounds: Normal heart sounds.  Pulmonary:     Effort: Pulmonary effort is normal.     Breath sounds: Normal breath sounds. No wheezing, rhonchi or rales.     Comments: Infrequent nonproductive cough noted on exam Musculoskeletal:        General: Normal range of motion.     Cervical back: Normal range of motion and neck supple.  Skin:    General: Skin is warm and dry.  Neurological:     General: No focal deficit present.     Mental Status: She is alert and oriented to person, place, and time. Mental status is at baseline.  Psychiatric:         Mood and Affect: Mood normal.        Behavior: Behavior normal.      UC Treatments / Results  Labs (all labs ordered are listed, but only abnormal results are displayed) Labs Reviewed - No data to display  EKG   Radiology No results found.  Procedures Procedures (including critical care time)  Medications Ordered in UC Medications - No data to display  Initial Impression / Assessment and Plan / UC Course  I have reviewed the triage vital signs and the nursing notes.  Pertinent labs & imaging results that were available during my care of the patient were reviewed by me and considered in my medical decision making (see chart for details).     MDM: 1.  Acute maxillary sinusitis, recurrence not specified-Rx'd Augmentin 875/125 mg tablet twice daily x 10 days; 2.  Cough, unspecified type-Rx'd Tussionex 10-8 MG/5 mL: Take 5 mL every 12 hours as needed for cough.  Patient reports taking this medication previously without adverse reactions or side effects.  Patient requests this medication today at office visit. 3.  Allergic rhinitis, unspecified seasonality, unspecified trigger-Rx'd Allegra 180 mg daily x 5 days, then as needed for concurrent postnasal drip/drainage. Instructed patient to discontinue Flonase and Zyrtec.  Advised patient to take medication as directed with food to completion.  Advised patient to take Allegra with first dose of Augmentin for the next 5 of 10 days.  Advised may use Allegra as needed afterwards for concurrent postnasal drip/drainage.  Advised may use Tussionex for cough.  Patient advised of sedative effects of this medication.  Encouraged to increase daily water intake to 64 ounces per day while taking these medications.  Advised if symptoms worsen and/or unresolved please follow-up with PCP or here for further evaluation.  Patient discharged home, hemodynamically  stable. Final Clinical Impressions(s) / UC Diagnoses   Final diagnoses:  Cough,  unspecified type  Acute maxillary sinusitis, recurrence not specified  Allergic rhinitis, unspecified seasonality, unspecified trigger     Discharge Instructions      Instructed patient to discontinue Flonase and Zyrtec.  Advised patient to take medication as directed with food to completion.  Advised patient to take Allegra with first dose of Augmentin for the next 5 of 10 days.  Advised may use Allegra as needed afterwards for concurrent postnasal drip/drainage.  Advised may use Tussionex for cough.  Patient advised of sedative effects of this medication.  Encouraged to increase daily water intake to 64 ounces per day while taking these medications.  Advised if symptoms worsen and/or unresolved please follow-up with PCP or here for further evaluation.     ED Prescriptions     Medication Sig Dispense Auth. Provider   amoxicillin-clavulanate (AUGMENTIN) 875-125 MG tablet Take 1 tablet by mouth 2 (two) times daily for 10 days. 20 tablet Trevor Iha, FNP   fexofenadine [facility] ALLERGY) 180 MG tablet Take 1 tablet (180 mg total) by mouth daily for 15 days. 15 tablet Trevor Iha, FNP   chlorpheniramine-HYDROcodone (TUSSIONEX) 10-8 MG/5ML Take 5 mLs by mouth every 12 (twelve) hours as needed for cough. 115 mL Trevor Iha, FNP      I have reviewed the PDMP during this encounter.   Trevor Iha, FNP 05/26/23 [phone]

## 2023-05-26 NOTE — ED Triage Notes (Signed)
C/O postnasal drainage, frontal HA, coughing (worse at night) onset approx 2 days ago. C/O hoarse voice today; describes cough as productive "with clear liquid". Denies any fevers. Has taken Tyl Sinus and regular Tyl.

## 2023-05-26 NOTE — ED Notes (Signed)
Pt sitting in low chair in exam room; ambulates with cane

## 2023-05-27 ENCOUNTER — Telehealth: Payer: Self-pay | Admitting: Family Medicine

## 2023-05-31 ENCOUNTER — Ambulatory Visit: Payer: Medicare PPO | Admitting: Family Medicine

## 2023-05-31 ENCOUNTER — Encounter: Payer: Self-pay | Admitting: Family Medicine

## 2023-05-31 ENCOUNTER — Ambulatory Visit (INDEPENDENT_AMBULATORY_CARE_PROVIDER_SITE_OTHER): Payer: Medicare PPO

## 2023-05-31 DIAGNOSIS — R051 Acute cough: Secondary | ICD-10-CM

## 2023-05-31 DIAGNOSIS — R918 Other nonspecific abnormal finding of lung field: Secondary | ICD-10-CM | POA: Diagnosis not present

## 2023-05-31 DIAGNOSIS — R630 Anorexia: Secondary | ICD-10-CM

## 2023-05-31 DIAGNOSIS — R531 Weakness: Secondary | ICD-10-CM

## 2023-05-31 DIAGNOSIS — R0989 Other specified symptoms and signs involving the circulatory and respiratory systems: Secondary | ICD-10-CM | POA: Diagnosis not present

## 2023-05-31 DIAGNOSIS — R059 Cough, unspecified: Secondary | ICD-10-CM | POA: Diagnosis not present

## 2023-05-31 LAB — CMP14+EGFR
ALT: 7 IU/L (ref 0–32)
AST: 11 IU/L (ref 0–40)
Albumin: 4.1 g/dL (ref 3.7–4.7)
Alkaline Phosphatase: 99 IU/L (ref 44–121)
BUN/Creatinine Ratio: 21 (ref 12–28)
BUN: 19 mg/dL (ref 8–27)
Bilirubin Total: 0.6 mg/dL (ref 0.0–1.2)
CO2: 22 mmol/L (ref 20–29)
Calcium: 8.8 mg/dL (ref 8.7–10.3)
Chloride: 93 mmol/L — ABNORMAL LOW (ref 96–106)
Creatinine, Ser: 0.92 mg/dL (ref 0.57–1.00)
Globulin, Total: 2.8 g/dL (ref 1.5–4.5)
Glucose: 133 mg/dL — ABNORMAL HIGH (ref 70–99)
Potassium: 3.9 mmol/L (ref 3.5–5.2)
Sodium: 133 mmol/L — ABNORMAL LOW (ref 134–144)
Total Protein: 6.9 g/dL (ref 6.0–8.5)
eGFR: 61 mL/min/{1.73_m2} (ref >59)

## 2023-05-31 LAB — POC COVID19 BINAXNOW: SARS Coronavirus 2 Ag: NEGATIVE

## 2023-05-31 LAB — CBC WITH DIFFERENTIAL/PLATELET
Basophils Absolute: 0 10*3/uL (ref 0.0–0.2)
Basos: 0 %
EOS (ABSOLUTE): 0 10*3/uL (ref 0.0–0.4)
Eos: 0 %
Hematocrit: 38.6 % (ref 34.0–46.6)
Hemoglobin: 13.1 g/dL (ref 11.1–15.9)
Immature Granulocytes: 0 %
Lymphocytes Absolute: 1 10*3/uL (ref 0.7–3.1)
Lymphs: 8 %
MCH: 29.2 pg (ref 26.6–33.0)
MCHC: 33.9 g/dL (ref 31.5–35.7)
MCV: 86 fL (ref 79–97)
Monocytes Absolute: 0.7 10*3/uL (ref 0.1–0.9)
Monocytes: 6 %
Neutrophils Absolute: 10.4 10*3/uL — ABNORMAL HIGH (ref 1.4–7.0)
Neutrophils: 86 %
Platelets: 292 10*3/uL (ref 150–450)
RBC: 4.49 x10E6/uL (ref 3.77–5.28)
RDW: 13.6 % (ref 11.7–15.4)
WBC: 12.1 10*3/uL — ABNORMAL HIGH (ref 3.4–10.8)

## 2023-05-31 LAB — POCT INFLUENZA A/B
Influenza A, POC: NEGATIVE
Influenza B, POC: NEGATIVE

## 2023-05-31 MED ORDER — AZITHROMYCIN 250 MG PO TABS: ORAL_TABLET | ORAL | 0 refills | Status: DC

## 2023-05-31 NOTE — Progress Notes (Signed)
Call patient: White blood cell count is mildly elevated most consistent with early pneumonia.  Sodium and chloride just a little borderline low.  Okay to liberalize salt.

## 2023-05-31 NOTE — Progress Notes (Signed)
Acute Office Visit  Subjective:     Patient ID: [name], female    DOB: [birth year], 82 y.o.   MRN: [id number]  Chief Complaint  Patient presents with   Dizziness    HPI Patient is in today for not feeling well.  She says her symptoms really started on Friday with a little bit of a cough and some congestion no fevers or chills.  No back she says she has not run a fever at all since this started.  The next day she went to urgent care and was evaluated.  They recommended changing her Zyrtec to Allegra and then also put her on Augmentin which she did start.  Now she has a more productive cough and a runny nose.  No appetite.  Today she actually feels weak.  Again no fever or chills.  She does feel like the drainage in her sinuses have been a little bit better since starting antibiotic.  But she still continues to have the other symptoms including the cough and runny nose.  She says her other concern is that her head just feels "funny" almost like a dizziness.  But also just a fogginess.  She denies any diarrhea.  ROS      Objective:    There were no vitals taken for this visit.   Physical Exam Vitals and nursing note reviewed.  Constitutional:      Appearance: Normal appearance.  HENT:     Head: Normocephalic and atraumatic.     Right Ear: Tympanic membrane, ear canal and external ear normal. There is no impacted cerumen.     Left Ear: Tympanic membrane, ear canal and external ear normal. There is no impacted cerumen.     Nose: Nose normal.     Mouth/Throat:     Pharynx: Oropharynx is clear.  Eyes:     Conjunctiva/sclera: Conjunctivae normal.  Cardiovascular:     Rate and Rhythm: Normal rate and regular rhythm.  Pulmonary:     Effort: Pulmonary effort is normal.     Breath sounds: Rhonchi present. No wheezing.  Musculoskeletal:     Cervical back: Neck supple. No tenderness.  Lymphadenopathy:     Cervical: No cervical adenopathy.  Skin:    General: Skin is warm and  dry.  Neurological:     Mental Status: She is alert and oriented to person, place, and time.  Psychiatric:        Mood and Affect: Mood normal.     Results for orders placed or performed in visit on 05/31/23  POC COVID-19  Result Value Ref Range   SARS Coronavirus 2 Ag Negative Negative  POCT Influenza A/B  Result Value Ref Range   Influenza A, POC Negative Negative   Influenza B, POC Negative Negative        Assessment & Plan:   Problem List Items Addressed This Visit   None Visit Diagnoses     Acute cough    -  Primary   Relevant Orders   CBC with Differential/Platelet   CMP14+EGFR   DG Chest 2 View (Completed)   POC COVID-19 (Completed)   POCT Influenza A/B (Completed)   Decreased appetite       Relevant Orders   CBC with Differential/Platelet   CMP14+EGFR   DG Chest 2 View (Completed)   General weakness          Acute cough and congestion-we did go ahead and test her for COVID and flu while she was  here today and she was negative.  She had rhonchi throughout her lungs initially it did improve somewhat after cough.  But will get chest x-ray today would also like to check labs just to make sure her sodium levels are not down for some reason.  She wonders if it could be the antibiotic hat is making her feel bad  Weakness - will check CBC with diff and electrolytes. Ordered labs stat.    No orders of the defined types were placed in this encounter.   No follow-ups on file.  Nani Gasser, MD

## 2023-05-31 NOTE — Progress Notes (Signed)
Please call patient and her daughter-in-law it looks like she may have an early right middle lobe pneumonia.  I would like for her to continue the amoxicillin clavulanic acid but I am also going to send over a Z-Pak so I want her to take that 1 as well.  I really want her to give [place] a call tomorrow and let us know how she is doing.  Orders Placed This Encounter     azithromycin (ZITHROMAX) 250 MG tablet         Sig: 2 Ttabs PO on Day 1, then one a day x 4 days.         Dispense:  6 tablet         Refill:  0

## 2023-05-31 NOTE — Telephone Encounter (Signed)
Patient requesting new cough syrup.  Medication has been sent to pharmacy per patient request.

## 2023-06-01 ENCOUNTER — Telehealth: Payer: Self-pay | Admitting: Family Medicine

## 2023-06-01 NOTE — Telephone Encounter (Signed)
Is call patient and just check on her and see if she was able to get the second antibiotic last night.

## 2023-06-01 NOTE — Telephone Encounter (Signed)
Spoke with patient. States she was able to get the second abx last night and early enough that she took the first pill last night.  She is feeling about the same as before. Still cough, no appetite and nothing taste right. She states her breathing is o.k. and she does not have fever.

## 2023-06-04 ENCOUNTER — Ambulatory Visit: Payer: Medicare PPO | Admitting: Family Medicine

## 2023-06-04 ENCOUNTER — Encounter: Payer: Self-pay | Admitting: Family Medicine

## 2023-06-04 VITALS — BP 116/63 | HR 59 | Ht 65.0 in | Wt 150.0 lb

## 2023-06-04 DIAGNOSIS — N1831 Chronic kidney disease, stage 3a: Secondary | ICD-10-CM | POA: Diagnosis not present

## 2023-06-04 DIAGNOSIS — J4520 Mild intermittent asthma, uncomplicated: Secondary | ICD-10-CM | POA: Diagnosis not present

## 2023-06-04 DIAGNOSIS — E1149 Type 2 diabetes mellitus with other diabetic neurological complication: Secondary | ICD-10-CM

## 2023-06-04 DIAGNOSIS — Z7984 Long term (current) use of oral hypoglycemic drugs: Secondary | ICD-10-CM

## 2023-06-04 DIAGNOSIS — J189 Pneumonia, unspecified organism: Secondary | ICD-10-CM

## 2023-06-04 DIAGNOSIS — R42 Dizziness and giddiness: Secondary | ICD-10-CM

## 2023-06-04 DIAGNOSIS — I10 Essential (primary) hypertension: Secondary | ICD-10-CM | POA: Diagnosis not present

## 2023-06-04 LAB — POCT UA - MICROALBUMIN
Creatinine, POC: 100 mg/dL
Microalbumin Ur, POC: 150 mg/L

## 2023-06-04 LAB — POCT GLYCOSYLATED HEMOGLOBIN (HGB A1C): Hemoglobin A1C: 6.3 % — AB (ref 4.0–5.6)

## 2023-06-04 MED ORDER — IPRATROPIUM-ALBUTEROL 0.5-2.5 (3) MG/3ML IN SOLN
3.0000 mL | Freq: Four times a day (QID) | RESPIRATORY_TRACT | 1 refills | Status: AC | PRN
Start: 2023-06-04 — End: ?

## 2023-06-04 MED ORDER — METFORMIN HCL 1000 MG PO TABS
1000.0000 mg | ORAL_TABLET | Freq: Two times a day (BID) | ORAL | 3 refills | Status: DC
Start: 2023-06-04 — End: 2024-06-19

## 2023-06-04 MED ORDER — EMPAGLIFLOZIN 10 MG PO TABS: ORAL_TABLET | ORAL | 3 refills | Status: DC

## 2023-06-04 NOTE — Assessment & Plan Note (Signed)
Doing well on metformin and Jardiance.  A1c is 6.3 today which is fantastic.  She does feeling her appetite is starting to come back a little bit that she still really cannot taste her food very well.  She has been trying to drink plenty fluids and hydrate.

## 2023-06-04 NOTE — Progress Notes (Signed)
Established Patient Office Visit  Subjective   Patient ID: Julia DURRENCE, female    DOB: Sep 14, 1941  Age: 82 y.o. MRN: [id number]  Chief Complaint  Patient presents with   Diabetes    HPI Diabetes - no hypoglycemic events. No wounds or sores that are not healing well. No increased thirst or urination. Checking glucose at home. Taking medications as prescribed without any side effects.  Also here for follow-up to recheck her chest.  She came in with upper respiratory symptoms that were getting progressively worse.  We ended up doing a chest x-ray on 9/12, which showed opacity in the right middle lobe concerning for pneumonia.  Will finish up her antibiotics tonight.  She has been feeling a little lightheaded and dizzy on and off.  She does feel like she is about 50% better in regards to her respiratory symptoms.  She has been having some hot flashes that last for about 10 to 15 minutes from her head up.  She still does not have much of an appetite.  She is trying really hard to stay hydrated.  She feels like she is producing less mucus and she is not as short winded if she was.  She says she does have a nebulizer at home and would like to have the solution to go in it.    ROS    Objective:     BP 116/63   Pulse (!) 59   Ht 5\' 5"  (1.651 m)   Wt 150 lb (68 kg)   SpO2 100%   BMI 24.96 kg/m    Physical Exam Vitals and nursing note reviewed.  Constitutional:      Appearance: Normal appearance.  HENT:     Head: Normocephalic and atraumatic.  Eyes:     Conjunctiva/sclera: Conjunctivae normal.  Cardiovascular:     Rate and Rhythm: Normal rate and regular rhythm.  Pulmonary:     Effort: Pulmonary effort is normal.     Breath sounds: Normal breath sounds.  Skin:    General: Skin is warm and dry.  Neurological:     Mental Status: She is alert.  Psychiatric:        Mood and Affect: Mood normal.      Results for orders placed or performed in visit on 06/04/23  POCT HgB  A1C  Result Value Ref Range   Hemoglobin A1C 6.3 (A) 4.0 - 5.6 %   HbA1c POC (<> result, manual entry)     HbA1c, POC (prediabetic range)     HbA1c, POC (controlled diabetic range)    POCT UA - Microalbumin  Result Value Ref Range   Microalbumin Ur, POC 150 mg/L   Creatinine, POC 100 mg/dL   Albumin/Creatinine Ratio, Urine, POC 30-300       The ASCVD Risk score (Arnett DK, et al., 2019) failed to calculate for the following reasons:   The 2019 ASCVD risk score is only valid for ages 4 to 4    Assessment & Plan:   Problem List Items Addressed This Visit       Cardiovascular and Mediastinum   Essential hypertension    Blood pressure looks good today but is on the low end.  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  She was not sure if it was a side effect from the antibiotics.        Respiratory   Asthma in adult    Will go  ahead and refill DuoNebs she has been out for some time she does have a nebulizer at home that works.  I think it is worth trying to see if this is helpful with some of the cough and congestion she still experiencing.      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  A1c is 6.3 today which is fantastic.  She does feeling her appetite is starting to come back a little bit that she still really cannot taste her food very well.  She has been trying to drink plenty fluids and hydrate.      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      Relevant Orders   POCT HgB A1C (Completed)   POCT UA - Microalbumin (Completed)   Other Visit Diagnoses     Pneumonia of right middle lobe due to infectious organism       Relevant Medications   ipratropium-albuterol (DUONEB) 0.5-2.5 (3) MG/3ML SOLN   Lightheadedness          Pneumonia, right -again she is about 50% better lungs are clear on exam today which is reassuring she will finish up her antibiotics tonight.  She should hopefully continue to progress over the next 2 weeks but if not please let us know.  Orthostatics normal today. Will see if continues to feel better in the next couple of weeks  Ok to schedule flu shot for next week or two.  I really like for her to recover just a little bit more from pneumonia before we give her the flu vaccine so that she has a good immune response.  Return in about 4 months (around 10/04/2023) for Hypertension, Diabetes follow-up.    Nani Gasser, MD

## 2023-06-04 NOTE — Assessment & Plan Note (Signed)
Will go ahead and refill DuoNebs she has been out for some time she does have a nebulizer at home that works.  I think it is worth trying to see if this is helpful with some of the cough and congestion she still experiencing.

## 2023-06-04 NOTE — Assessment & Plan Note (Signed)
Kidney function is stable we just checked her labs last week serum creatinine was 0.9 plan to recheck again in 6 months.

## 2023-06-04 NOTE — Assessment & Plan Note (Signed)
Blood pressure looks good today but is on the low end.  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  She was not sure if it was a side effect from the antibiotics.

## 2023-06-08 ENCOUNTER — Other Ambulatory Visit: Payer: Self-pay | Admitting: Family Medicine

## 2023-06-08 DIAGNOSIS — I10 Essential (primary) hypertension: Secondary | ICD-10-CM

## 2023-06-09 ENCOUNTER — Other Ambulatory Visit: Payer: Self-pay | Admitting: Family Medicine

## 2023-06-09 DIAGNOSIS — E1149 Type 2 diabetes mellitus with other diabetic neurological complication: Secondary | ICD-10-CM

## 2023-06-19 ENCOUNTER — Ambulatory Visit (INDEPENDENT_AMBULATORY_CARE_PROVIDER_SITE_OTHER): Payer: Medicare PPO

## 2023-06-19 DIAGNOSIS — Z23 Encounter for immunization: Secondary | ICD-10-CM

## 2023-07-21 ENCOUNTER — Other Ambulatory Visit: Payer: Self-pay | Admitting: Family Medicine

## 2023-07-21 DIAGNOSIS — E1149 Type 2 diabetes mellitus with other diabetic neurological complication: Secondary | ICD-10-CM

## 2023-09-03 DIAGNOSIS — M7061 Trochanteric bursitis, right hip: Secondary | ICD-10-CM | POA: Diagnosis not present

## 2023-09-05 DIAGNOSIS — M7061 Trochanteric bursitis, right hip: Secondary | ICD-10-CM | POA: Diagnosis not present

## 2023-09-07 ENCOUNTER — Other Ambulatory Visit: Payer: Self-pay

## 2023-09-07 DIAGNOSIS — E1149 Type 2 diabetes mellitus with other diabetic neurological complication: Secondary | ICD-10-CM

## 2023-09-07 DIAGNOSIS — I10 Essential (primary) hypertension: Secondary | ICD-10-CM

## 2023-09-07 DIAGNOSIS — N1831 Chronic kidney disease, stage 3a: Secondary | ICD-10-CM

## 2023-09-07 MED ORDER — HYDROCHLOROTHIAZIDE 12.5 MG PO TABS: ORAL_TABLET | ORAL | 0 refills | Status: DC

## 2023-09-07 NOTE — Telephone Encounter (Signed)
Refill sent to pharmacy.   

## 2023-09-07 NOTE — Telephone Encounter (Signed)
Copied from CRM [id number]. Topic: Clinical - Medication Refill >> Sep 07, 2023 10:19 AM Thomes Dinning wrote: Most Recent Primary Care Visit:  Provider: Chalmers Cater  Department: [facility]  Visit Type: FLU SHOT  Date: 06/19/2023  Medication: hydrochlorothiazide (HYDRODIURIL) 12.5 MG tablet   Has the patient contacted their pharmacy? Yes (Agent: If no, request that the patient contact the pharmacy for the refill. If patient does not wish to contact the pharmacy document the reason why and proceed with request.) (Agent: If yes, when and what did the pharmacy advise?)  Is this the correct pharmacy for this prescription? Yes If no, delete pharmacy and type the correct one.  This is the patient's preferred pharmacy:  [id number] - [place], [place] - [address] [place] [address] Phone: [phone] Fax: [phone]   Has the prescription been filled recently? Yes  Is the patient out of the medication? Yes  Has the patient been seen for an appointment in the last year OR does the patient have an upcoming appointment? Yes  Can we respond through MyChart? No  Agent: Please be advised that Rx refills may take up to 3 business days. We ask that you follow-up with your pharmacy.

## 2023-10-04 ENCOUNTER — Ambulatory Visit: Payer: Medicare PPO | Admitting: Family Medicine

## 2023-10-11 ENCOUNTER — Encounter: Payer: Self-pay | Admitting: Family Medicine

## 2023-10-11 ENCOUNTER — Ambulatory Visit: Payer: Medicare PPO | Admitting: Family Medicine

## 2023-10-11 VITALS — BP 129/59 | HR 62 | Ht 65.0 in | Wt 157.0 lb

## 2023-10-11 DIAGNOSIS — E1149 Type 2 diabetes mellitus with other diabetic neurological complication: Secondary | ICD-10-CM | POA: Diagnosis not present

## 2023-10-11 DIAGNOSIS — E785 Hyperlipidemia, unspecified: Secondary | ICD-10-CM

## 2023-10-11 DIAGNOSIS — Z9181 History of falling: Secondary | ICD-10-CM | POA: Diagnosis not present

## 2023-10-11 DIAGNOSIS — Z7984 Long term (current) use of oral hypoglycemic drugs: Secondary | ICD-10-CM | POA: Diagnosis not present

## 2023-10-11 DIAGNOSIS — I10 Essential (primary) hypertension: Secondary | ICD-10-CM

## 2023-10-11 DIAGNOSIS — Z23 Encounter for immunization: Secondary | ICD-10-CM | POA: Diagnosis not present

## 2023-10-11 LAB — POCT GLYCOSYLATED HEMOGLOBIN (HGB A1C): Hemoglobin A1C: 6.4 % — AB (ref 4.0–5.6)

## 2023-10-11 MED ORDER — ROSUVASTATIN CALCIUM 20 MG PO TABS: 20.0000 mg | ORAL_TABLET | Freq: Every day | ORAL | 3 refills | Status: DC

## 2023-10-11 NOTE — Assessment & Plan Note (Signed)
A1c looks fantastic today at 6.4.  Continue to work on healthy diet and regular exercise continue current medication regimen and plan to follow-up in about 4 months.

## 2023-10-11 NOTE — Assessment & Plan Note (Signed)
Discussed switching from simvastatin 80 mg which can increase risk for myalgias to Crestor 20 mg which would have less risk for that she has been experiencing some cramping recently so I think it is reasonable to change medications and see if this is helpful.

## 2023-10-11 NOTE — Progress Notes (Signed)
Established Patient Office Visit  Subjective  Patient ID: [name], female    DOB: 02-27-1941  Age: 83 y.o. MRN: [id number]  Chief Complaint  Patient presents with   Hypertension   Diabetes    HPI  Hypertension- Pt denies chest pain, SOB, dizziness, or heart palpitations.  Taking meds as directed w/o problems.  Denies medication side effects.    Diabetes - no hypoglycemic events. No wounds or sores that are not healing well. No increased thirst or urination. Checking glucose at home. Taking medications as prescribed without any side effects.   She did want a let her know that she is fallen a couple times since she was last here once was a couple weeks ago she had gotten out of bed and she says normally she takes her time and did but when she got completely up to grab her walker to go to the bathroom she fell.  The area was well lit that was not an issue she does not member feeling lightheaded or dizzy.  She said she fell 1 other time right around Christmas when she was outside try to put decorations up but she thinks her foot slipped on some gravel that time.    ROS    Objective:     BP (!) 129/59   Pulse 62   Ht 5\' 5"  (1.651 m)   Wt 157 lb (71.2 kg)   SpO2 100%   BMI 26.13 kg/m    Physical Exam Vitals and nursing note reviewed.  Constitutional:      Appearance: Normal appearance.  HENT:     Head: Normocephalic and atraumatic.  Eyes:     Conjunctiva/sclera: Conjunctivae normal.  Cardiovascular:     Rate and Rhythm: Normal rate and regular rhythm.  Pulmonary:     Effort: Pulmonary effort is normal.     Breath sounds: Normal breath sounds.  Skin:    General: Skin is warm and dry.  Neurological:     Mental Status: She is alert.  Psychiatric:        Mood and Affect: Mood normal.      Results for orders placed or performed in visit on 10/11/23  POCT HgB A1C  Result Value Ref Range   Hemoglobin A1C 6.4 (A) 4.0 - 5.6 %   HbA1c POC (<> result, manual  entry)     HbA1c, POC (prediabetic range)     HbA1c, POC (controlled diabetic range)        The ASCVD Risk score (Arnett DK, et al., 2019) failed to calculate for the following reasons:   The 2019 ASCVD risk score is only valid for ages 3 to 81    Assessment & Plan:   Problem List Items Addressed This Visit       Cardiovascular and Mediastinum   Essential hypertension   Well controlled. Continue current regimen. Follow up in  3mo  Will do orthostatics today with her recent fall in the middle of the night.      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  Continue to work on healthy diet and regular exercise continue current medication regimen and plan to follow-up in about 4 months.      Relevant Medications   rosuvastatin (CRESTOR) 20  MG tablet   Other Relevant Orders   POCT HgB A1C (Completed)   CMP14+EGFR   Lipid panel   CBC     Other   Hyperlipidemia   Discussed switching from simvastatin 80 mg which can increase risk for myalgias to Crestor 20 mg which would have less risk for that she has been experiencing some cramping recently so I think it is reasonable to change medications and see if this is helpful.      Relevant Medications   rosuvastatin (CRESTOR) 20 MG tablet   Other Relevant Orders   CMP14+EGFR   Lipid panel   CBC   Other Visit Diagnoses       Encounter for immunization       Relevant Orders   Pneumococcal conjugate vaccine 20-valent (Completed)     History of recent fall           Recent fall-unclear etiology.  She is not sure exactly what happened she does remember feeling lightheaded or dizzy but we did go out and do orthostatics on her today.  Her blood pressure actually went up with position change instead of down.  So for now we will  continue to just monitor.  Let us know if anything feels off or different or if she is experiencing any weakness numbness. Return in about 4 months (around 02/08/2024) for bp/dm.    Nani Gasser, MD

## 2023-10-11 NOTE — Assessment & Plan Note (Signed)
Well controlled. Continue current regimen. Follow up in  13mo  Will do orthostatics today with her recent fall in the middle of the night.

## 2023-10-12 LAB — CMP14+EGFR
ALT: 8 [IU]/L (ref 0–32)
AST: 9 [IU]/L (ref 0–40)
Albumin: 4.3 g/dL (ref 3.7–4.7)
Alkaline Phosphatase: 105 [IU]/L (ref 44–121)
BUN/Creatinine Ratio: 22 (ref 12–28)
BUN: 19 mg/dL (ref 8–27)
Bilirubin Total: <0.2 mg/dL (ref 0.0–1.2)
CO2: 21 mmol/L (ref 20–29)
Calcium: 9.8 mg/dL (ref 8.7–10.3)
Chloride: 100 mmol/L (ref 96–106)
Creatinine, Ser: 0.87 mg/dL (ref 0.57–1.00)
Globulin, Total: 2.4 g/dL (ref 1.5–4.5)
Glucose: 208 mg/dL — ABNORMAL HIGH (ref 70–99)
Potassium: 4.3 mmol/L (ref 3.5–5.2)
Sodium: 141 mmol/L (ref 134–144)
Total Protein: 6.7 g/dL (ref 6.0–8.5)
eGFR: 65 mL/min/{1.73_m2} (ref >59)

## 2023-10-12 LAB — LIPID PANEL
Chol/HDL Ratio: 3.5 {ratio} (ref 0.0–4.4)
Cholesterol, Total: 126 mg/dL (ref 100–199)
HDL: 36 mg/dL — ABNORMAL LOW (ref >39)
LDL Chol Calc (NIH): 60 mg/dL (ref 0–99)
Triglycerides: 176 mg/dL — ABNORMAL HIGH (ref 0–149)
VLDL Cholesterol Cal: 30 mg/dL (ref 5–40)

## 2023-10-12 LAB — CBC
Hematocrit: 40.9 % (ref 34.0–46.6)
Hemoglobin: 13 g/dL (ref 11.1–15.9)
MCH: 28.4 pg (ref 26.6–33.0)
MCHC: 31.8 g/dL (ref 31.5–35.7)
MCV: 89 fL (ref 79–97)
Platelets: 351 10*3/uL (ref 150–450)
RBC: 4.58 x10E6/uL (ref 3.77–5.28)
RDW: 13.2 % (ref 11.7–15.4)
WBC: 9.9 10*3/uL (ref 3.4–10.8)

## 2023-10-12 NOTE — Progress Notes (Signed)
Call patient: Metabolic panel overall looks good except glucose was over 200.  Continue to work on good healthy food choices.  LDL looks great but triglycerides are up just a little bit again continue to work on diet and exercise.  Blood count is normal no sign of anemia.

## 2023-11-30 ENCOUNTER — Other Ambulatory Visit: Payer: Self-pay | Admitting: Family Medicine

## 2023-11-30 DIAGNOSIS — N1831 Chronic kidney disease, stage 3a: Secondary | ICD-10-CM

## 2023-11-30 DIAGNOSIS — I10 Essential (primary) hypertension: Secondary | ICD-10-CM

## 2023-11-30 DIAGNOSIS — E1149 Type 2 diabetes mellitus with other diabetic neurological complication: Secondary | ICD-10-CM

## 2023-12-19 ENCOUNTER — Other Ambulatory Visit: Payer: Self-pay | Admitting: Family Medicine

## 2023-12-19 DIAGNOSIS — I10 Essential (primary) hypertension: Secondary | ICD-10-CM

## 2024-01-09 ENCOUNTER — Ambulatory Visit: Payer: Medicare PPO

## 2024-01-09 VITALS — Ht 65.0 in | Wt 160.0 lb

## 2024-01-09 DIAGNOSIS — Z0000 Encounter for general adult medical examination without abnormal findings: Secondary | ICD-10-CM | POA: Diagnosis not present

## 2024-01-09 NOTE — Progress Notes (Signed)
 Subjective:   [name] is a 83 y.o. female who presents for Medicare Annual (Subsequent) preventive examination.  Visit Complete: Virtual I connected with  [name] on 01/09/24 by a audio enabled telemedicine application and verified that I am speaking with the correct person using two identifiers.  Patient Location: Home  Provider Location: Office/Clinic  I discussed the limitations of evaluation and management by telemedicine. The patient expressed understanding and agreed to proceed.  Vital Signs: Because this visit was a virtual/telehealth visit, some criteria may be missing or patient reported. Any vitals not documented were not able to be obtained and vitals that have been documented are patient reported.  Patient Medicare AWV questionnaire was completed by the patient on n/a; I have confirmed that all information answered by patient is correct and no changes since this date.  Cardiac Risk Factors include: advanced age (>1men, >77 women);diabetes mellitus;hypertension;family history of premature cardiovascular disease     Objective:    Today's Vitals   01/09/24 1052  Weight: 160 lb (72.6 kg)  Height: 5\' 5"  (1.651 m)   Body mass index is 26.63 kg/m.     01/09/2024   11:01 AM 01/08/2023   11:05 AM 02/07/2022   11:37 AM 01/02/2022   10:38 AM 12/08/2020   11:07 AM 03/11/2020    8:54 AM 05/01/2019    3:48 PM  Advanced Directives  Does Patient Have a Medical Advance Directive? Yes Yes Yes Yes Yes Yes Yes  Type of Estate agent of [place];Living will Living will  Living will;Healthcare Power of [employer] Power of [place];Living will Healthcare Power of [place];Living will Healthcare Power of Attorney  Does patient want to make changes to medical advance directive? No - Patient declined No - Patient declined  No - Patient declined No - Patient declined No - Patient declined No - Patient declined  Copy of Healthcare Power of Attorney  in Chart? No - copy requested   No - copy requested No - copy requested No - copy requested     Current Medications (verified) Outpatient Encounter Medications as of 01/09/2024  Medication Sig   Accu-Chek FastClix Lancets MISC For checking blood sugars up to 3 times daily. E11.49   ACCU-CHEK GUIDE test strip Test twice daily.  DX: E11.49   amLODipine  (NORVASC ) 2.5 MG tablet TAKE 1 TABLET(2.5 MG) BY MOUTH DAILY   aspirin  81 MG chewable tablet Chew 1 tablet by mouth daily.   blood glucose meter kit and supplies Dispense based on patient and insurance preference. For testing up to twice daily. E11.49   carvedilol  (COREG ) 6.25 MG tablet TAKE 1 TABLET(6.25 MG) BY MOUTH TWICE DAILY WITH A MEAL   dorzolamide (TRUSOPT) 2 % ophthalmic solution 1 drop 2 (two) times daily.   gabapentin  (NEURONTIN ) 300 MG capsule TAKE 1 CAPSULE(300 MG) BY MOUTH TWICE DAILY   hydrochlorothiazide  (HYDRODIURIL ) 12.5 MG tablet TAKE 1 TABLET(12.5 MG) BY MOUTH DAILY   ipratropium-albuterol  (DUONEB) 0.5-2.5 (3) MG/3ML SOLN Take 3 mLs by nebulization every 6 (six) hours as needed.   JARDIANCE  10 MG TABS tablet TAKE 1 TABLET(10 MG) BY MOUTH DAILY BEFORE BREAKFAST   metFORMIN  (GLUCOPHAGE ) 1000 MG tablet Take 1 tablet (1,000 mg total) by mouth 2 (two) times daily with a meal.   rosuvastatin  (CRESTOR ) 20 MG tablet Take 1 tablet (20 mg total) by mouth daily.   No facility-administered encounter medications on file as of 01/09/2024.    Allergies (verified) Losartan , Nsaids, Levofloxacin, Oxycodone  hcl, Verapamil, Biaxin [  clarithromycin], Oxycontin  [oxycodone ], and Vicodin [hydrocodone -acetaminophen ]   History: Past Medical History:  Diagnosis Date   Arthritis    "back; left hip; knees" (12/04/2013)   Asthma    Chronic bronchitis (HCC)    "usually get it twice/yr" (12/04/2013)   Diabetes mellitus without complication (HCC)    Environmental and seasonal allergies    GERD (gastroesophageal reflux disease)    High cholesterol     Hypertension    pcp   james manning      Pneumonia    as a child   Past Surgical History:  Procedure Laterality Date   ABDOMINAL HYSTERECTOMY     BLADDER SUSPENSION     COLONOSCOPY     JOINT REPLACEMENT     SHOULDER ARTHROSCOPY W/ ROTATOR CUFF REPAIR Right    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   Procedure: TOTAL HIP ARTHROPLASTY;  Surgeon: Timothy Ford, MD;  Location: [facility] OR;  Service: Orthopedics;  Laterality: Left;  Left Total Hip Arthroplasty   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   Procedure: TOTAL KNEE ARTHROPLASTY;  Surgeon: Timothy Ford, MD;  Location: [facility] OR;  Service: Orthopedics;  Laterality: Right;  Right Total Knee Arthroplasty   Family History  Problem Relation Age of Onset   Hypertension Mother    Heart disease Mother    Transient ischemic attack Mother    CAD Father    Cancer - Ovarian Sister    Diabetes Daughter    Social History   Socioeconomic History   Marital status: Widowed    Spouse name: Not on file   Number of children: 4   Years of education: 12th Grade   Highest education level: High school graduate  Occupational History    Comment: Retired  Tobacco Use   Smoking status: Never   Smokeless tobacco: Never  Vaping Use   Vaping status: Never Used  Substance and Sexual Activity   Alcohol use: No   Drug use: No   Sexual activity: Not on file  Other Topics Concern   Not on file  Social History Narrative   Retired. Widowed. Husband died 2019/01/16. Lives alone. She enjoys doing puzzles and yardwork.   Social Drivers of Corporate investment banker Strain: Low Risk  (01/09/2024)   Overall Financial Resource Strain (CARDIA)    Difficulty of Paying Living Expenses: Not hard at all  Food Insecurity: No Food Insecurity (01/09/2024)   Hunger Vital Sign    Worried About Running Out of Food in the Last Year: Never true    Ran Out of Food in the Last Year: Never true  Transportation Needs: No  Transportation Needs (01/09/2024)   PRAPARE - Administrator, Civil Service (Medical): No    Lack of Transportation (Non-Medical): No  Physical Activity: Inactive (01/09/2024)   Exercise Vital Sign    Days of Exercise per Week: 0 days    Minutes of Exercise per Session: 0 min  Stress: No Stress Concern Present (01/09/2024)   [employer] of Occupational Health - Occupational Stress Questionnaire    Feeling of Stress : Not at all  Social Connections: Moderately Integrated (01/09/2024)   Social Connection and Isolation Panel [NHANES]    Frequency of Communication with Friends and Family: More than three times a week    Frequency of Social Gatherings with Friends and Family: More than three times a week    Attends Religious Services: More than 4 times per year  Active Member of Clubs or Organizations: Yes    Attends Banker Meetings: More than 4 times per year    Marital Status: Widowed    Tobacco Counseling Counseling given: Not Answered   Clinical Intake:  Pre-visit preparation completed: Yes  Pain : No/denies pain     BMI - recorded: 26.63 Nutritional Status: BMI 25 -29 Overweight Nutritional Risks: None Diabetes: Yes CBG done?: Yes (128 mg/dl) CBG resulted in Enter/ Edit results?: No Did pt. bring in CBG monitor from home?: No  What is the last grade level you completed in school?: 12  Interpreter Needed?: No      Activities of Daily Living    01/09/2024   10:55 AM  In your present state of health, do you have any difficulty performing the following activities:  Hearing? 0  Vision? 0  Difficulty concentrating or making decisions? 0  Walking or climbing stairs? 1  Dressing or bathing? 0  Doing errands, shopping? 0  Preparing Food and eating ? N  Using the Toilet? N  In the past six months, have you accidently leaked urine? Y  Do you have problems with loss of bowel control? N  Managing your Medications? N  Managing your  Finances? N  Housekeeping or managing your Housekeeping? N    Patient Care Team: Cydney Draft, MD as PCP - General (Family Medicine) Rabon Bud, [place] (Orthopedic Surgery) Merline Starr, MD as Referring Physician (Cardiology) Jennefer Moats, DPM as Consulting Physician (Podiatry)  Indicate any recent Medical Services you may have received from other than [facility] providers in the past year (date may be approximate).     Assessment:   This is a routine wellness examination for [place].  Hearing/Vision screen No results found.   Goals Addressed             This Visit's Progress    Patient Stated       Patient stated she would like to be able to get around a little better. She has balance issues.       Depression Screen    01/09/2024   11:00 AM 01/31/2023    9:31 AM 01/08/2023   11:06 AM 05/02/2022   11:27 AM 03/15/2022   10:50 AM 01/02/2022   10:38 AM 07/14/2021    9:12 AM  PHQ 2/9 Scores  PHQ - 2 Score 0 0 0 0 0 0 0    Fall Risk    01/09/2024   11:02 AM 01/31/2023    9:31 AM 01/08/2023   11:06 AM 03/15/2022   10:50 AM 01/05/2022   11:12 AM  Fall Risk   Falls in the past year? 1 0 0 0 1  Number falls in past yr: 1 0 0 0 0  Injury with Fall? 1 0 0 0 1  Risk for fall due to : Impaired balance/gait No Fall Risks No Fall Risks History of fall(s) Impaired balance/gait;Impaired mobility  Follow up Falls evaluation completed Falls evaluation completed Falls evaluation completed Falls prevention discussed;Falls evaluation completed Falls prevention discussed;Falls evaluation completed    MEDICARE RISK AT HOME: Medicare Risk at Home Any stairs in or around the home?: No Home free of loose throw rugs in walkways, pet beds, electrical cords, etc?: Yes Adequate lighting in your home to reduce risk of falls?: Yes Life alert?: No Use of a cane, walker or w/c?: Yes Grab bars in the bathroom?: Yes Shower chair or bench in shower?: Yes Elevated toilet seat or a  handicapped toilet?: Yes  TIMED UP AND GO:  Was the test performed?  No    Cognitive Function:        01/09/2024   11:03 AM 01/08/2023   11:11 AM 01/02/2022   10:43 AM 12/08/2020   11:16 AM  6CIT Screen  What Year? 0 points 0 points 0 points 0 points  What month? 0 points 0 points 0 points 0 points  What time? 0 points 0 points 0 points 0 points  Count back from 20 0 points 0 points 0 points 0 points  Months in reverse 0 points 0 points 0 points 0 points  Repeat phrase 0 points 2 points 0 points 0 points  Total Score 0 points 2 points 0 points 0 points    Immunizations Immunization History  Administered Date(s) Administered   Fluad Quad(high Dose 65+) 06/19/2019, 06/23/2020, 07/14/2021, 08/02/2022   Fluad Trivalent(High Dose 65+) 06/19/2023   Influenza, High Dose Seasonal PF 06/12/2016, 06/04/2017, 06/06/2018   Influenza,trivalent, recombinat, inj, PF 07/01/2012, 06/16/2013, 05/28/2014, 05/31/2015   Influenza-Unspecified 05/19/2013   PFIZER(Purple Top)SARS-COV-2 Vaccination 10/13/2019, 11/03/2019   PNEUMOCOCCAL CONJUGATE-20 10/11/2023   Pneumococcal Conjugate-13 07/27/2014   Pneumococcal Polysaccharide-23 10/23/2011   Pneumococcal-Unspecified 10/23/2011   Tdap 10/23/2011    TDAP status: Due, Education has been provided regarding the importance of this vaccine. Advised may receive this vaccine at local pharmacy or Health Dept. Aware to provide a copy of the vaccination record if obtained from local pharmacy or Health Dept. Verbalized acceptance and understanding.  Flu Vaccine status: Up to date  Pneumococcal vaccine status: Up to date  Covid-19 vaccine status: Completed vaccines  Qualifies for Shingles Vaccine? No   Zostavax completed No   Shingrix Completed?: No.    Education has been provided regarding the importance of this vaccine. Patient has been advised to call insurance company to determine out of pocket expense if they have not yet received this vaccine. Advised  may also receive vaccine at local pharmacy or Health Dept. Verbalized acceptance and understanding.  Screening Tests Health Maintenance  Topic Date Due   Zoster Vaccines- Shingrix (1 of 2) Never done   DTaP/Tdap/Td (2 - Td or Tdap) 10/22/2021   OPHTHALMOLOGY EXAM  09/19/2022   COVID-19 Vaccine (3 - 2024-25 season) 05/20/2023   FOOT EXAM  01/31/2024   HEMOGLOBIN A1C  04/09/2024   INFLUENZA VACCINE  04/18/2024   Diabetic kidney evaluation - Urine ACR  06/03/2024   Diabetic kidney evaluation - eGFR measurement  10/10/2024   Medicare Annual Wellness (AWV)  01/08/2025   Pneumonia Vaccine 71+ Years old  Completed   DEXA SCAN  Completed   HPV VACCINES  Aged Out   Meningococcal B Vaccine  Aged Out    Health Maintenance  Health Maintenance Due  Topic Date Due   Zoster Vaccines- Shingrix (1 of 2) Never done   DTaP/Tdap/Td (2 - Td or Tdap) 10/22/2021   OPHTHALMOLOGY EXAM  09/19/2022   COVID-19 Vaccine (3 - 2024-25 season) 05/20/2023    Colorectal cancer screening: No longer required.   Mammogram status: No longer required due to age.  Bone Density status: Completed 01/31/2023. Results reflect: Bone density results: NORMAL. Repeat every 2 years.  Lung Cancer Screening: (Low Dose CT Chest recommended if Age 99-80 years, 20 pack-year currently smoking OR have quit w/in 15years.) does not qualify.   Lung Cancer Screening Referral: n/a  Additional Screening:  Hepatitis C Screening: does not qualify; Completed   Vision Screening: Recommended annual ophthalmology exams for early  detection of glaucoma and other disorders of the eye. Is the patient up to date with their annual eye exam?  Yes  Who is the provider or what is the name of the office in which the patient attends annual eye exams? Dr [facility] If pt is not established with a provider, would they like to be referred to a provider to establish care?  N/a .   Dental Screening: Recommended annual dental exams for  proper oral hygiene  Diabetic Foot Exam: Diabetic Foot Exam: Completed 01/31/2023  Community Resource Referral / Chronic Care Management: CRR required this visit?  No   CCM required this visit?  No     Plan:     I have personally reviewed and noted the following in the patient's chart:   Medical and social history Use of alcohol, tobacco or illicit drugs  Current medications and supplements including opioid prescriptions. Patient is not currently taking opioid prescriptions. Functional ability and status Nutritional status Physical activity Advanced directives List of other physicians Hospitalizations, surgeries, and ER visits in previous 12 months Vitals Screenings to include cognitive, depression, and falls Referrals and appointments  In addition, I have reviewed and discussed with patient certain preventive protocols, quality metrics, and best practice recommendations. A written personalized care plan for preventive services as well as general preventive health recommendations were provided to patient.     Aubrey Leaf, CMA   01/09/2024   After Visit Summary: (Mail) Due to this being a telephonic visit, the after visit summary with patients personalized plan was offered to patient via mail   Nurse Notes:    LAKRESHA STIFTER is a 83 y.o. female patient of Metheney, Corita Diego, MD who had a Medicare Annual Wellness Visit today via telephone. Marletta is Retired and lives alone. She has 4 children. She reports that she is socially active and does interact with friends/family regularly. She is minimally physically active and enjoys doing puzzles.

## 2024-01-09 NOTE — Patient Instructions (Signed)
 [name] , Thank you for taking time to come for your Medicare Wellness Visit. I appreciate your ongoing commitment to your health goals. Please review the following plan we discussed and let me know if I can assist you in the future.   These are the goals we discussed:  Goals       Patient Stated (pt-stated)      12/08/2020 AWV Goal: Exercise for General Health  Patient will verbalize understanding of the benefits of increased physical activity: Exercising regularly is important. It will improve your overall fitness, flexibility, and endurance. Regular exercise also will improve your overall health. It can help you control your weight, reduce stress, and improve your bone density. Over the next year, patient will increase physical activity as tolerated with a goal of at least 150 minutes of moderate physical activity per week.  You can tell that you are exercising at a moderate intensity if your heart starts beating faster and you start breathing faster but can still hold a conversation. Moderate-intensity exercise ideas include: Walking 1 mile (1.6 km) in about 15 minutes Biking Hiking Golfing Dancing Water  aerobics Patient will verbalize understanding of everyday activities that increase physical activity by providing examples like the following: Yard work, such as: Insurance underwriter Gardening Washing windows or floors Patient will be able to explain general safety guidelines for exercising:  Before you start a new exercise program, talk with your health care provider. Do not exercise so much that you hurt yourself, feel dizzy, or get very short of breath. Wear comfortable clothes and wear shoes with good support. Drink plenty of water  while you exercise to prevent dehydration or heat stroke. Work out until your breathing and your heartbeat get faster.       Patient Stated (pt-stated)       01/02/2022 AWV Goal: Exercise for General Health  Patient will verbalize understanding of the benefits of increased physical activity: Exercising regularly is important. It will improve your overall fitness, flexibility, and endurance. Regular exercise also will improve your overall health. It can help you control your weight, reduce stress, and improve your bone density. Over the next year, patient will increase physical activity as tolerated with a goal of at least 150 minutes of moderate physical activity per week.  You can tell that you are exercising at a moderate intensity if your heart starts beating faster and you start breathing faster but can still hold a conversation. Moderate-intensity exercise ideas include: Walking 1 mile (1.6 km) in about 15 minutes Biking Hiking Golfing Dancing Water  aerobics Patient will verbalize understanding of everyday activities that increase physical activity by providing examples like the following: Yard work, such as: Insurance underwriter Gardening Washing windows or floors Patient will be able to explain general safety guidelines for exercising:  Before you start a new exercise program, talk with your health care provider. Do not exercise so much that you hurt yourself, feel dizzy, or get very short of breath. Wear comfortable clothes and wear shoes with good support. Drink plenty of water  while you exercise to prevent dehydration or heat stroke. Work out until your breathing and your heartbeat get faster.       Patient Stated (pt-stated)      Patient stated that she would like to work on her balance.      Patient Stated  Patient stated she would like to be able to get around a little better. She has balance issues.         This is a list of the screening recommended for you and due dates:  Health Maintenance  Topic Date Due   Zoster (Shingles) Vaccine  (1 of 2) Never done   DTaP/Tdap/Td vaccine (2 - Td or Tdap) 10/22/2021   Eye exam for diabetics  09/19/2022   COVID-19 Vaccine (3 - 2024-25 season) 05/20/2023   Complete foot exam   01/31/2024   Hemoglobin A1C  04/09/2024   Flu Shot  04/18/2024   Yearly kidney health urinalysis for diabetes  06/03/2024   Yearly kidney function blood test for diabetes  10/10/2024   Medicare Annual Wellness Visit  01/08/2025   Pneumonia Vaccine  Completed   DEXA scan (bone density measurement)  Completed   HPV Vaccine  Aged Out   Meningitis B Vaccine  Aged Out

## 2024-02-06 DIAGNOSIS — E119 Type 2 diabetes mellitus without complications: Secondary | ICD-10-CM | POA: Diagnosis not present

## 2024-02-06 DIAGNOSIS — H5203 Hypermetropia, bilateral: Secondary | ICD-10-CM | POA: Diagnosis not present

## 2024-02-06 DIAGNOSIS — H401133 Primary open-angle glaucoma, bilateral, severe stage: Secondary | ICD-10-CM | POA: Diagnosis not present

## 2024-02-06 DIAGNOSIS — H524 Presbyopia: Secondary | ICD-10-CM | POA: Diagnosis not present

## 2024-02-07 ENCOUNTER — Encounter: Payer: Self-pay | Admitting: Family Medicine

## 2024-02-07 ENCOUNTER — Ambulatory Visit: Payer: Medicare PPO | Admitting: Family Medicine

## 2024-02-07 VITALS — BP 130/63 | HR 55 | Ht 65.0 in | Wt 156.1 lb

## 2024-02-07 DIAGNOSIS — I10 Essential (primary) hypertension: Secondary | ICD-10-CM | POA: Diagnosis not present

## 2024-02-07 DIAGNOSIS — N1831 Chronic kidney disease, stage 3a: Secondary | ICD-10-CM

## 2024-02-07 DIAGNOSIS — E1149 Type 2 diabetes mellitus with other diabetic neurological complication: Secondary | ICD-10-CM

## 2024-02-07 DIAGNOSIS — Z7984 Long term (current) use of oral hypoglycemic drugs: Secondary | ICD-10-CM

## 2024-02-07 DIAGNOSIS — E1140 Type 2 diabetes mellitus with diabetic neuropathy, unspecified: Secondary | ICD-10-CM | POA: Diagnosis not present

## 2024-02-07 LAB — POCT GLYCOSYLATED HEMOGLOBIN (HGB A1C): Hemoglobin A1C: 6.3 % — AB (ref 4.0–5.6)

## 2024-02-07 NOTE — Progress Notes (Signed)
 Established Patient Office Visit  Subjective  Patient ID: [name], female    DOB: [birth year]  Age: 83 y.o. MRN: [id number]  Chief Complaint  Patient presents with   Diabetes    HPI  Hypertension- Pt denies chest pain, SOB, dizziness, or heart palpitations.  Taking meds as directed w/o problems.  Denies medication side effects.     Diabetes - no hypoglycemic events. No wounds or sores that are not healing well. No increased thirst or urination. Checking glucose at home. Taking medications as prescribed without any side effects. Foot exam performed today. Pain in her big toe and into her arch.    She has a friend with her today visiting from [place]  she is gena be staying with her for about a month.  She is getting a lot of pain particularly in her big toes in the late afternoon and early evenings.  She is already on gabapentin  she does see at the podiatrist and says in fact she needs to make an appointment to get her nails trimmed.  No recent injury or trauma.     ROS    Objective:     BP 130/63   Pulse (!) 55   Ht 5\' 5"  (1.651 m)   Wt 156 lb 1.9 oz (70.8 kg)   SpO2 95%   BMI 25.98 kg/m    Physical Exam Vitals and nursing note reviewed.  Constitutional:      Appearance: Normal appearance.  HENT:     Head: Normocephalic and atraumatic.  Eyes:     Conjunctiva/sclera: Conjunctivae normal.  Cardiovascular:     Rate and Rhythm: Normal rate and regular rhythm.  Pulmonary:     Effort: Pulmonary effort is normal.     Breath sounds: Normal breath sounds.  Skin:    General: Skin is warm and dry.  Neurological:     Mental Status: She is alert.  Psychiatric:        Mood and Affect: Mood normal.      Results for orders placed or performed in visit on 02/07/24  POCT HgB A1C  Result Value Ref Range   Hemoglobin A1C 6.3 (A) 4.0 - 5.6 %   HbA1c POC (<> result, manual entry)     HbA1c, POC (prediabetic range)     HbA1c, POC (controlled diabetic range)         The ASCVD Risk score (Arnett DK, et al., 2019) failed to calculate for the following reasons:   The 2019 ASCVD risk score is only valid for ages 24 to 75    Assessment & Plan:   Problem List Items Addressed This Visit       Cardiovascular and Mediastinum   Essential hypertension - Primary   Blood pressure at goal today.  Looks great.  Continue current regimen.  Of otherwise doing well follow back up in 4 months.        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  No hypoglycemic events.      Type 2 diabetes mellitus with diabetic neuropathy, unspecified (HCC)   A1 C looks great today at 6.3 she is doing a fantastic job.  Call if any problems or concerns.  I did encourage her to speak with the podiatrist and have them take a look at her feet just to make sure that she does not have any type of underlying issue like a neuroma or may be just needs  additional support in her shoe wear that might be unrelated to the neuropathy that could be contributing to her pain.  We did discuss options for the neuropathy including doing a trial of a topical cream such as capsaicin  and if that is not helpful then we could consider adding another dose of gabapentin  midday.  She says it never really made her tired or sleepy so she might be a good candidate for that we would just need to monitor carefully.      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  Last known creatinine was 0.8.  GFR was 65.       Did encourage her to get her Tdap updated.  Return in about 4 months (around 06/09/2024) for Diabetes follow-up, Hypertension.    Duaine German, MD

## 2024-02-07 NOTE — Patient Instructions (Addendum)
   Please consider getting your Tdap and your shingles vaccine at the local pharmacy.  Both of these are 100% covered under Medicare part B if you have it done at the pharmacy.  [facility] pharmacy would be a great place to go to have that done.  Capsaicin  cream is a topical analgesic used to relieve pain  from neuropathy. It works by helping to block pain signals in the body.   Please also talk to the Podiatrist about your foot pain.

## 2024-02-07 NOTE — Assessment & Plan Note (Signed)
 Currently controlled on metformin  and Jardiance .  No hypoglycemic events.

## 2024-02-07 NOTE — Assessment & Plan Note (Signed)
 Blood pressure at goal today.  Looks great.  Continue current regimen.  Of otherwise doing well follow back up in 4 months.

## 2024-02-07 NOTE — Assessment & Plan Note (Signed)
 Continue to monitor renal function.  Last known creatinine was 0.8.  GFR was 65.

## 2024-02-07 NOTE — Assessment & Plan Note (Addendum)
 A1 C looks great today at 6.3 she is doing a fantastic job.  Call if any problems or concerns.  I did encourage her to speak with the podiatrist and have them take a look at her feet just to make sure that she does not have any type of underlying issue like a neuroma or may be just needs additional support in her shoe wear that might be unrelated to the neuropathy that could be contributing to her pain.  We did discuss options for the neuropathy including doing a trial of a topical cream such as capsaicin  and if that is not helpful then we could consider adding another dose of gabapentin  midday.  She says it never really made her tired or sleepy so she might be a good candidate for that we would just need to monitor carefully.

## 2024-02-21 ENCOUNTER — Other Ambulatory Visit: Payer: Self-pay | Admitting: Family Medicine

## 2024-02-21 DIAGNOSIS — E1149 Type 2 diabetes mellitus with other diabetic neurological complication: Secondary | ICD-10-CM

## 2024-02-21 DIAGNOSIS — N1831 Chronic kidney disease, stage 3a: Secondary | ICD-10-CM

## 2024-02-21 DIAGNOSIS — I10 Essential (primary) hypertension: Secondary | ICD-10-CM

## 2024-02-29 ENCOUNTER — Ambulatory Visit: Admitting: Podiatry

## 2024-02-29 DIAGNOSIS — M79675 Pain in left toe(s): Secondary | ICD-10-CM | POA: Diagnosis not present

## 2024-02-29 DIAGNOSIS — E1149 Type 2 diabetes mellitus with other diabetic neurological complication: Secondary | ICD-10-CM | POA: Diagnosis not present

## 2024-02-29 DIAGNOSIS — B351 Tinea unguium: Secondary | ICD-10-CM

## 2024-02-29 DIAGNOSIS — M79674 Pain in right toe(s): Secondary | ICD-10-CM

## 2024-02-29 NOTE — Progress Notes (Signed)
 Subjective: 83 y.o. returns the office today for painful, elongated, thickened toenails which she cannot trim herself. Denies any redness or drainage around the nails. She is still on gabapentin  twice a day which helps some.  Denies any acute changes since last appointment and no new complaints today. Denies any systemic complaints such as fevers, chills, nausea, vomiting.   PCP: Cydney Draft, MD Last Seen:03/15/22  A1c: 6.8 on 01/24/22  Objective: AAO 3, NAD DP/PT pulses palpable, CRT less than 3 seconds Nails hypertrophic, dystrophic, elongated, brittle, discolored 10. There is tenderness overlying the nails 1-5 bilaterally. There is no surrounding erythema or drainage along the nail sites. No area pinpoint tenderness.  No edema, erythema.  No other areas of discomfort identified on today's exam. No open lesions or pre-ulcerative lesions are identified. No pain with calf compression, swelling, warmth, erythema.  Assessment: Patient presents with symptomatic onychomycosis; neuropathy   Plan: -Treatment options including alternatives, risks, complications were discussed -Nails sharply debrided 10 without complication/bleeding. -Continue gabapentin .  . -Discussed daily foot inspection. If there are any changes, to call the office immediately.  -Follow-up in 3 months or sooner if any problems are to arise. In the meantime, encouraged to call the office with any questions, concerns, changes symptoms.  Jennefer Moats, DPM

## 2024-03-05 DIAGNOSIS — G8929 Other chronic pain: Secondary | ICD-10-CM | POA: Diagnosis not present

## 2024-03-05 DIAGNOSIS — M7061 Trochanteric bursitis, right hip: Secondary | ICD-10-CM | POA: Diagnosis not present

## 2024-03-05 DIAGNOSIS — M545 Low back pain, unspecified: Secondary | ICD-10-CM | POA: Diagnosis not present

## 2024-05-10 ENCOUNTER — Encounter (HOSPITAL_BASED_OUTPATIENT_CLINIC_OR_DEPARTMENT_OTHER): Payer: Self-pay | Admitting: Emergency Medicine

## 2024-05-10 ENCOUNTER — Emergency Department (HOSPITAL_BASED_OUTPATIENT_CLINIC_OR_DEPARTMENT_OTHER)

## 2024-05-10 ENCOUNTER — Other Ambulatory Visit: Payer: Self-pay

## 2024-05-10 ENCOUNTER — Emergency Department (HOSPITAL_BASED_OUTPATIENT_CLINIC_OR_DEPARTMENT_OTHER): Admission: EM | Admit: 2024-05-10 | Discharge: 2024-05-10 | Disposition: A | Discharge status: Home / Self Care

## 2024-05-10 ENCOUNTER — Ambulatory Visit: Admission: EM | Admit: 2024-05-10 | Discharge: 2024-05-10 | Disposition: A | Discharge status: Home / Self Care

## 2024-05-10 DIAGNOSIS — Z79899 Other long term (current) drug therapy: Secondary | ICD-10-CM | POA: Insufficient documentation

## 2024-05-10 DIAGNOSIS — I160 Hypertensive urgency: Secondary | ICD-10-CM | POA: Diagnosis not present

## 2024-05-10 DIAGNOSIS — I10 Essential (primary) hypertension: Secondary | ICD-10-CM | POA: Insufficient documentation

## 2024-05-10 DIAGNOSIS — R519 Headache, unspecified: Secondary | ICD-10-CM | POA: Insufficient documentation

## 2024-05-10 DIAGNOSIS — Z7982 Long term (current) use of aspirin: Secondary | ICD-10-CM | POA: Diagnosis not present

## 2024-05-10 DIAGNOSIS — I6782 Cerebral ischemia: Secondary | ICD-10-CM | POA: Diagnosis not present

## 2024-05-10 DIAGNOSIS — I6523 Occlusion and stenosis of bilateral carotid arteries: Secondary | ICD-10-CM | POA: Diagnosis not present

## 2024-05-10 DIAGNOSIS — G9389 Other specified disorders of brain: Secondary | ICD-10-CM | POA: Diagnosis not present

## 2024-05-10 DIAGNOSIS — R42 Dizziness and giddiness: Secondary | ICD-10-CM | POA: Diagnosis not present

## 2024-05-10 LAB — CBC WITH DIFFERENTIAL/PLATELET
Abs Immature Granulocytes: 0.05 K/uL (ref 0.00–0.07)
Basophils Absolute: 0.1 K/uL (ref 0.0–0.1)
Basophils Relative: 1 %
Eosinophils Absolute: 0.2 K/uL (ref 0.0–0.5)
Eosinophils Relative: 2 %
HCT: 37.8 % (ref 36.0–46.0)
Hemoglobin: 12.7 g/dL (ref 12.0–15.0)
Immature Granulocytes: 0 %
Lymphocytes Relative: 24 %
Lymphs Abs: 2.8 K/uL (ref 0.7–4.0)
MCH: 28.9 pg (ref 26.0–34.0)
MCHC: 33.6 g/dL (ref 30.0–36.0)
MCV: 86.1 fL (ref 80.0–100.0)
Monocytes Absolute: 0.6 K/uL (ref 0.1–1.0)
Monocytes Relative: 6 %
Neutro Abs: 7.8 K/uL — ABNORMAL HIGH (ref 1.7–7.7)
Neutrophils Relative %: 67 %
Platelets: 276 K/uL (ref 150–400)
RBC: 4.39 MIL/uL (ref 3.87–5.11)
RDW: 13.8 % (ref 11.5–15.5)
WBC: 11.6 K/uL — ABNORMAL HIGH (ref 4.0–10.5)
nRBC: 0 % (ref 0.0–0.2)

## 2024-05-10 LAB — COMPREHENSIVE METABOLIC PANEL WITH GFR
ALT: 8 U/L (ref 0–44)
AST: 16 U/L (ref 15–41)
Albumin: 4.4 g/dL (ref 3.5–5.0)
Alkaline Phosphatase: 79 U/L (ref 38–126)
Anion gap: 12 (ref 5–15)
BUN: 19 mg/dL (ref 8–23)
CO2: 25 mmol/L (ref 22–32)
Calcium: 9.5 mg/dL (ref 8.9–10.3)
Chloride: 103 mmol/L (ref 98–111)
Creatinine, Ser: 0.85 mg/dL (ref 0.44–1.00)
GFR, Estimated: >60 mL/min (ref >60)
Glucose, Bld: 105 mg/dL — ABNORMAL HIGH (ref 70–99)
Potassium: 3.9 mmol/L (ref 3.5–5.1)
Sodium: 140 mmol/L (ref 135–145)
Total Bilirubin: 0.4 mg/dL (ref 0.0–1.2)
Total Protein: 7.1 g/dL (ref 6.5–8.1)

## 2024-05-10 LAB — TROPONIN T, HIGH SENSITIVITY: Troponin T High Sensitivity: 16 ng/L (ref 0–19)

## 2024-05-10 NOTE — Discharge Instructions (Addendum)
 Advised patient to go to [facility] ED now for further evaluation of elevated blood pressure and headache.

## 2024-05-10 NOTE — ED Notes (Signed)
 Pt ambulatory to bathroom with personal rolling walker, standby assistance.

## 2024-05-10 NOTE — ED Notes (Signed)
 Patient is being discharged from the Urgent Care and sent to the Emergency Department via pov with daughter . Per ragan np, patient is in need of higher level of care due to headaches with hypertension. Patient is aware and verbalizes understanding of plan of care.  Vitals:   05/10/24 1146 05/10/24 1148  BP: (!) 197/78 (!) 196/82  Pulse:    Resp:    Temp:    SpO2:

## 2024-05-10 NOTE — ED Triage Notes (Signed)
 Pt c/o pain to top of head x 2d; she was seen at [facility] for this and her BP was noted to be elevated; referred here

## 2024-05-10 NOTE — ED Notes (Signed)
 PIV start attempted x2, unsuccessful.  2nd RN to attempt PIV insertion and blood collection.

## 2024-05-10 NOTE — ED Provider Notes (Signed)
 [facility]    CSN: [id number] Arrival date & time: 05/10/24  1130      History   Chief Complaint Chief Complaint  Patient presents with   Headache    HPI [name] is a 83 y.o. female.   HPI 83 year old female presents with headache x 2 days.  Reports stopping her amlodipine  1 month ago.  PMH significant for T2DM with diabetic neuropathy, HTN, and CKD stage III.  Blood pressure in triage is 196/82, 197/78, and 196/98  Past Medical History:  Diagnosis Date   Arthritis    back; left hip; knees (12/04/2013)   Asthma    Chronic bronchitis (HCC)    usually get it twice/yr (12/04/2013)   Diabetes mellitus without complication (HCC)    Environmental and seasonal allergies    GERD (gastroesophageal reflux disease)    High cholesterol    Hypertension    pcp   [facility]      Pneumonia    as a child    Patient Active Problem List   Diagnosis Date Noted   ARB intolerance 01/31/2023   Asymptomatic PVCs 05/02/2022   Paroxysmal SVT (supraventricular tachycardia) (HCC) 05/02/2022   Type 2 diabetes mellitus with diabetic neuropathy, unspecified (HCC) 04/15/2021   DDD (degenerative disc disease), cervical 03/03/2020   BMI 26.0-26.9,adult 05/01/2019   CKD stage G3a/A2, GFR 45-59 and albumin creatinine ratio 30-299 mg/g (HCC) 03/30/2019   Primary open angle glaucoma (POAG) of left eye, mild stage 11/05/2018   Combined forms of age-related cataract of right eye 12/31/2014   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   Presence of orthopedic joint implant 12/03/2013   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) 01/18/2012   DDD (degenerative disc disease), lumbar 01/05/2012   Osteopenia 07/18/2010   Asthma in adult 07/18/2010   Backache 11/01/2009   Anemia, unspecified 09/14/2009   Vitamin D deficiency 09/02/2009   Hyperlipidemia 09/02/2009   Essential hypertension 09/02/2009   Allergic rhinitis 09/02/2009   REFLUX ESOPHAGITIS 09/02/2009    URINARY INCONTINENCE, 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    Past Surgical History:  Procedure Laterality Date   ABDOMINAL HYSTERECTOMY     BLADDER SUSPENSION     COLONOSCOPY     JOINT REPLACEMENT     SHOULDER ARTHROSCOPY W/ ROTATOR CUFF REPAIR Right    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   Procedure: TOTAL HIP ARTHROPLASTY;  Surgeon: Jerona LULLA Sage, MD;  Location: [facility] OR;  Service: Orthopedics;  Laterality: Left;  Left Total Hip Arthroplasty   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   Procedure: TOTAL KNEE ARTHROPLASTY;  Surgeon: Jerona LULLA Sage, MD;  Location: [facility] OR;  Service: Orthopedics;  Laterality: Right;  Right Total Knee Arthroplasty    OB History   No obstetric history on file.      Home Medications    Prior to Admission medications   Medication Sig Start Date End Date Taking? Authorizing Provider  amLODipine  (NORVASC ) 2.5 MG tablet TAKE 1 TABLET(2.5 MG) BY MOUTH DAILY 08/02/22  Yes Alvan Dorothyann BIRCH, MD  Accu-Chek FastClix Lancets MISC For checking blood sugars up to 3 times daily. E11.49 01/31/23   Alvan Dorothyann BIRCH, MD  ACCU-CHEK GUIDE test strip Test twice daily.  DX: E11.49 01/31/23   Alvan Dorothyann BIRCH, MD  aspirin  81 MG chewable tablet Chew 1 tablet by mouth daily. 07/03/18   [provider]  blood glucose meter kit and supplies Dispense based on patient and insurance preference. For  testing up to twice daily. E11.49 05/01/19   Alvan Dorothyann BIRCH, MD  carvedilol  (COREG ) 6.25 MG tablet TAKE 1 TABLET(6.25 MG) BY MOUTH TWICE DAILY WITH A MEAL 12/20/23   Alvan Dorothyann BIRCH, MD  dorzolamide (TRUSOPT) 2 % ophthalmic solution 1 drop 2 (two) times daily.    [provider]  gabapentin  (NEURONTIN ) 300 MG capsule TAKE 1 CAPSULE(300 MG) BY MOUTH TWICE DAILY 12/20/23   Alvan Dorothyann BIRCH, MD  hydrochlorothiazide  (HYDRODIURIL ) 12.5 MG tablet TAKE 1 TABLET(12.5 MG) BY MOUTH DAILY 02/21/24   Alvan Dorothyann BIRCH, MD   ipratropium-albuterol  (DUONEB) 0.5-2.5 (3) MG/3ML SOLN Take 3 mLs by nebulization every 6 (six) hours as needed. 06/04/23   Alvan Dorothyann BIRCH, MD  JARDIANCE  10 MG TABS tablet TAKE 1 TABLET(10 MG) BY MOUTH DAILY BEFORE BREAKFAST 07/24/23   Alvan Dorothyann BIRCH, MD  metFORMIN  (GLUCOPHAGE ) 1000 MG tablet Take 1 tablet (1,000 mg total) by mouth 2 (two) times daily with a meal. 06/04/23   Alvan Dorothyann BIRCH, MD  rosuvastatin  (CRESTOR ) 20 MG tablet Take 1 tablet (20 mg total) by mouth daily. 10/11/23   Alvan Dorothyann BIRCH, MD    Family History Family History  Problem Relation Age of Onset   Hypertension Mother    Heart disease Mother    Transient ischemic attack Mother    CAD Father    Cancer - Ovarian Sister    Diabetes Daughter     Social History Social History   Tobacco Use   Smoking status: Never   Smokeless tobacco: Never  Vaping Use   Vaping status: Never Used  Substance Use Topics   Alcohol use: No   Drug use: No     Allergies   Losartan , Nsaids, Levofloxacin, Oxycodone  hcl, Verapamil, Biaxin [clarithromycin], Oxycontin  [oxycodone ], and Vicodin [hydrocodone -acetaminophen ]   Review of Systems Review of Systems  Neurological:  Positive for headaches.     Physical Exam Triage Vital Signs ED Triage Vitals  Encounter Vitals Group     BP 05/10/24 1144 (!) 196/98     Girls Systolic BP Percentile --      Girls Diastolic BP Percentile --      Boys Systolic BP Percentile --      Boys Diastolic BP Percentile --      Pulse Rate 05/10/24 1144 (!) 58     Resp 05/10/24 1144 19     Temp 05/10/24 1144 97.7 F (36.5 C)     Temp src --      SpO2 05/10/24 1144 97 %     Weight --      Height --      Head Circumference --      Peak Flow --      Pain Score 05/10/24 1143 2     Pain Loc --      Pain Education --      Exclude from Growth Chart --    No data found.  Updated Vital Signs BP (!) 196/82 (BP Location: Right Arm)   Pulse (!) 58   Temp 97.7 F (36.5 C)    Resp 19   SpO2 97%   Visual Acuity Right Eye Distance:   Left Eye Distance:   Bilateral Distance:    Right Eye Near:   Left Eye Near:    Bilateral Near:     Physical Exam Vitals and nursing note reviewed.  Constitutional:      General: She is not in acute distress.    Appearance: Normal appearance. She is normal  weight. She is not ill-appearing.  HENT:     Head: Normocephalic and atraumatic.     Right Ear: Tympanic membrane, ear canal and external ear normal.     Left Ear: Tympanic membrane, ear canal and external ear normal.     Mouth/Throat:     Mouth: Mucous membranes are moist.     Pharynx: Oropharynx is clear.  Eyes:     Extraocular Movements: Extraocular movements intact.     Pupils: Pupils are equal, round, and reactive to light.  Cardiovascular:     Rate and Rhythm: Normal rate and regular rhythm.     Pulses: Normal pulses.     Heart sounds: Normal heart sounds.  Pulmonary:     Effort: Pulmonary effort is normal.     Breath sounds: Normal breath sounds. No wheezing, rhonchi or rales.  Musculoskeletal:        General: Normal range of motion.     Cervical back: Normal range of motion and neck supple.  Skin:    General: Skin is warm and dry.  Neurological:     General: No focal deficit present.     Mental Status: She is alert and oriented to person, place, and time. Mental status is at baseline.     Cranial Nerves: No cranial nerve deficit.     Sensory: No sensory deficit.     Motor: No weakness.     Gait: Gait normal.  Psychiatric:        Mood and Affect: Mood normal.        Behavior: Behavior normal.      UC Treatments / Results  Labs (all labs ordered are listed, but only abnormal results are displayed) Labs Reviewed - No data to display  EKG   Radiology No results found.  Procedures Procedures (including critical care time)  Medications Ordered in UC Medications - No data to display  Initial Impression / Assessment and Plan / UC Course   I have reviewed the triage vital signs and the nursing notes.  Pertinent labs & imaging results that were available during my care of the patient were reviewed by me and considered in my medical decision making (see chart for details).     MDM: 1.  Hypertensive urgency-advised patient to go to [facility] ED now for further evaluation of elevated blood pressure. Patient agreed and verbalized understanding of these instructions and this plan of care today.  2.  Headache disorder-Advised patient to go to [facility] ED now for further evaluation of current headache.  Patient agreed and verbalized understanding of these instructions and this plan of care today.  Patient discharged to ED, hemodynamically stable. Final Clinical Impressions(s) / UC Diagnoses   Final diagnoses:  Hypertensive urgency  Headache disorder     Discharge Instructions      Advised patient to go to [facility] ED now for further evaluation of elevated blood pressure and headache.     ED Prescriptions   None    PDMP not reviewed this encounter.   Teddy Sharper, FNP 05/10/24 1206

## 2024-05-10 NOTE — Discharge Instructions (Signed)
 As we discussed, you can be discharged home. Please plan to follow up with your doctor next week for recheck. Measure your blood pressure twice a day at the same time everyday and use the form provided to keep a record of the measurements. This should be reviewed on your next visit with your doctor. You can restart your amlodipine  2.5 mg tablets.   Return to the ED with any new concerns at any time.

## 2024-05-10 NOTE — ED Triage Notes (Addendum)
 Pt presents to [facility] top of head feeling like a stinging sensation and achy since yesterday. Pt reports tylenol  with minimal improvement. Denies any pain with touch and no itching.   Bp at home was 140s here upper 190s systolic. Asked patient if she has been taking her bp medications reports about one month ago she stopped her amilodipine but has been taking the coreg  bid.

## 2024-05-10 NOTE — ED Provider Notes (Signed)
 [facility] Provider Note   CSN: [id number] Arrival date & time: 05/10/24  1244     Patient presents with: Headache and Hypertension   [name] is a 83 y.o. female.   Patient to ED from office of PCP for concern for hypertensive urgency. The appointment today was for evaluation of a left sided headache she started having yesterday. No history of headaches. She states she was a little lightheaded, but denies syncope. No recent falls or trauma. Note from PCP reviewed, and blood pressure was recorded as 196/82, 197/78, and 196/98. Of note, she endorses having stopped one of her blood pressure medications 2 months ago, she thinks it was amlodipine . No nausea, vomiting, chest pain, SOB, or peripheral edema. On arrival to the ED she reports her headache is much better. She also denies any visual changes, speech difficulty, imbalance, unilateral weakness. No recent cough, congestion or fever.   The history is provided by the patient and a relative. No language interpreter was used.  Headache Hypertension Associated symptoms include headaches.       Prior to Admission medications   Medication Sig Start Date End Date Taking? Authorizing Provider  Accu-Chek FastClix Lancets MISC For checking blood sugars up to 3 times daily. E11.49 01/31/23   Alvan Dorothyann BIRCH, MD  ACCU-CHEK GUIDE test strip Test twice daily.  DX: E11.49 01/31/23   Alvan Dorothyann BIRCH, MD  amLODipine  (NORVASC ) 2.5 MG tablet TAKE 1 TABLET(2.5 MG) BY MOUTH DAILY 08/02/22   Alvan Dorothyann BIRCH, MD  aspirin  81 MG chewable tablet Chew 1 tablet by mouth daily. 07/03/18   [provider]  blood glucose meter kit and supplies Dispense based on patient and insurance preference. For testing up to twice daily. E11.49 05/01/19   Alvan Dorothyann BIRCH, MD  carvedilol  (COREG ) 6.25 MG tablet TAKE 1 TABLET(6.25 MG) BY MOUTH TWICE DAILY WITH A MEAL 12/20/23   Alvan Dorothyann BIRCH,  MD  dorzolamide (TRUSOPT) 2 % ophthalmic solution 1 drop 2 (two) times daily.    [provider]  gabapentin  (NEURONTIN ) 300 MG capsule TAKE 1 CAPSULE(300 MG) BY MOUTH TWICE DAILY 12/20/23   Alvan Dorothyann BIRCH, MD  hydrochlorothiazide  (HYDRODIURIL ) 12.5 MG tablet TAKE 1 TABLET(12.5 MG) BY MOUTH DAILY 02/21/24   Alvan Dorothyann BIRCH, MD  ipratropium-albuterol  (DUONEB) 0.5-2.5 (3) MG/3ML SOLN Take 3 mLs by nebulization every 6 (six) hours as needed. 06/04/23   Alvan Dorothyann BIRCH, MD  JARDIANCE  10 MG TABS tablet TAKE 1 TABLET(10 MG) BY MOUTH DAILY BEFORE BREAKFAST 07/24/23   Alvan Dorothyann BIRCH, MD  metFORMIN  (GLUCOPHAGE ) 1000 MG tablet Take 1 tablet (1,000 mg total) by mouth 2 (two) times daily with a meal. 06/04/23   Alvan Dorothyann BIRCH, MD  rosuvastatin  (CRESTOR ) 20 MG tablet Take 1 tablet (20 mg total) by mouth daily. 10/11/23   Alvan Dorothyann BIRCH, MD    Allergies: Losartan , Nsaids, Levofloxacin, Oxycodone  hcl, Verapamil, Biaxin [clarithromycin], Oxycontin  [oxycodone ], and Vicodin [hydrocodone -acetaminophen ]    Review of Systems  Neurological:  Positive for headaches.    Updated Vital Signs BP (!) 189/75   Pulse (!) 57   Temp 98.1 F (36.7 C)   Resp 19   Ht 5' 5 (1.651 m)   Wt 70.8 kg   SpO2 98%   BMI 25.97 kg/m   Physical Exam Vitals and nursing note reviewed.  Constitutional:      Appearance: She is well-developed.  HENT:     Head: Normocephalic.  Cardiovascular:  Rate and Rhythm: Normal rate and regular rhythm.     Heart sounds: No murmur heard. Pulmonary:     Effort: Pulmonary effort is normal.     Breath sounds: Normal breath sounds. No wheezing, rhonchi or rales.  Abdominal:     General: Bowel sounds are normal.     Palpations: Abdomen is soft.     Tenderness: There is no abdominal tenderness. There is no guarding or rebound.  Musculoskeletal:        General: Normal range of motion.     Cervical back: Normal range of motion and neck supple.      Right lower leg: No edema.     Left lower leg: No edema.  Skin:    General: Skin is warm and dry.  Neurological:     General: No focal deficit present.     Mental Status: She is alert and oriented to person, place, and time.     GCS: GCS eye subscore is 4. GCS verbal subscore is 5. GCS motor subscore is 6.     Cranial Nerves: No cranial nerve deficit, dysarthria or facial asymmetry.     Sensory: No sensory deficit.     Coordination: Coordination normal.     Deep Tendon Reflexes: Reflexes normal.     (all labs ordered are listed, but only abnormal results are displayed) Labs Reviewed  CBC WITH DIFFERENTIAL/PLATELET - Abnormal; Notable for the following components:      Result Value   WBC 11.6 (*)    Neutro Abs 7.8 (*)    All other components within normal limits  COMPREHENSIVE METABOLIC PANEL WITH GFR - Abnormal; Notable for the following components:   Glucose, Bld 105 (*)    All other components within normal limits  TROPONIN T, HIGH SENSITIVITY  TROPONIN T, HIGH SENSITIVITY    EKG: None  Radiology: DG Chest Portable 1 View Result Date: 05/10/2024 CLINICAL DATA:  Dizziness with head discomfort. EXAM: PORTABLE CHEST 1 VIEW COMPARISON:  May 31, 2023 FINDINGS: The heart size and mediastinal contours are within normal limits. No acute infiltrate, pleural effusion or pneumothorax is identified. Multilevel degenerative changes seen throughout the thoracic spine. IMPRESSION: No active cardiopulmonary disease. Electronically Signed   By: Suzen Dials M.D.   On: 05/10/2024 15:52   CT Head Wo Contrast Result Date: 05/10/2024 CLINICAL DATA:  Pain to the top of head x2 days EXAM: CT HEAD WITHOUT CONTRAST TECHNIQUE: Contiguous axial images were obtained from the base of the skull through the vertex without intravenous contrast. RADIATION DOSE REDUCTION: This exam was performed according to the departmental dose-optimization program which includes automated exposure control,  adjustment of the mA and/or kV according to patient size and/or use of iterative reconstruction technique. COMPARISON:  None Available. FINDINGS: Brain: There is generalized cerebral atrophy with widening of the extra-axial spaces and ventricular dilatation. There are areas of decreased attenuation within the white matter tracts of the supratentorial brain, consistent with microvascular disease changes. 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 Vascular: Marked severity bilateral cavernous carotid artery calcification is noted. Skull: Normal. Negative for fracture or focal lesion. Sinuses/Orbits: No acute finding. Other: None. IMPRESSION: 1. Generalized cerebral atrophy with chronic white matter small vessel ischemic changes. 2. 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 3. No acute intracranial abnormality. Electronically Signed   By: Suzen Dials M.D.   On: 05/10/2024 15:50     Procedures   Medications Ordered in the ED - No data to display  Clinical Course as of 05/10/24 1714  Sat May 10, 2024  1519 Patient sent to ED for evaluation of uncontrolled blood pressure after stopping one of her medications months ago, in the setting of new onset unilateral headache. She is well appearing. BP here 183/72. Labs, EKG, imaging pending. [SU]  1544 EKG not crossing over in the MUSE, it was interpreted by me in the absence of cardiology shows sinus rhythm quite 61, slightly prolonged PR interval, LVH, and no STEMI or acute change when compared to prior EKG [MK]  1709 Patient continues to feel better in the ED. Blood pressure remains elevated but not critically elevated and without sequelae. Head  CT with acute findings, CXR clear. Labs reassuring, negative troponin, No evidence to suggest hypertensive emergency. Patient is directed to restart her 2.5 mg amlodipine  and have close follow up with her doctor after the weekend. Return precautions discussed.  [SU]    Clinical Course User  Index [MK] Kammerer, Duwaine CROME, DO [SU] Odell Balls, PA-C                                 Medical Decision Making Amount and/or Complexity of Data Reviewed Labs: ordered. Radiology: ordered.        Final diagnoses:  Hypertension, unspecified type    ED Discharge Orders     None          Odell Balls, PA-C 05/10/24 1714    Kammerer, Megan L, DO 05/11/24 1529

## 2024-05-13 ENCOUNTER — Encounter: Payer: Self-pay | Admitting: Family Medicine

## 2024-05-13 ENCOUNTER — Ambulatory Visit: Admitting: Family Medicine

## 2024-05-13 VITALS — BP 154/56 | HR 54 | Ht 65.0 in | Wt 155.0 lb

## 2024-05-13 DIAGNOSIS — R0981 Nasal congestion: Secondary | ICD-10-CM | POA: Diagnosis not present

## 2024-05-13 DIAGNOSIS — J301 Allergic rhinitis due to pollen: Secondary | ICD-10-CM

## 2024-05-13 DIAGNOSIS — I10 Essential (primary) hypertension: Secondary | ICD-10-CM

## 2024-05-13 MED ORDER — AMLODIPINE BESYLATE 5 MG PO TABS: 5.0000 mg | ORAL_TABLET | Freq: Every day | ORAL | 0 refills | Status: DC

## 2024-05-13 NOTE — Progress Notes (Signed)
 Established Patient Office Visit  Subjective  Patient ID: CLOVIS WARWICK, female    DOB: 01-10-1941  Age: 83 y.o. MRN: [id number]  Chief Complaint  Patient presents with   Follow-up    Pt was seen in the ED for elevated bp    HPI  Discussed the use of AI scribe software for clinical note transcription with the patient, who gave verbal consent to proceed.  History of Present Illness EMILLIE CHASEN is an 83 year old female with hypertension who presents for hospital follow-up after a hypertensive episodes  Hypertensive urgency and headache - Evaluated at [facility] Emergency Department at [facility] on August 23rd for hypertensive urgency - Presented with left-sided headache beginning the previous day, without significant prior history of headaches - Headache accompanied by lightheadedness - No syncope, trauma, or falls - Blood pressure recorded at 196/82 and 197/78 - Headache improved by the time of emergency department arrival - No visual changes or speech difficulties - Head CT without contrast showed generalized cerebral atrophy, chronic white matter small vessel ischemic changes, and a 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; no acute intracranial abnormalities - Since hospital visit, no recurrence of severe headache - Mild pressure sensation on the left side near the ear, now resolved  Blood pressure management - Previously discontinued amlodipine  a couple of months prior, believed it was expired - Current antihypertensive regimen: hydrochlorothiazide  12.5 mg and carvedilol  6.25 mg taken twice daily - Recent home blood pressure reading approximately 150/75 - No changes in diet, caffeine intake, or use of decongestants - No increased salt intake - Maintains good hydration  Sinus congestion - Sinus congestion began yesterday with HA above right eyebrow - Uses nasal spray for relief - no fever       ROS    Objective:     BP (!) 154/56    Pulse (!) 54   Ht 5' 5 (1.651 m)   Wt 155 lb 0.6 oz (70.3 kg)   SpO2 98%   BMI 25.80 kg/m     Physical Exam Constitutional:      Appearance: Normal appearance.  HENT:     Head: Normocephalic and atraumatic.     Right Ear: Tympanic membrane, ear canal and external ear normal. There is no impacted cerumen.     Left Ear: Tympanic membrane, ear canal and external ear normal. There is no impacted cerumen.     Nose: Nose normal.     Mouth/Throat:     Pharynx: Oropharynx is clear.  Eyes:     Conjunctiva/sclera: Conjunctivae normal.  Cardiovascular:     Rate and Rhythm: Normal rate and regular rhythm.  Pulmonary:     Effort: Pulmonary effort is normal.     Breath sounds: Normal breath sounds.  Musculoskeletal:     Cervical back: Neck supple. No tenderness.  Lymphadenopathy:     Cervical: No cervical adenopathy.  Skin:    General: Skin is warm and dry.  Neurological:     Mental Status: She is alert and oriented to person, place, and time.  Psychiatric:        Mood and Affect: Mood normal.      No results found for any visits on 05/13/24.     The ASCVD Risk score (Arnett DK, et al., 2019) failed to calculate for the following reasons:   The 2019 ASCVD risk score is only valid for ages 90 to 7    Assessment & Plan:   Problem  List Items Addressed This Visit       Cardiovascular and Mediastinum   Essential hypertension - Primary   Relevant Medications   amLODipine  (NORVASC ) 5 MG tablet     Respiratory   Allergic rhinitis   Other Visit Diagnoses       Nasal congestion          Assessment and Plan Assessment & Plan Essential hypertension with  headache Hypertensive urgency evaluated in ED with left-sided headache. Blood pressure significantly elevated, improved with treatment. CT showed no acute intracranial abnormality but chronic small vessel ischemic changes and old right basal ganglia lacunar infarct. Current blood pressure remains elevated. - Increase  amlodipine  to 5 mg daily. - Recheck blood pressure at end of visit. - Schedule nurse visit in 2 weeks for blood pressure check. - Continue hydrochlorothiazide  12.5 mg daily and carvedilol  6.25 mg twice daily. - Monitor for adverse effects from medication adjustments. - Review hospital blood work for abnormalities.  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 No acute intracranial abnormalities. Old infarct with no recent event indication.  Allergic rhinitis Sinus congestion and pressure likely due to allergic rhinitis. Symptoms managed with nasal spray. - Continue nasal spray (e.g., Flonase , Nasonex). - Monitor symptoms for improvement or bacterial infection signs.  General Health Maintenance Flu shot season approaching. - Administer flu vaccine during next visit in September.    Return in about 2 weeks (around 05/27/2024) for Nurse BP Check.    Dorothyann Byars, MD

## 2024-05-14 MED ORDER — NALOXONE HCL 0.4 MG/ML IJ SOLN
INTRAMUSCULAR | Status: AC
  Filled 2024-05-14: qty 1

## 2024-05-22 ENCOUNTER — Other Ambulatory Visit: Payer: Self-pay | Admitting: Family Medicine

## 2024-05-22 DIAGNOSIS — I10 Essential (primary) hypertension: Secondary | ICD-10-CM

## 2024-05-22 DIAGNOSIS — N1831 Chronic kidney disease, stage 3a: Secondary | ICD-10-CM

## 2024-05-22 DIAGNOSIS — E1149 Type 2 diabetes mellitus with other diabetic neurological complication: Secondary | ICD-10-CM

## 2024-05-28 ENCOUNTER — Ambulatory Visit

## 2024-05-29 ENCOUNTER — Ambulatory Visit (INDEPENDENT_AMBULATORY_CARE_PROVIDER_SITE_OTHER)

## 2024-05-29 VITALS — BP 130/70 | HR 60

## 2024-05-29 DIAGNOSIS — I10 Essential (primary) hypertension: Secondary | ICD-10-CM

## 2024-05-29 NOTE — Patient Instructions (Signed)
 Blood pressure looks great today continue on current medications and continue to keep a log of blood pressures , follow up with pcp in 3 months.

## 2024-05-29 NOTE — Progress Notes (Signed)
 Pt presents in office for blood pressure  recheck last visit pt had elevated blood pressure readings, last bp was 154/56, to day he blood pressure reading is 130/70,pt states her home pressure readings are between 120-130 systolic and 60-70 diastolic

## 2024-05-30 ENCOUNTER — Ambulatory Visit: Admitting: Podiatry

## 2024-06-09 ENCOUNTER — Encounter: Payer: Self-pay | Admitting: Family Medicine

## 2024-06-09 ENCOUNTER — Ambulatory Visit: Admitting: Family Medicine

## 2024-06-09 VITALS — BP 127/55 | HR 50 | Ht 65.0 in | Wt 159.0 lb

## 2024-06-09 DIAGNOSIS — I10 Essential (primary) hypertension: Secondary | ICD-10-CM | POA: Diagnosis not present

## 2024-06-09 DIAGNOSIS — Z23 Encounter for immunization: Secondary | ICD-10-CM | POA: Diagnosis not present

## 2024-06-09 DIAGNOSIS — Z7984 Long term (current) use of oral hypoglycemic drugs: Secondary | ICD-10-CM | POA: Diagnosis not present

## 2024-06-09 DIAGNOSIS — E1149 Type 2 diabetes mellitus with other diabetic neurological complication: Secondary | ICD-10-CM

## 2024-06-09 LAB — POCT GLYCOSYLATED HEMOGLOBIN (HGB A1C): Hemoglobin A1C: 6.2 % — AB (ref 4.0–5.6)

## 2024-06-09 NOTE — Assessment & Plan Note (Signed)
 BP looks wonderful today. Very stable on new regimen. Labs up to date.

## 2024-06-09 NOTE — Progress Notes (Signed)
 Established Patient Office Visit  Subjective  Patient ID: [name], female    DOB: 04/25/1941  Age: 83 y.o. MRN: [id number]  Chief Complaint  Patient presents with   Diabetes    HPI  Diabetes - no hypoglycemic events. No wounds or sores that are not healing well. No increased thirst or urination. Checking glucose at home. Taking medications as prescribed without any side effects.  Discussed the use of AI scribe software for clinical note transcription with the patient, who gave verbal consent to proceed.  History of Present Illness An 83 year old female with hypertension and type 2 diabetes presents for a follow-up visit.  Hypertension - Blood pressure stable with home readings around 138/71 mmHg - Most readings under 140/85 mmHg over the past two weeks - No significant fluctuations in blood pressure  Type 2 diabetes mellitus - Hemoglobin A1c is 6.2%, improved from 6.3% four months ago - Continues to take Jardiance  and metformin  for glycemic control - Insurance covers both medications; Jardiance  costs approximately $80 for a 90-day supply  Cardiopulmonary symptoms - No chest pain - No shortness of breath  Immunization status - Received influenza vaccination - Attempted to receive tetanus vaccination at pharmacy, but it was unavailable     ROS    Objective:     BP (!) 127/55   Pulse (!) 50   Ht 5' 5 (1.651 m)   Wt 159 lb (72.1 kg)   SpO2 93%   BMI 26.46 kg/m    Physical Exam Vitals and nursing note reviewed.  Constitutional:      Appearance: Normal appearance.  HENT:     Head: Normocephalic and atraumatic.  Eyes:     Conjunctiva/sclera: Conjunctivae normal.  Cardiovascular:     Rate and Rhythm: Normal rate and regular rhythm.  Pulmonary:     Effort: Pulmonary effort is normal.     Breath sounds: Normal breath sounds.  Skin:    General: Skin is warm and dry.  Neurological:     Mental Status: She is alert.  Psychiatric:        Mood and  Affect: Mood normal.      Results for orders placed or performed in visit on 06/09/24  POCT HgB A1C  Result Value Ref Range   Hemoglobin A1C 6.2 (A) 4.0 - 5.6 %   HbA1c POC (<> result, manual entry)     HbA1c, POC (prediabetic range)     HbA1c, POC (controlled diabetic range)        The ASCVD Risk score (Arnett DK, et al., 2019) failed to calculate for the following reasons:   The 2019 ASCVD risk score is only valid for ages 32 to 68    Assessment & Plan:   Problem List Items Addressed This Visit       Cardiovascular and Mediastinum   Essential hypertension   BP looks wonderful today. Very stable on new regimen. Labs up to date.         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 ratio   Other Visit Diagnoses       Encounter for immunization       Relevant Orders   Flu vaccine HIGH DOSE PF(Fluzone Trivalent) (Completed)       Assessment and Plan Assessment & Plan Type 2 diabetes mellitus Hemoglobin A1c improved to 6.2%, indicating good glycemic control. Current  medications are effective with no insurance issues. - Continue Jardiance  and metformin . - Monitor insurance coverage for Jardiance  and consider alternatives if costs become prohibitive. - Perform annual urine test for proteinuria.  Essential hypertension Blood pressure well-controlled with current medication regimen. - Continue Hydrochlorothiazide  12.5 mg twice daily and Carvedilol  6.25 mg twice daily.  General Health Maintenance Received flu vaccination. Tetanus vaccination due but not obtained at pharmacy. - Advise obtaining tetanus vaccination at med center pharmacy on [address] or [address] near [employer]  Labs up to date.   Return in about 4 months (around 10/09/2024) for Diabetes follow-up, Hypertension.    Dorothyann Byars, MD

## 2024-06-10 LAB — MICROALBUMIN / CREATININE URINE RATIO
Creatinine, Urine: 49.5 mg/dL
Microalb/Creat Ratio: 57 mg/g{creat} — ABNORMAL HIGH (ref 0–29)
Microalbumin, Urine: 28.3 ug/mL

## 2024-06-10 LAB — SPECIMEN STATUS REPORT

## 2024-06-11 ENCOUNTER — Ambulatory Visit: Payer: Self-pay | Admitting: Family Medicine

## 2024-06-11 ENCOUNTER — Encounter: Payer: Self-pay | Admitting: Family Medicine

## 2024-06-11 NOTE — Progress Notes (Signed)
 Hi [name], there is a little excess protein spilling into the urine.  So I do want a keep a close eye on this and plan to recheck again in 6 months.

## 2024-06-19 ENCOUNTER — Other Ambulatory Visit: Payer: Self-pay | Admitting: Family Medicine

## 2024-06-19 ENCOUNTER — Other Ambulatory Visit: Payer: Self-pay | Admitting: *Deleted

## 2024-06-19 DIAGNOSIS — E1149 Type 2 diabetes mellitus with other diabetic neurological complication: Secondary | ICD-10-CM

## 2024-06-19 DIAGNOSIS — I10 Essential (primary) hypertension: Secondary | ICD-10-CM

## 2024-06-19 MED ORDER — METFORMIN HCL 1000 MG PO TABS: 1000.0000 mg | ORAL_TABLET | Freq: Two times a day (BID) | ORAL | 3 refills | Status: AC

## 2024-06-19 MED ORDER — EMPAGLIFLOZIN 10 MG PO TABS: 10.0000 mg | ORAL_TABLET | Freq: Every day | ORAL | 3 refills | Status: AC

## 2024-06-23 NOTE — Progress Notes (Signed)
 [name]                                          MRN: [id number]   06/23/2024   The [facility] Quality Team Specialist reviewed this patient medical record for the purposes of chart review for care gap closure. The following were reviewed: abstraction for care gap closure-kidney health evaluation for diabetes:eGFR  and uACR.    [facility] Quality Team

## 2024-08-08 ENCOUNTER — Other Ambulatory Visit: Payer: Self-pay | Admitting: Family Medicine

## 2024-08-08 DIAGNOSIS — I10 Essential (primary) hypertension: Secondary | ICD-10-CM

## 2024-08-20 ENCOUNTER — Other Ambulatory Visit: Payer: Self-pay | Admitting: Family Medicine

## 2024-08-20 DIAGNOSIS — N1831 Chronic kidney disease, stage 3a: Secondary | ICD-10-CM

## 2024-08-20 DIAGNOSIS — E1149 Type 2 diabetes mellitus with other diabetic neurological complication: Secondary | ICD-10-CM

## 2024-08-20 DIAGNOSIS — I10 Essential (primary) hypertension: Secondary | ICD-10-CM

## 2024-10-08 ENCOUNTER — Encounter: Payer: Self-pay | Admitting: Family Medicine

## 2024-10-08 ENCOUNTER — Ambulatory Visit: Admitting: Family Medicine

## 2024-10-08 VITALS — BP 132/62 | HR 49 | Ht 65.0 in | Wt 159.0 lb

## 2024-10-08 DIAGNOSIS — E1122 Type 2 diabetes mellitus with diabetic chronic kidney disease: Secondary | ICD-10-CM

## 2024-10-08 DIAGNOSIS — Z888 Allergy status to other drugs, medicaments and biological substances status: Secondary | ICD-10-CM | POA: Diagnosis not present

## 2024-10-08 DIAGNOSIS — R0981 Nasal congestion: Secondary | ICD-10-CM | POA: Diagnosis not present

## 2024-10-08 DIAGNOSIS — Z7984 Long term (current) use of oral hypoglycemic drugs: Secondary | ICD-10-CM

## 2024-10-08 DIAGNOSIS — E1149 Type 2 diabetes mellitus with other diabetic neurological complication: Secondary | ICD-10-CM

## 2024-10-08 DIAGNOSIS — N1831 Chronic kidney disease, stage 3a: Secondary | ICD-10-CM | POA: Diagnosis not present

## 2024-10-08 DIAGNOSIS — I10 Essential (primary) hypertension: Secondary | ICD-10-CM

## 2024-10-08 DIAGNOSIS — E785 Hyperlipidemia, unspecified: Secondary | ICD-10-CM | POA: Diagnosis not present

## 2024-10-08 LAB — POCT GLYCOSYLATED HEMOGLOBIN (HGB A1C): Hemoglobin A1C: 6.6 % — AB (ref 4.0–5.6)

## 2024-10-08 MED ORDER — AMLODIPINE BESYLATE 5 MG PO TABS
5.0000 mg | ORAL_TABLET | Freq: Every day | ORAL | 3 refills | Status: AC
  Filled 2024-10-16: qty 90, 90d supply, fill #0

## 2024-10-08 MED ORDER — FLUTICASONE PROPIONATE 50 MCG/ACT NA SUSP: 1.0000 | Freq: Every day | NASAL | 3 refills | Status: AC

## 2024-10-08 MED ORDER — ROSUVASTATIN CALCIUM 20 MG PO TABS
20.0000 mg | ORAL_TABLET | Freq: Every day | ORAL | 3 refills | Status: AC
  Filled 2024-10-16: qty 90, 90d supply, fill #0

## 2024-10-08 NOTE — Progress Notes (Signed)
 "  Established Patient Office Visit  Patient ID: [name], female    DOB: Jun 15, 1941  Age: 84 y.o. MRN: [id number] PCP: Alvan Dorothyann BIRCH, MD  Chief Complaint  Patient presents with   Diabetes   Hypertension    Subjective:     HPI  Discussed the use of AI scribe software for clinical note transcription with the patient, who gave verbal consent to proceed.  History of Present Illness [name] is an 84 year old female with diabetes who presents for a follow-up visit regarding her diabetes management and neuropathy symptoms.  Peripheral neuropathy - Discomfort in feet , worse for the past two days, primarily affecting toes and lateral aspect of foot - Sensation described as shooting pain - Topical creams used for symptom relief - Gabapentin  provides intermittent benefit; concerned about sedative effects with dose escalation - Neuropathy impairs ability to drive long distances, occasionally unsure if foot is on pedal - Relies on children for transportation due to neuropathy-related safety concerns  Diabetes mellitus management - Diabetes managed with Jardiance  and metformin  - Recent refill of Jardiance  at significantly higher cost - Hemoglobin A1c increased from 6.2 to 6.6 since the fall - Recent steroid injection administered for orthopedic issues  Sinus congestion - Sinus congestion present, especially in the mornings - Previously prescribed generic Flonase  nasal spray over a year ago - Nasal spray used infrequently  Blood pressure monitoring - Monitors blood pressure at home - Blood pressure readings not elevated, exact values not recalled - No recent chest pain or dyspnea     ROS    Objective:     BP 132/62   Pulse (!) 49   Ht 5' 5 (1.651 m)   Wt 159 lb (72.1 kg)   SpO2 94%   BMI 26.46 kg/m    Physical Exam Vitals and nursing note reviewed.  Constitutional:      Appearance: Normal appearance.  HENT:     Head: Normocephalic and  atraumatic.  Eyes:     Conjunctiva/sclera: Conjunctivae normal.  Cardiovascular:     Rate and Rhythm: Normal rate and regular rhythm.  Pulmonary:     Effort: Pulmonary effort is normal.     Breath sounds: Normal breath sounds.  Skin:    General: Skin is warm and dry.  Neurological:     Mental Status: She is alert.  Psychiatric:        Mood and Affect: Mood normal.      Results for orders placed or performed in visit on 10/08/24  POCT HgB A1C  Result Value Ref Range   Hemoglobin A1C 6.6 (A) 4.0 - 5.6 %   HbA1c POC (<> result, manual entry)     HbA1c, POC (prediabetic range)     HbA1c, POC (controlled diabetic range)        The ASCVD Risk score (Arnett DK, et al., 2019) failed to calculate for the following reasons:   The 2019 ASCVD risk score is only valid for ages 76 to 61   * - Cholesterol units were assumed    Assessment & Plan:   Problem List Items Addressed This Visit       Cardiovascular and Mediastinum   Essential hypertension - Primary   Essential hypertension Home and in-office blood pressure readings are satisfactory.      Relevant Medications   amLODipine  (NORVASC ) 5 MG tablet   rosuvastatin  (CRESTOR ) 20 MG tablet     Endocrine   Type 2 diabetes mellitus with neurological  manifestations (HCC)   Type 2 diabetes mellitus with diabetic neuropathy A1c increased from 6.2% to 6.6%, likely due to holiday indulgence and steroid injection. Diabetic neuropathy with persistent foot pain, partially relieved by gabapentin . - Continue gabapentin . - Encouraged dietary modifications for blood glucose management. - Ordered blood work for diabetes monitoring. - Not on ACE or ARB due to allergy. -Continue daily statin.      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 Recent labs  with stable renal function Not on ACE or ARB due to allergy.        Other   Hyperlipidemia   Continue daily statin currently on Crestor  20 mg.  Refilled prescription for 1 year.      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 - Prescribed fluticasone  nasal spray with two refills.    Return in about 4 months (around 02/05/2025) for Diabetes follow-up, Hypertension.    Dorothyann Byars, MD [facility] at [facility]   "

## 2024-10-08 NOTE — Assessment & Plan Note (Addendum)
 Continue Jardiance . Recent labs with stable renal function Not on ACE or ARB due to allergy.

## 2024-10-08 NOTE — Assessment & Plan Note (Addendum)
 Type 2 diabetes mellitus with diabetic neuropathy A1c increased from 6.2% to 6.6%, likely due to holiday indulgence and steroid injection. Diabetic neuropathy with persistent foot pain, partially relieved by gabapentin . - Continue gabapentin . - Encouraged dietary modifications for blood glucose management. - Ordered blood work for diabetes monitoring. - Not on ACE or ARB due to allergy. -Continue daily statin.

## 2024-10-08 NOTE — Assessment & Plan Note (Signed)
 Continue daily statin currently on Crestor  20 mg.  Refilled prescription for 1 year.

## 2024-10-08 NOTE — Assessment & Plan Note (Signed)
 Essential hypertension Home and in-office blood pressure readings are satisfactory.

## 2024-10-14 ENCOUNTER — Ambulatory Visit: Payer: Self-pay

## 2024-10-14 NOTE — Telephone Encounter (Signed)
 FYI Only or Action Required?: Action required by provider: clinical question for provider and update on patient condition.  Patient was last seen in primary care on 10/08/2024 by Alvan Dorothyann BIRCH, MD.  Called Nurse Triage reporting Sinusitis and Nasal Congestion.  Symptoms began several days ago.  Interventions attempted: OTC medications: Flonase , Tylenol .  Symptoms are: unchanged.  Triage Disposition: See PCP When Office is Open (Within 3 Days)  Patient/caregiver understands and will follow disposition?: No, wishes to speak with PCP             Copied from CRM #[id number]. Topic: Clinical - Medical Advice >> Oct 14, 2024  8:02 AM Olam RAMAN wrote: Reason for CRM: Pt would like provider to call in medication for her head cold/sinuses. Has been using nasal spray, tylenol  and has no temp. Maybe a cough medicine. Pt stated Codine guaifen syrup 10-100 mg 5ml Reason for Disposition  Lots of coughing  Answer Assessment - Initial Assessment Questions 1. LOCATION: Where does it hurt?      Over the left eye.  2. ONSET: When did the sinus pain start?  (e.g., hours, days)      Saturday.  3. SEVERITY: How bad is the pain?   (Scale 0-10; or none, mild, moderate or severe)     Mild.  4. RECURRENT SYMPTOM: Have you ever had sinus problems before? If Yes, ask: When was the last time? and What happened that time?      Yes, its been over a year.  5. NASAL CONGESTION: Is the nose blocked? If Yes, ask: Can you open it or must you breathe through your mouth?     Yes, in the morning. She has been using her Flonase  which helps open it up.  6. NASAL DISCHARGE: Do you have discharge from your nose? If so ask, What color?     Yes, clear.  7. FEVER: Do you have a fever? If Yes, ask: What is it, how was it measured, and when did it start?      No.  8. OTHER SYMPTOMS: Do you have any other symptoms? (e.g., sore throat, cough, earache, difficulty breathing)     Cough,  post nasal drip, runny nose. No fever, difficulty breathing, earaches, dizziness.  Protocols used: Sinus Pain or Congestion-A-AH

## 2024-10-14 NOTE — Telephone Encounter (Signed)
 Doesn't have to have mychart. Just have a cell phone that has a camara and able to receive text

## 2024-10-15 ENCOUNTER — Encounter: Payer: Self-pay | Admitting: Family Medicine

## 2024-10-15 ENCOUNTER — Ambulatory Visit: Admitting: Family Medicine

## 2024-10-15 ENCOUNTER — Other Ambulatory Visit (HOSPITAL_BASED_OUTPATIENT_CLINIC_OR_DEPARTMENT_OTHER): Payer: Self-pay

## 2024-10-15 VITALS — BP 130/67 | HR 55 | Ht 65.0 in | Wt 156.0 lb

## 2024-10-15 DIAGNOSIS — R0981 Nasal congestion: Secondary | ICD-10-CM | POA: Diagnosis not present

## 2024-10-15 DIAGNOSIS — J3489 Other specified disorders of nose and nasal sinuses: Secondary | ICD-10-CM | POA: Diagnosis not present

## 2024-10-15 LAB — POC SOFIA 2 FLU + SARS ANTIGEN FIA
Influenza A, POC: NEGATIVE
Influenza B, POC: NEGATIVE
SARS Coronavirus 2 Ag: NEGATIVE

## 2024-10-15 MED ORDER — GUAIFENESIN-CODEINE 100-10 MG/5ML PO SOLN
5.0000 mL | Freq: Every evening | ORAL | 0 refills | Status: AC | PRN
  Filled 2024-10-15: qty 120, 24d supply, fill #0

## 2024-10-15 MED ORDER — PREDNISONE 20 MG PO TABS
40.0000 mg | ORAL_TABLET | Freq: Every day | ORAL | 0 refills | Status: AC
  Filled 2024-10-15: qty 10, 5d supply, fill #0

## 2024-10-15 NOTE — Progress Notes (Signed)
 "  Acute Office Visit  Patient ID: [name], female    DOB: 07-16-1941, 84 y.o.   MRN: [id number]  PCP: Alvan Dorothyann BIRCH, MD  Chief Complaint  Patient presents with   Sinusitis    Pt reports that her sxs began 4 days ago. She has pain and pressure over and under her L eye, runny nose (clear mucus), no fevers, she has been using flonase .    Subjective:     HPI  Discussed the use of AI scribe software for clinical note transcription with the patient, who gave verbal consent to proceed.  History of Present Illness [name] is an 84 year old female who presents with cough, nasal congestion, and eye irritation.  Upper respiratory symptoms - Nasal congestion and rhinorrhea began four days ago - Persistent cough, worse at night, interfering with sleep - Cough is sometimes productive with phlegm - No fever; temperature monitored regularly - No sore throat - No ear pain or changes in hearing  Ocular symptoms - Irritation and watering of the left eye - Pain localized over the left eyebrow  Gastrointestinal symptoms - No stomach upset - No loose bowels  Exposure history - Staying indoors to avoid spreading or contracting illness - No known sick contacts  Symptom management - Using nasal spray and Tylenol  for symptom relief   ROS     Objective:    BP 130/67   Pulse (!) 55   Ht 5' 5 (1.651 m)   Wt 156 lb (70.8 kg)   SpO2 100%   BMI 25.96 kg/m    Physical Exam Constitutional:      Appearance: Normal appearance.  HENT:     Head: Normocephalic and atraumatic.     Right Ear: Tympanic membrane, ear canal and external ear normal. There is no impacted cerumen.     Left Ear: Tympanic membrane, ear canal and external ear normal. There is no impacted cerumen.     Nose: Nose normal.     Mouth/Throat:     Pharynx: Oropharynx is clear.  Eyes:     Conjunctiva/sclera: Conjunctivae normal.  Cardiovascular:     Rate and Rhythm: Normal rate and regular  rhythm.  Pulmonary:     Effort: Pulmonary effort is normal.     Comments: Coarse BS bilaterally Musculoskeletal:     Cervical back: Neck supple. No tenderness.  Lymphadenopathy:     Cervical: No cervical adenopathy.  Skin:    General: Skin is warm and dry.  Neurological:     Mental Status: She is alert and oriented to person, place, and time.  Psychiatric:        Mood and Affect: Mood normal.       Results for orders placed or performed in visit on 10/15/24  POC SOFIA 2 FLU + SARS ANTIGEN FIA  Result Value Ref Range   Influenza A, POC Negative Negative   Influenza B, POC Negative Negative   SARS Coronavirus 2 Ag Negative Negative       Assessment & Plan:   Problem List Items Addressed This Visit   None Visit Diagnoses       Nasal congestion    -  Primary   Relevant Orders   POC SOFIA 2 FLU + SARS ANTIGEN FIA (Completed)     Sinus pressure       Relevant Orders   POC SOFIA 2 FLU + SARS ANTIGEN FIA (Completed)       Assessment and Plan Assessment &  Plan Acute bronchitis Bronchitis with cough, nasal congestion, and phlegm. No fever or bacterial infection signs. Potential for bacterial progression if symptoms worsen. - Prescribed prednisone , reduce to one tablet if insomnia occurs. - Prescribed codeine  cough syrup with guaifenesin . - Advised possible antibiotics if symptoms worsen or persist. - Encouraged hydration. - Recommended humidifier use.  Disorders of nose and nasal sinuses Nasal congestion with rhinorrhea, no fever or significant ear/throat symptoms. - Continue nasal spray as needed.    Meds ordered this encounter  Medications   predniSONE  (DELTASONE ) 20 MG tablet    Sig: Take 2 tablets (40 mg total) by mouth daily with breakfast.    Dispense:  10 tablet    Refill:  0   guaiFENesin -codeine  100-10 MG/5ML syrup    Sig: Take 5 mLs by mouth at bedtime as needed for cough.    Dispense:  120 mL    Refill:  0    No follow-ups on file.  Dorothyann Byars, MD [facility] Primary [facility] & [facility] Medicine at [facility]   "

## 2024-10-15 NOTE — Telephone Encounter (Signed)
 Attempted to contact the patient for scheduling. No answer. Left a detailed vm msg for patient to return a call back for scheduling. Direct call back information provided.

## 2024-10-15 NOTE — Telephone Encounter (Signed)
 Patient called back, was able to secure a ride from her son. Scheduled in person OV with PCP.

## 2024-10-16 ENCOUNTER — Other Ambulatory Visit (HOSPITAL_BASED_OUTPATIENT_CLINIC_OR_DEPARTMENT_OTHER): Payer: Self-pay

## 2024-10-16 MED ORDER — FLUTICASONE PROPIONATE 50 MCG/ACT NA SUSP: 1.0000 | Freq: Every day | NASAL | 3 refills | Status: AC

## 2024-10-16 MED ORDER — EMPAGLIFLOZIN 10 MG PO TABS: 10.0000 mg | ORAL_TABLET | Freq: Every day | ORAL | 3 refills | Status: AC

## 2024-10-16 MED ORDER — DORZOLAMIDE HCL-TIMOLOL MAL 2-0.5 % OP SOLN
1.0000 [drp] | Freq: Two times a day (BID) | OPHTHALMIC | 2 refills | Status: AC
  Filled 2024-10-16: qty 10, 50d supply, fill #0

## 2024-10-16 MED ORDER — METFORMIN HCL 1000 MG PO TABS
1000.0000 mg | ORAL_TABLET | Freq: Two times a day (BID) | ORAL | 3 refills | Status: AC
  Filled 2024-10-16: qty 180, 90d supply, fill #0

## 2024-10-17 ENCOUNTER — Telehealth: Payer: Self-pay

## 2024-10-17 ENCOUNTER — Other Ambulatory Visit (HOSPITAL_BASED_OUTPATIENT_CLINIC_OR_DEPARTMENT_OTHER): Payer: Self-pay

## 2024-10-17 MED ORDER — AMOXICILLIN 875 MG PO TABS: 875.0000 mg | ORAL_TABLET | Freq: Two times a day (BID) | ORAL | 0 refills | Status: AC

## 2024-10-17 NOTE — Telephone Encounter (Signed)
 This task has been completed as requested. The patient was notified of the update. Aware the antibiotic has been sent to the pharmacy. No other inquiries during the call.
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
## 2024-10-23 ENCOUNTER — Ambulatory Visit: Admitting: Podiatry

## 2024-10-27 ENCOUNTER — Ambulatory Visit: Admitting: Podiatry

## 2025-02-04 ENCOUNTER — Ambulatory Visit: Admitting: Family Medicine
